# Patient Record
Sex: Female | Born: 1940
Health system: Southern US, Community
[De-identification: ages and names within clinical notes are randomized; demographics above are authoritative.]

## PROBLEM LIST (undated history)

## (undated) ENCOUNTER — Emergency Department (HOSPITAL_COMMUNITY): Admission: EM | Payer: Self-pay | Source: Home / Self Care

## (undated) DIAGNOSIS — I739 Peripheral vascular disease, unspecified: Secondary | ICD-10-CM

## (undated) DIAGNOSIS — I4819 Other persistent atrial fibrillation: Secondary | ICD-10-CM

## (undated) DIAGNOSIS — I340 Nonrheumatic mitral (valve) insufficiency: Secondary | ICD-10-CM

## (undated) DIAGNOSIS — E079 Disorder of thyroid, unspecified: Secondary | ICD-10-CM

## (undated) DIAGNOSIS — E785 Hyperlipidemia, unspecified: Secondary | ICD-10-CM

## (undated) DIAGNOSIS — I10 Essential (primary) hypertension: Secondary | ICD-10-CM

## (undated) DIAGNOSIS — R011 Cardiac murmur, unspecified: Secondary | ICD-10-CM

## (undated) DIAGNOSIS — E119 Type 2 diabetes mellitus without complications: Secondary | ICD-10-CM

## (undated) DIAGNOSIS — E039 Hypothyroidism, unspecified: Secondary | ICD-10-CM

## (undated) DIAGNOSIS — G8929 Other chronic pain: Secondary | ICD-10-CM

## (undated) DIAGNOSIS — E669 Obesity, unspecified: Secondary | ICD-10-CM

## (undated) DIAGNOSIS — I5032 Chronic diastolic (congestive) heart failure: Secondary | ICD-10-CM

## (undated) DIAGNOSIS — K219 Gastro-esophageal reflux disease without esophagitis: Secondary | ICD-10-CM

## (undated) DIAGNOSIS — M1990 Unspecified osteoarthritis, unspecified site: Secondary | ICD-10-CM

## (undated) DIAGNOSIS — G4733 Obstructive sleep apnea (adult) (pediatric): Secondary | ICD-10-CM

## (undated) DIAGNOSIS — Q211 Atrial septal defect, unspecified: Secondary | ICD-10-CM

## (undated) DIAGNOSIS — R079 Chest pain, unspecified: Secondary | ICD-10-CM

## (undated) DIAGNOSIS — I499 Cardiac arrhythmia, unspecified: Secondary | ICD-10-CM

## (undated) DIAGNOSIS — K279 Peptic ulcer, site unspecified, unspecified as acute or chronic, without hemorrhage or perforation: Secondary | ICD-10-CM

## (undated) DIAGNOSIS — I272 Pulmonary hypertension, unspecified: Secondary | ICD-10-CM

## (undated) DIAGNOSIS — I071 Rheumatic tricuspid insufficiency: Secondary | ICD-10-CM

## (undated) HISTORY — DX: Hyperlipidemia, unspecified: E78.5

## (undated) HISTORY — DX: Peripheral vascular disease, unspecified: I73.9

## (undated) HISTORY — DX: Essential (primary) hypertension: I10

## (undated) HISTORY — DX: Hypothyroidism, unspecified: E03.9

## (undated) HISTORY — PX: TUBAL LIGATION: SHX77

## (undated) HISTORY — DX: Unspecified osteoarthritis, unspecified site: M19.90

## (undated) HISTORY — DX: Other persistent atrial fibrillation: I48.19

## (undated) HISTORY — DX: Rheumatic tricuspid insufficiency: I07.1

## (undated) HISTORY — DX: Type 2 diabetes mellitus without complications: E11.9

## (undated) HISTORY — DX: Other chronic pain: G89.29

## (undated) HISTORY — DX: Obstructive sleep apnea (adult) (pediatric): G47.33

## (undated) HISTORY — DX: Obesity, unspecified: E66.9

## (undated) HISTORY — DX: Chest pain, unspecified: R07.9

## (undated) HISTORY — DX: Nonrheumatic mitral (valve) insufficiency: I34.0

## (undated) HISTORY — PX: TOTAL KNEE ARTHROPLASTY: SHX125

## (undated) HISTORY — DX: Cardiac murmur, unspecified: R01.1

## (undated) HISTORY — DX: Chronic diastolic (congestive) heart failure: I50.32

## (undated) HISTORY — DX: Pulmonary hypertension, unspecified: I27.20

## (undated) HISTORY — DX: Atrial septal defect, unspecified: Q21.10

---
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
## 2004-09-03 ENCOUNTER — Ambulatory Visit (HOSPITAL_COMMUNITY): Admission: RE | Admit: 2004-09-03 | Discharge: 2004-09-03 | Payer: Self-pay | Admitting: Internal Medicine

## 2006-03-02 ENCOUNTER — Ambulatory Visit (HOSPITAL_COMMUNITY): Admission: RE | Admit: 2006-03-02 | Discharge: 2006-03-02 | Payer: Self-pay | Admitting: *Deleted

## 2007-06-17 ENCOUNTER — Ambulatory Visit (HOSPITAL_COMMUNITY): Admission: RE | Admit: 2007-06-17 | Discharge: 2007-06-17 | Payer: Self-pay | Admitting: Family Medicine

## 2008-01-10 ENCOUNTER — Ambulatory Visit (HOSPITAL_COMMUNITY): Admission: RE | Admit: 2008-01-10 | Discharge: 2008-01-10 | Payer: Self-pay | Admitting: Internal Medicine

## 2011-10-17 DIAGNOSIS — E119 Type 2 diabetes mellitus without complications: Secondary | ICD-10-CM | POA: Diagnosis not present

## 2011-10-17 DIAGNOSIS — M159 Polyosteoarthritis, unspecified: Secondary | ICD-10-CM | POA: Diagnosis not present

## 2011-10-17 DIAGNOSIS — E669 Obesity, unspecified: Secondary | ICD-10-CM | POA: Diagnosis not present

## 2012-01-23 DIAGNOSIS — M159 Polyosteoarthritis, unspecified: Secondary | ICD-10-CM | POA: Diagnosis not present

## 2012-01-23 DIAGNOSIS — E119 Type 2 diabetes mellitus without complications: Secondary | ICD-10-CM | POA: Diagnosis not present

## 2012-01-23 DIAGNOSIS — Z6841 Body Mass Index (BMI) 40.0 and over, adult: Secondary | ICD-10-CM | POA: Diagnosis not present

## 2012-01-23 DIAGNOSIS — G8929 Other chronic pain: Secondary | ICD-10-CM | POA: Diagnosis not present

## 2012-01-27 DIAGNOSIS — H409 Unspecified glaucoma: Secondary | ICD-10-CM | POA: Diagnosis not present

## 2012-01-27 DIAGNOSIS — H4011X0 Primary open-angle glaucoma, stage unspecified: Secondary | ICD-10-CM | POA: Diagnosis not present

## 2012-02-18 DIAGNOSIS — G4730 Sleep apnea, unspecified: Secondary | ICD-10-CM | POA: Diagnosis not present

## 2012-02-23 DIAGNOSIS — M1710 Unilateral primary osteoarthritis, unspecified knee: Secondary | ICD-10-CM | POA: Diagnosis not present

## 2012-03-23 DIAGNOSIS — H2510 Age-related nuclear cataract, unspecified eye: Secondary | ICD-10-CM | POA: Diagnosis not present

## 2012-03-23 DIAGNOSIS — H4011X0 Primary open-angle glaucoma, stage unspecified: Secondary | ICD-10-CM | POA: Diagnosis not present

## 2012-03-23 DIAGNOSIS — E119 Type 2 diabetes mellitus without complications: Secondary | ICD-10-CM | POA: Diagnosis not present

## 2012-03-23 DIAGNOSIS — H409 Unspecified glaucoma: Secondary | ICD-10-CM | POA: Diagnosis not present

## 2012-04-22 DIAGNOSIS — I10 Essential (primary) hypertension: Secondary | ICD-10-CM | POA: Diagnosis not present

## 2012-04-22 DIAGNOSIS — G4733 Obstructive sleep apnea (adult) (pediatric): Secondary | ICD-10-CM | POA: Diagnosis not present

## 2012-04-22 DIAGNOSIS — E782 Mixed hyperlipidemia: Secondary | ICD-10-CM | POA: Diagnosis not present

## 2012-04-28 DIAGNOSIS — E119 Type 2 diabetes mellitus without complications: Secondary | ICD-10-CM | POA: Diagnosis not present

## 2012-05-11 DIAGNOSIS — H10509 Unspecified blepharoconjunctivitis, unspecified eye: Secondary | ICD-10-CM | POA: Diagnosis not present

## 2012-05-19 DIAGNOSIS — F411 Generalized anxiety disorder: Secondary | ICD-10-CM | POA: Diagnosis not present

## 2012-05-19 DIAGNOSIS — Z6841 Body Mass Index (BMI) 40.0 and over, adult: Secondary | ICD-10-CM | POA: Diagnosis not present

## 2012-05-19 DIAGNOSIS — M159 Polyosteoarthritis, unspecified: Secondary | ICD-10-CM | POA: Diagnosis not present

## 2012-05-19 DIAGNOSIS — G8929 Other chronic pain: Secondary | ICD-10-CM | POA: Diagnosis not present

## 2012-07-28 DIAGNOSIS — E785 Hyperlipidemia, unspecified: Secondary | ICD-10-CM | POA: Diagnosis not present

## 2012-07-28 DIAGNOSIS — E119 Type 2 diabetes mellitus without complications: Secondary | ICD-10-CM | POA: Diagnosis not present

## 2012-07-28 DIAGNOSIS — G8929 Other chronic pain: Secondary | ICD-10-CM | POA: Diagnosis not present

## 2012-07-28 DIAGNOSIS — M159 Polyosteoarthritis, unspecified: Secondary | ICD-10-CM | POA: Diagnosis not present

## 2012-07-28 DIAGNOSIS — Z6841 Body Mass Index (BMI) 40.0 and over, adult: Secondary | ICD-10-CM | POA: Diagnosis not present

## 2012-08-25 DIAGNOSIS — G4733 Obstructive sleep apnea (adult) (pediatric): Secondary | ICD-10-CM | POA: Diagnosis not present

## 2012-08-25 DIAGNOSIS — R269 Unspecified abnormalities of gait and mobility: Secondary | ICD-10-CM | POA: Diagnosis not present

## 2012-08-25 DIAGNOSIS — M159 Polyosteoarthritis, unspecified: Secondary | ICD-10-CM | POA: Diagnosis not present

## 2012-09-10 ENCOUNTER — Other Ambulatory Visit: Payer: Self-pay

## 2012-09-10 DIAGNOSIS — G4700 Insomnia, unspecified: Secondary | ICD-10-CM

## 2012-09-12 ENCOUNTER — Ambulatory Visit: Payer: Medicare Other | Attending: Neurology | Admitting: Sleep Medicine

## 2012-09-12 DIAGNOSIS — G4700 Insomnia, unspecified: Secondary | ICD-10-CM

## 2012-09-12 DIAGNOSIS — G4730 Sleep apnea, unspecified: Secondary | ICD-10-CM | POA: Diagnosis not present

## 2012-09-12 DIAGNOSIS — G4733 Obstructive sleep apnea (adult) (pediatric): Secondary | ICD-10-CM | POA: Diagnosis not present

## 2012-09-12 DIAGNOSIS — Z6841 Body Mass Index (BMI) 40.0 and over, adult: Secondary | ICD-10-CM | POA: Insufficient documentation

## 2012-09-16 NOTE — Procedures (Signed)
[facility] Ajax Schroll A. Gerilyn Pilgrim, MD     [email or web address]        NAME:  Christina Frey, Christina Frey               ACCOUNT NO.:  [id number]  MEDICAL RECORD NO.:  [id number]          PATIENT TYPE:  OUT  LOCATION:  SLEEP LAB                     FACILITY:  [facility]  PHYSICIAN:  Roen Macgowan A. Gerilyn Pilgrim, M.D. DATE OF BIRTH:  [birth year]  DATE OF STUDY:  09/12/2012                           NOCTURNAL POLYSOMNOGRAM  REFERRING PHYSICIAN:  Arzu Mcgaughey A. Gerilyn Pilgrim, M.D.  INDICATION:  This is a 72 year old lady who presents with snoring and fatigue.  The study is being done to evaluate for obstructive sleep apnea syndrome.   MEDICATIONS:  Levothyroxine, potassium, gabapentin, diazepam, enalapril, hydrocodone, Nexium, Vicodin, and Zetia.  EPWORTH SLEEPINESS SCALE:  6.  BMI:  43.  ARCHITECTURAL SUMMARY:  The total recording time is 415 minutes.  Sleep efficiency is 60%, sleep latency 12 minutes.  REM latency 360 minutes. Stage N1 -- 13%, N2 -- 74%, N3 -- 6%, and REM sleep 7%.  RESPIRATORY SUMMARY:  Baseline oxygen saturation is 93, lowest saturation 77 during REM sleep.  Diagnostic AHI is 6.5 and RDI 7.2.  LIMB MOVEMENT SUMMARY:  PLM index is 0.  ELECTROCARDIOGRAM SUMMARY:  Average heart rate is 62 with no significant dysrhythmias observed.  IMPRESSION: 1. Mild obstructive sleep apnea syndrome, not requiring positive     pressure treatment. 2. Abnormal architecture with reduced sleep efficiency, increased     stage II sleep, and reduced stage N3 sleep.    Railee Bonillas A. Gerilyn Pilgrim, M.D.    KAD/MEDQ  D:  09/16/2012 08:42:52  T:  09/16/2012 09:11:10  Job:  [id number]

## 2012-09-29 DIAGNOSIS — G478 Other sleep disorders: Secondary | ICD-10-CM | POA: Diagnosis not present

## 2012-11-18 DIAGNOSIS — Z6841 Body Mass Index (BMI) 40.0 and over, adult: Secondary | ICD-10-CM | POA: Diagnosis not present

## 2012-11-18 DIAGNOSIS — F411 Generalized anxiety disorder: Secondary | ICD-10-CM | POA: Diagnosis not present

## 2012-11-18 DIAGNOSIS — G8929 Other chronic pain: Secondary | ICD-10-CM | POA: Diagnosis not present

## 2012-11-29 DIAGNOSIS — M1710 Unilateral primary osteoarthritis, unspecified knee: Secondary | ICD-10-CM | POA: Diagnosis not present

## 2012-11-30 DIAGNOSIS — E119 Type 2 diabetes mellitus without complications: Secondary | ICD-10-CM | POA: Diagnosis not present

## 2012-11-30 DIAGNOSIS — F411 Generalized anxiety disorder: Secondary | ICD-10-CM | POA: Diagnosis not present

## 2012-11-30 DIAGNOSIS — G8929 Other chronic pain: Secondary | ICD-10-CM | POA: Diagnosis not present

## 2012-11-30 DIAGNOSIS — Z6841 Body Mass Index (BMI) 40.0 and over, adult: Secondary | ICD-10-CM | POA: Diagnosis not present

## 2013-01-17 DIAGNOSIS — Z6841 Body Mass Index (BMI) 40.0 and over, adult: Secondary | ICD-10-CM | POA: Diagnosis not present

## 2013-01-17 DIAGNOSIS — J309 Allergic rhinitis, unspecified: Secondary | ICD-10-CM | POA: Diagnosis not present

## 2013-01-17 DIAGNOSIS — L259 Unspecified contact dermatitis, unspecified cause: Secondary | ICD-10-CM | POA: Diagnosis not present

## 2013-01-20 ENCOUNTER — Other Ambulatory Visit: Payer: Self-pay | Admitting: *Deleted

## 2013-01-20 MED ORDER — NEBIVOLOL HCL 5 MG PO TABS: 5.0000 mg | ORAL_TABLET | Freq: Every day | ORAL | Status: DC

## 2013-02-02 DIAGNOSIS — G478 Other sleep disorders: Secondary | ICD-10-CM | POA: Diagnosis not present

## 2013-02-02 DIAGNOSIS — G479 Sleep disorder, unspecified: Secondary | ICD-10-CM | POA: Diagnosis not present

## 2013-04-05 DIAGNOSIS — H409 Unspecified glaucoma: Secondary | ICD-10-CM | POA: Diagnosis not present

## 2013-04-05 DIAGNOSIS — Z961 Presence of intraocular lens: Secondary | ICD-10-CM | POA: Diagnosis not present

## 2013-04-05 DIAGNOSIS — H2510 Age-related nuclear cataract, unspecified eye: Secondary | ICD-10-CM | POA: Diagnosis not present

## 2013-04-05 DIAGNOSIS — H4011X0 Primary open-angle glaucoma, stage unspecified: Secondary | ICD-10-CM | POA: Diagnosis not present

## 2013-04-05 DIAGNOSIS — E119 Type 2 diabetes mellitus without complications: Secondary | ICD-10-CM | POA: Diagnosis not present

## 2013-04-14 DIAGNOSIS — E119 Type 2 diabetes mellitus without complications: Secondary | ICD-10-CM | POA: Diagnosis not present

## 2013-04-14 DIAGNOSIS — E785 Hyperlipidemia, unspecified: Secondary | ICD-10-CM | POA: Diagnosis not present

## 2013-04-14 DIAGNOSIS — M159 Polyosteoarthritis, unspecified: Secondary | ICD-10-CM | POA: Diagnosis not present

## 2013-04-14 DIAGNOSIS — Z23 Encounter for immunization: Secondary | ICD-10-CM | POA: Diagnosis not present

## 2013-04-14 DIAGNOSIS — I10 Essential (primary) hypertension: Secondary | ICD-10-CM | POA: Diagnosis not present

## 2013-04-14 DIAGNOSIS — Z6841 Body Mass Index (BMI) 40.0 and over, adult: Secondary | ICD-10-CM | POA: Diagnosis not present

## 2013-04-14 DIAGNOSIS — G8929 Other chronic pain: Secondary | ICD-10-CM | POA: Diagnosis not present

## 2013-04-22 DIAGNOSIS — M1710 Unilateral primary osteoarthritis, unspecified knee: Secondary | ICD-10-CM | POA: Diagnosis not present

## 2013-04-22 DIAGNOSIS — Z96659 Presence of unspecified artificial knee joint: Secondary | ICD-10-CM | POA: Diagnosis not present

## 2013-04-29 ENCOUNTER — Encounter: Payer: Self-pay | Admitting: Cardiovascular Disease

## 2013-05-02 ENCOUNTER — Ambulatory Visit: Payer: Medicare Other | Admitting: Cardiovascular Disease

## 2013-05-02 ENCOUNTER — Encounter: Payer: Self-pay | Admitting: Cardiovascular Disease

## 2013-05-02 ENCOUNTER — Ambulatory Visit (INDEPENDENT_AMBULATORY_CARE_PROVIDER_SITE_OTHER): Payer: Medicare Other | Admitting: Cardiovascular Disease

## 2013-05-02 VITALS — BP 138/96 | HR 62 | Ht 60.0 in | Wt 229.0 lb

## 2013-05-02 DIAGNOSIS — E785 Hyperlipidemia, unspecified: Secondary | ICD-10-CM

## 2013-05-02 DIAGNOSIS — Z79899 Other long term (current) drug therapy: Secondary | ICD-10-CM

## 2013-05-02 DIAGNOSIS — G4733 Obstructive sleep apnea (adult) (pediatric): Secondary | ICD-10-CM

## 2013-05-02 DIAGNOSIS — I10 Essential (primary) hypertension: Secondary | ICD-10-CM | POA: Diagnosis not present

## 2013-05-02 NOTE — Assessment & Plan Note (Signed)
Not on statin therapy. We'll we will recheck a lipid and liver profile

## 2013-05-02 NOTE — Patient Instructions (Signed)
  We will see you back in follow up in 1 year with Dr Berry  Dr Berry has ordered blood work to be done fasting.    

## 2013-05-02 NOTE — Assessment & Plan Note (Signed)
Slightly elevated today but for the most part well controlled on current medications

## 2013-05-02 NOTE — Progress Notes (Signed)
05/02/2013 Christina Frey Christina Frey   1941-06-17  [id number]  Primary Physician Cassell Smiles., MD Primary Cardiologist: Runell Gess MD [name]   HPI:  The patient is a 72 year old moderately overweight, widowed Caucasian female, mother of 2, grandmother to many grandchildren who I last saw a year ago. Her major complaints are of arthritic joint pain as well as being under a lot of pressure at home because of family health issues. Her past medical history is remarkable for treated hypertension, hyperlipidemia, and obstructive sleep apnea, unfortunately unable to wear CPAP. She denies chest pain or shortness of breath. Echo performed in 2008 was essentially normal with moderately sclerotic but not stenotic aortic valve and a Myoview stress test in 2007 was low risk as well. Her last lipid profile a year ago was acceptable for primary prevention.  Since I saw her a year ago she denies chest pain or shortness of breath. Her major complaints are arthritic.     Current Outpatient Prescriptions  Medication Sig Dispense Refill  . calcium carbonate (OS-CAL) 600 MG TABS Take 600 mg by mouth daily.      . cholecalciferol (VITAMIN D) 1000 UNITS tablet Take 1,000 Units by mouth daily.      . Cyanocobalamin (B-12) 2500 MCG TABS Take 1 tablet by mouth daily.      . diazepam (VALIUM) 10 MG tablet Take 10 mg by mouth as needed for anxiety.      . enalapril (VASOTEC) 2.5 MG tablet Take 2.5 mg by mouth daily.      Marland Kitchen esomeprazole (NEXIUM) 40 MG capsule Take 40 mg by mouth daily before breakfast.      . gabapentin (NEURONTIN) 100 MG capsule Take 100 mg by mouth at bedtime.      . hydrochlorothiazide (HYDRODIURIL) 25 MG tablet Take 25 mg by mouth daily.      Marland Kitchen HYDROcodone-acetaminophen (NORCO) 10-325 MG per tablet 1 tablet every 4 (four) hours as needed.       Marland Kitchen levothyroxine (SYNTHROID, LEVOTHROID) 100 MCG tablet Take 100 mcg by mouth daily before breakfast.      . nebivolol (BYSTOLIC) 5 MG  tablet Take 1 tablet (5 mg total) by mouth daily.  30 tablet  4  . Omega-3 Fatty Acids (FISH OIL) 1200 MG CAPS Take 1 capsule by mouth daily.      . potassium chloride SA (K-DUR,KLOR-CON) 20 MEQ tablet Take 20 mEq by mouth 2 (two) times daily.      . vitamin C (ASCORBIC ACID) 500 MG tablet Take 500 mg by mouth daily.       No current facility-administered medications for this visit.    No Known Allergies  History   Social History  . Marital Status: Widowed    Spouse Name: N/A    Number of Children: N/A  . Years of Education: N/A   Occupational History  . Not on file.   Social History Main Topics  . Smoking status: Never Smoker   . Smokeless tobacco: Not on file  . Alcohol Use: No  . Drug Use: Not on file  . Sexual Activity: Not on file   Other Topics Concern  . Not on file   Social History Narrative  . No narrative on file     Review of Systems: General: negative for chills, fever, night sweats or weight changes.  Cardiovascular: negative for chest pain, dyspnea on exertion, edema, orthopnea, palpitations, paroxysmal nocturnal dyspnea or shortness of breath Dermatological: negative for rash Respiratory: negative for  cough or wheezing Urologic: negative for hematuria Abdominal: negative for nausea, vomiting, diarrhea, bright red blood per rectum, melena, or hematemesis Neurologic: negative for visual changes, syncope, or dizziness All other systems reviewed and are otherwise negative except as noted above.    Blood pressure 138/96, pulse 62, height 5' (1.524 m), weight 229 lb (103.874 kg).  General appearance: alert and no distress Neck: no adenopathy, no carotid bruit, no JVD, supple, symmetrical, trachea midline and thyroid not enlarged, symmetric, no tenderness/mass/nodules Lungs: clear to auscultation bilaterally Heart: regular rate and rhythm, S1, S2 normal, no murmur, click, rub or gallop Extremities: extremities normal, atraumatic, no cyanosis or edema  EKG  normal sinus rhythm at 62 without ST or T wave changes  ASSESSMENT AND PLAN:   Essential hypertension Slightly elevated today but for the most part well controlled on current medications  Hyperlipidemia Not on statin therapy. We'll we will recheck a lipid and liver profile      Runell Gess MD [facility], [facility] 05/02/2013 11:43 AM

## 2013-05-03 DIAGNOSIS — M1710 Unilateral primary osteoarthritis, unspecified knee: Secondary | ICD-10-CM | POA: Diagnosis not present

## 2013-05-13 DIAGNOSIS — M1710 Unilateral primary osteoarthritis, unspecified knee: Secondary | ICD-10-CM | POA: Diagnosis not present

## 2013-06-17 ENCOUNTER — Other Ambulatory Visit: Payer: Self-pay | Admitting: *Deleted

## 2013-06-17 MED ORDER — NEBIVOLOL HCL 5 MG PO TABS: 5.0000 mg | ORAL_TABLET | Freq: Every day | ORAL | Status: DC

## 2013-08-08 DIAGNOSIS — E785 Hyperlipidemia, unspecified: Secondary | ICD-10-CM | POA: Diagnosis not present

## 2013-08-08 DIAGNOSIS — N182 Chronic kidney disease, stage 2 (mild): Secondary | ICD-10-CM | POA: Diagnosis not present

## 2013-08-08 DIAGNOSIS — Z6841 Body Mass Index (BMI) 40.0 and over, adult: Secondary | ICD-10-CM | POA: Diagnosis not present

## 2013-08-08 DIAGNOSIS — E1129 Type 2 diabetes mellitus with other diabetic kidney complication: Secondary | ICD-10-CM | POA: Diagnosis not present

## 2013-08-08 DIAGNOSIS — I10 Essential (primary) hypertension: Secondary | ICD-10-CM | POA: Diagnosis not present

## 2013-08-09 DIAGNOSIS — E1129 Type 2 diabetes mellitus with other diabetic kidney complication: Secondary | ICD-10-CM | POA: Diagnosis not present

## 2013-08-09 DIAGNOSIS — N182 Chronic kidney disease, stage 2 (mild): Secondary | ICD-10-CM | POA: Diagnosis not present

## 2013-08-09 DIAGNOSIS — N390 Urinary tract infection, site not specified: Secondary | ICD-10-CM | POA: Diagnosis not present

## 2013-08-09 DIAGNOSIS — I10 Essential (primary) hypertension: Secondary | ICD-10-CM | POA: Diagnosis not present

## 2013-08-09 DIAGNOSIS — E669 Obesity, unspecified: Secondary | ICD-10-CM | POA: Diagnosis not present

## 2013-09-06 ENCOUNTER — Other Ambulatory Visit: Payer: Self-pay | Admitting: Cardiovascular Disease

## 2013-09-06 NOTE — Telephone Encounter (Signed)
Rx was sent to pharmacy electronically. 

## 2013-09-19 DIAGNOSIS — Z96659 Presence of unspecified artificial knee joint: Secondary | ICD-10-CM | POA: Diagnosis not present

## 2013-09-19 DIAGNOSIS — IMO0002 Reserved for concepts with insufficient information to code with codable children: Secondary | ICD-10-CM | POA: Diagnosis not present

## 2013-09-19 DIAGNOSIS — M1710 Unilateral primary osteoarthritis, unspecified knee: Secondary | ICD-10-CM | POA: Diagnosis not present

## 2013-12-09 DIAGNOSIS — J329 Chronic sinusitis, unspecified: Secondary | ICD-10-CM | POA: Diagnosis not present

## 2013-12-09 DIAGNOSIS — G8929 Other chronic pain: Secondary | ICD-10-CM | POA: Diagnosis not present

## 2013-12-09 DIAGNOSIS — E119 Type 2 diabetes mellitus without complications: Secondary | ICD-10-CM | POA: Diagnosis not present

## 2013-12-09 DIAGNOSIS — Z6841 Body Mass Index (BMI) 40.0 and over, adult: Secondary | ICD-10-CM | POA: Diagnosis not present

## 2013-12-09 DIAGNOSIS — M1990 Unspecified osteoarthritis, unspecified site: Secondary | ICD-10-CM | POA: Diagnosis not present

## 2013-12-19 DIAGNOSIS — IMO0002 Reserved for concepts with insufficient information to code with codable children: Secondary | ICD-10-CM | POA: Diagnosis not present

## 2013-12-19 DIAGNOSIS — M1710 Unilateral primary osteoarthritis, unspecified knee: Secondary | ICD-10-CM | POA: Diagnosis not present

## 2014-01-09 DIAGNOSIS — IMO0002 Reserved for concepts with insufficient information to code with codable children: Secondary | ICD-10-CM | POA: Diagnosis not present

## 2014-01-09 DIAGNOSIS — M1710 Unilateral primary osteoarthritis, unspecified knee: Secondary | ICD-10-CM | POA: Diagnosis not present

## 2014-01-16 DIAGNOSIS — IMO0002 Reserved for concepts with insufficient information to code with codable children: Secondary | ICD-10-CM | POA: Diagnosis not present

## 2014-01-16 DIAGNOSIS — M1710 Unilateral primary osteoarthritis, unspecified knee: Secondary | ICD-10-CM | POA: Diagnosis not present

## 2014-01-23 DIAGNOSIS — IMO0002 Reserved for concepts with insufficient information to code with codable children: Secondary | ICD-10-CM | POA: Diagnosis not present

## 2014-01-23 DIAGNOSIS — M1710 Unilateral primary osteoarthritis, unspecified knee: Secondary | ICD-10-CM | POA: Diagnosis not present

## 2014-03-03 DIAGNOSIS — G8929 Other chronic pain: Secondary | ICD-10-CM | POA: Diagnosis not present

## 2014-03-03 DIAGNOSIS — E119 Type 2 diabetes mellitus without complications: Secondary | ICD-10-CM | POA: Diagnosis not present

## 2014-03-03 DIAGNOSIS — M549 Dorsalgia, unspecified: Secondary | ICD-10-CM | POA: Diagnosis not present

## 2014-03-03 DIAGNOSIS — Z6841 Body Mass Index (BMI) 40.0 and over, adult: Secondary | ICD-10-CM | POA: Diagnosis not present

## 2014-03-03 DIAGNOSIS — E785 Hyperlipidemia, unspecified: Secondary | ICD-10-CM | POA: Diagnosis not present

## 2014-03-09 DIAGNOSIS — E785 Hyperlipidemia, unspecified: Secondary | ICD-10-CM | POA: Diagnosis not present

## 2014-03-09 DIAGNOSIS — E119 Type 2 diabetes mellitus without complications: Secondary | ICD-10-CM | POA: Diagnosis not present

## 2014-03-09 DIAGNOSIS — I10 Essential (primary) hypertension: Secondary | ICD-10-CM | POA: Diagnosis not present

## 2014-04-15 ENCOUNTER — Emergency Department (HOSPITAL_COMMUNITY)
Admission: EM | Admit: 2014-04-15 | Discharge: 2014-04-16 | Disposition: A | Discharge status: Home / Self Care | Payer: Medicare Other | Attending: Emergency Medicine | Admitting: Emergency Medicine

## 2014-04-15 ENCOUNTER — Encounter (HOSPITAL_COMMUNITY): Payer: Self-pay | Admitting: Emergency Medicine

## 2014-04-15 DIAGNOSIS — Z9981 Dependence on supplemental oxygen: Secondary | ICD-10-CM | POA: Diagnosis not present

## 2014-04-15 DIAGNOSIS — R011 Cardiac murmur, unspecified: Secondary | ICD-10-CM | POA: Insufficient documentation

## 2014-04-15 DIAGNOSIS — G4733 Obstructive sleep apnea (adult) (pediatric): Secondary | ICD-10-CM | POA: Diagnosis not present

## 2014-04-15 DIAGNOSIS — S4980XA Other specified injuries of shoulder and upper arm, unspecified arm, initial encounter: Secondary | ICD-10-CM | POA: Insufficient documentation

## 2014-04-15 DIAGNOSIS — Y92009 Unspecified place in unspecified non-institutional (private) residence as the place of occurrence of the external cause: Secondary | ICD-10-CM | POA: Insufficient documentation

## 2014-04-15 DIAGNOSIS — I10 Essential (primary) hypertension: Secondary | ICD-10-CM | POA: Diagnosis not present

## 2014-04-15 DIAGNOSIS — Y9389 Activity, other specified: Secondary | ICD-10-CM | POA: Insufficient documentation

## 2014-04-15 DIAGNOSIS — G8929 Other chronic pain: Secondary | ICD-10-CM

## 2014-04-15 DIAGNOSIS — S99919A Unspecified injury of unspecified ankle, initial encounter: Secondary | ICD-10-CM

## 2014-04-15 DIAGNOSIS — M549 Dorsalgia, unspecified: Secondary | ICD-10-CM | POA: Diagnosis not present

## 2014-04-15 DIAGNOSIS — S8990XA Unspecified injury of unspecified lower leg, initial encounter: Secondary | ICD-10-CM | POA: Diagnosis not present

## 2014-04-15 DIAGNOSIS — W1809XA Striking against other object with subsequent fall, initial encounter: Secondary | ICD-10-CM | POA: Insufficient documentation

## 2014-04-15 DIAGNOSIS — E860 Dehydration: Secondary | ICD-10-CM | POA: Insufficient documentation

## 2014-04-15 DIAGNOSIS — Z79899 Other long term (current) drug therapy: Secondary | ICD-10-CM | POA: Diagnosis not present

## 2014-04-15 DIAGNOSIS — S40019A Contusion of unspecified shoulder, initial encounter: Secondary | ICD-10-CM | POA: Insufficient documentation

## 2014-04-15 DIAGNOSIS — S46909A Unspecified injury of unspecified muscle, fascia and tendon at shoulder and upper arm level, unspecified arm, initial encounter: Secondary | ICD-10-CM | POA: Diagnosis not present

## 2014-04-15 DIAGNOSIS — M546 Pain in thoracic spine: Secondary | ICD-10-CM

## 2014-04-15 DIAGNOSIS — M25569 Pain in unspecified knee: Secondary | ICD-10-CM | POA: Diagnosis not present

## 2014-04-15 DIAGNOSIS — M25562 Pain in left knee: Secondary | ICD-10-CM

## 2014-04-15 DIAGNOSIS — S298XXA Other specified injuries of thorax, initial encounter: Secondary | ICD-10-CM | POA: Diagnosis not present

## 2014-04-15 DIAGNOSIS — W19XXXA Unspecified fall, initial encounter: Secondary | ICD-10-CM

## 2014-04-15 DIAGNOSIS — S40011A Contusion of right shoulder, initial encounter: Secondary | ICD-10-CM

## 2014-04-15 DIAGNOSIS — S99929A Unspecified injury of unspecified foot, initial encounter: Secondary | ICD-10-CM

## 2014-04-15 DIAGNOSIS — IMO0002 Reserved for concepts with insufficient information to code with codable children: Secondary | ICD-10-CM | POA: Diagnosis not present

## 2014-04-15 DIAGNOSIS — R079 Chest pain, unspecified: Secondary | ICD-10-CM | POA: Diagnosis not present

## 2014-04-15 DIAGNOSIS — IMO0001 Reserved for inherently not codable concepts without codable children: Secondary | ICD-10-CM

## 2014-04-15 DIAGNOSIS — S40021A Contusion of right upper arm, initial encounter: Secondary | ICD-10-CM

## 2014-04-15 NOTE — ED Notes (Signed)
Pt states she fell Friday night while trying to get into the bed, has bruising and pain to left shoulder and arm, also pain all over from the fall.

## 2014-04-16 ENCOUNTER — Emergency Department (HOSPITAL_COMMUNITY): Payer: Medicare Other

## 2014-04-16 DIAGNOSIS — S46909A Unspecified injury of unspecified muscle, fascia and tendon at shoulder and upper arm level, unspecified arm, initial encounter: Secondary | ICD-10-CM | POA: Diagnosis not present

## 2014-04-16 DIAGNOSIS — S4980XA Other specified injuries of shoulder and upper arm, unspecified arm, initial encounter: Secondary | ICD-10-CM | POA: Diagnosis not present

## 2014-04-16 DIAGNOSIS — IMO0002 Reserved for concepts with insufficient information to code with codable children: Secondary | ICD-10-CM | POA: Diagnosis not present

## 2014-04-16 DIAGNOSIS — M549 Dorsalgia, unspecified: Secondary | ICD-10-CM | POA: Diagnosis not present

## 2014-04-16 DIAGNOSIS — R079 Chest pain, unspecified: Secondary | ICD-10-CM | POA: Diagnosis not present

## 2014-04-16 DIAGNOSIS — S298XXA Other specified injuries of thorax, initial encounter: Secondary | ICD-10-CM | POA: Diagnosis not present

## 2014-04-16 LAB — CBC WITH DIFFERENTIAL/PLATELET
Basophils Absolute: 0 10*3/uL (ref 0.0–0.1)
Basophils Relative: 0 % (ref 0–1)
EOS ABS: 0 10*3/uL (ref 0.0–0.7)
Eosinophils Relative: 0 % (ref 0–5)
HCT: 37.1 % (ref 36.0–46.0)
HEMOGLOBIN: 12.3 g/dL (ref 12.0–15.0)
LYMPHS ABS: 1.5 10*3/uL (ref 0.7–4.0)
LYMPHS PCT: 13 % (ref 12–46)
MCH: 29.9 pg (ref 26.0–34.0)
MCHC: 33.2 g/dL (ref 30.0–36.0)
MCV: 90.3 fL (ref 78.0–100.0)
MONOS PCT: 5 % (ref 3–12)
Monocytes Absolute: 0.6 10*3/uL (ref 0.1–1.0)
NEUTROS PCT: 82 % — AB (ref 43–77)
Neutro Abs: 9.4 10*3/uL — ABNORMAL HIGH (ref 1.7–7.7)
Platelets: 220 10*3/uL (ref 150–400)
RBC: 4.11 MIL/uL (ref 3.87–5.11)
RDW: 13 % (ref 11.5–15.5)
WBC: 11.5 10*3/uL — AB (ref 4.0–10.5)

## 2014-04-16 LAB — URINALYSIS, ROUTINE W REFLEX MICROSCOPIC
Bilirubin Urine: NEGATIVE
GLUCOSE, UA: NEGATIVE mg/dL
Ketones, ur: 40 mg/dL — AB
Nitrite: NEGATIVE
PH: 6 (ref 5.0–8.0)
PROTEIN: NEGATIVE mg/dL
SPECIFIC GRAVITY, URINE: 1.02 (ref 1.005–1.030)
Urobilinogen, UA: 0.2 mg/dL (ref 0.0–1.0)

## 2014-04-16 LAB — COMPREHENSIVE METABOLIC PANEL
ALBUMIN: 3.6 g/dL (ref 3.5–5.2)
ALT: 13 U/L (ref 0–35)
AST: 35 U/L (ref 0–37)
Alkaline Phosphatase: 92 U/L (ref 39–117)
Anion gap: 16 — ABNORMAL HIGH (ref 5–15)
BUN: 11 mg/dL (ref 6–23)
CALCIUM: 9.6 mg/dL (ref 8.4–10.5)
CHLORIDE: 100 meq/L (ref 96–112)
CO2: 22 meq/L (ref 19–32)
Creatinine, Ser: 0.7 mg/dL (ref 0.50–1.10)
GFR calc Af Amer: >90 mL/min (ref >90)
GFR calc non Af Amer: 83 mL/min — ABNORMAL LOW (ref >90)
Glucose, Bld: 138 mg/dL — ABNORMAL HIGH (ref 70–99)
POTASSIUM: 3.6 meq/L — AB (ref 3.7–5.3)
SODIUM: 138 meq/L (ref 137–147)
TOTAL PROTEIN: 7.2 g/dL (ref 6.0–8.3)
Total Bilirubin: 1 mg/dL (ref 0.3–1.2)

## 2014-04-16 LAB — URINE MICROSCOPIC-ADD ON

## 2014-04-16 LAB — CK: Total CK: 761 U/L — ABNORMAL HIGH (ref 7–177)

## 2014-04-16 MED ORDER — SODIUM CHLORIDE 0.9 % IV SOLN
1000.0000 mL | INTRAVENOUS | Status: DC
  Administered 2014-04-16: 1000 mL via INTRAVENOUS

## 2014-04-16 MED ORDER — FENTANYL CITRATE 0.05 MG/ML IJ SOLN
25.0000 ug | Freq: Once | INTRAMUSCULAR | Status: AC
  Administered 2014-04-16: 25 ug via INTRAVENOUS
  Filled 2014-04-16: qty 2

## 2014-04-16 MED ORDER — SODIUM CHLORIDE 0.9 % IV SOLN
1000.0000 mL | Freq: Once | INTRAVENOUS | Status: AC
  Administered 2014-04-15: 1000 mL via INTRAVENOUS

## 2014-04-16 NOTE — ED Provider Notes (Signed)
CSN: [id number]     Arrival date & time 04/15/14  2336 History   First MD Initiated Contact with Patient 04/16/14 0006   This chart was scribed for Ward Givens, MD by Gwenevere Abbot, ED scribe. This patient was seen in room [facility] and the patient's care was started at 12:26 AM.    Chief Complaint  Patient presents with  . Fall   The history is provided by the patient and a relative. No language interpreter was used.   HPI Comments:  Christina Frey is a 73 y.o. female who presents to the Emergency Department complaining of a fall. Pt states that she was getting into bed and she fell  of the bed between the bed and the dresser about 3:30 this morning. Pt states that she laid in the floor all day because she could not get up and she was unable to reach a phone. Her son came to bring her dinner about 8:30 PM and found her on the floor. She had tried to scoot around and get a follow and help her self up and she was unable to. Pt denies that she falls often, but that she takes a lot of medication that makes her dizzy. Pt denies LOC. Pt states that in trying to get up she hit her head on the left side on the door way. Pt states that she is experiencing pain in the left shoulder. Pt states that she has had a knee replacement in her right knee, and the other one is in need of replacement. She gets the injections in it. Patient states she is right-handed.  PCP Dr Sherwood Gambler Pt sees cardiologist Dr. Jeri Cos.  Orthopedist Dr Chaney Malling  Past Medical History  Diagnosis Date  . Claudication     LOWER ARTERIAL DUPLEX, 05/27/2006 - normal  . Chest pain, unspecified     NUCLEAR STRESS TEST, 04/23/2006 - low risk scan, normal  . Murmur     2D ECHO, 08/20/2007 - EF "normal" (not noted), mild-moderate mitral annular calcification, moderate thickening of the mitral valve, aortic valve moderately sclerotic  . Hypertension   . Hyperlipidemia   . OSA (obstructive sleep apnea)     Unable to wear CPAP    History reviewed. No pertinent past surgical history. Family History  Problem Relation Age of Onset  . ALS Brother   . Heart disease Brother   . Hypertension Brother   . Arthritis Brother   . Diabetes Sister    History  Substance Use Topics  . Smoking status: Never Smoker   . Smokeless tobacco: Not on file  . Alcohol Use: No   Lives alone, but son lives next door  OB History   Grav Para Term Preterm Abortions TAB SAB Ect Mult Living                 Review of Systems  Constitutional: Negative for fever and chills.  Musculoskeletal: Positive for arthralgias and myalgias.      Allergies  Review of patient's allergies indicates no known allergies.  Home Medications   Prior to Admission medications   Medication Sig Start Date End Date Taking? Authorizing Provider  calcium carbonate (OS-CAL) 600 MG TABS Take 600 mg by mouth daily.    Historical Provider, MD  cholecalciferol (VITAMIN D) 1000 UNITS tablet Take 1,000 Units by mouth daily.    Historical Provider, MD  Cyanocobalamin (B-12) 2500 MCG TABS Take 1 tablet by mouth daily.    Historical Provider, MD  diazepam (VALIUM) 10 MG tablet Take 10 mg by mouth as needed for anxiety.    Historical Provider, MD  enalapril (VASOTEC) 2.5 MG tablet Take 2.5 mg by mouth daily.    Historical Provider, MD  esomeprazole (NEXIUM) 40 MG capsule Take 40 mg by mouth daily before breakfast.    Historical Provider, MD  gabapentin (NEURONTIN) 100 MG capsule Take 100 mg by mouth at bedtime.    Historical Provider, MD  hydrochlorothiazide (HYDRODIURIL) 25 MG tablet Take 25 mg by mouth daily.    Historical Provider, MD  HYDROcodone-acetaminophen (NORCO) 10-325 MG per tablet 1 tablet every 4 (four) hours as needed.  04/17/13   Historical Provider, MD  levothyroxine (SYNTHROID, LEVOTHROID) 100 MCG tablet Take 100 mcg by mouth daily before breakfast.    Historical Provider, MD  nebivolol (BYSTOLIC) 5 MG tablet Take 1 tablet (5 mg total) by mouth  daily. 06/17/13   Runell Gess, MD  Omega-3 Fatty Acids (FISH OIL) 1200 MG CAPS Take 1 capsule by mouth daily.    Historical Provider, MD  potassium chloride SA (K-DUR,KLOR-CON) 20 MEQ tablet Take 1 tablet (20 mEq total) by mouth 2 (two) times daily. 09/06/13   Runell Gess, MD  vitamin C (ASCORBIC ACID) 500 MG tablet Take 500 mg by mouth daily.    Historical Provider, MD   BP 134/65  Pulse 72  Temp(Src) 98.1 F (36.7 C) (Oral)  Resp 18  Ht 5\' 3"  (1.6 m)  Wt 220 lb (99.791 kg)  BMI 38.98 kg/m2  SpO2 97%  Vital signs normal   Physical Exam  Nursing note and vitals reviewed. Constitutional: She is oriented to person, place, and time. She appears well-developed and well-nourished.  Non-toxic appearance. She does not appear ill. No distress.  HENT:  Head: Normocephalic and atraumatic.  Right Ear: External ear normal.  Left Ear: External ear normal.  Nose: Nose normal. No mucosal edema or rhinorrhea.  Mouth/Throat: Oropharynx is clear and moist and mucous membranes are normal. No dental abscesses or uvula swelling.  Eyes: Conjunctivae and EOM are normal. Pupils are equal, round, and reactive to light.  Neck: Normal range of motion and full passive range of motion without pain. Neck supple.  Cardiovascular: Normal rate and regular rhythm.  Exam reveals no gallop and no friction rub.   Murmur heard. Pulmonary/Chest: Effort normal and breath sounds normal. No respiratory distress. She has no wheezes. She has no rhonchi. She has no rales. She exhibits no tenderness and no crepitus.  Abdominal: Soft. Normal appearance and bowel sounds are normal. She exhibits no distension. There is no tenderness. There is no rebound and no guarding.  Musculoskeletal: Normal range of motion. She exhibits tenderness. She exhibits no edema.       Arms:      Legs: Tenderness on abduction of left shoulder. No pain in ROM of left wrist or elbow. Pt has tenderness on thoracic spine without localization.  Good ROM withno effusion in the left elbow. Diffuse enlargement of the left knee with loss of range of motion, but she states is her baseline.   Neurological: She is alert and oriented to person, place, and time. She has normal strength. No cranial nerve deficit.  Skin: Skin is warm, dry and intact. No rash noted. No erythema. No pallor.  Bruising of the posterior left upper arm and left posterior chest laterally.  Pt has a 4 cm bruise on the medial aspect of her right knee.  Psychiatric: She has a normal  mood and affect. Her speech is normal and behavior is normal. Her mood appears not anxious.    ED Course  Procedures   Medications  0.9 %  sodium chloride infusion (0 mLs Intravenous Stopped 04/16/14 0328)    Followed by  0.9 %  sodium chloride infusion (0 mLs Intravenous Stopped 04/16/14 0400)  fentaNYL (SUBLIMAZE) injection 25 mcg (25 mcg Intravenous Given 04/16/14 0050)    DIAGNOSTIC STUDIES: Oxygen Saturation is 97% on RA, adequate by my interpretation.  COORDINATION OF CARE: 12:40 AM-Discussed treatment plan with pt at bedside and pt agreed to plan.  Patient received 2 liters of IV fluid and she had good urinary output. She did not have rhabdomyolysis from lying on the floor all day. Patient and her son were given the results of her x-rays. She states she sees Dr. Chaney MallingMortenson for her knees. She can followup with him for her shoulder also. Patient wants to go home. She is going to stay with her son tonight. He states they are making a bedroom for her in his house so she will not be alone.  Results for orders placed during the hospital encounter of 04/15/14  CK      Result Value Ref Range   Total CK 761 (*) 7 - 177 U/L  CBC WITH DIFFERENTIAL      Result Value Ref Range   WBC 11.5 (*) 4.0 - 10.5 K/uL   RBC 4.11  3.87 - 5.11 MIL/uL   Hemoglobin 12.3  12.0 - 15.0 g/dL   HCT 16.137.1  09.636.0 - 04.546.0 %   MCV 90.3  78.0 - 100.0 fL   MCH 29.9  26.0 - 34.0 pg   MCHC 33.2  30.0 - 36.0 g/dL   RDW  40.913.0  81.111.5 - 91.415.5 %   Platelets 220  150 - 400 K/uL   Neutrophils Relative % 82 (*) 43 - 77 %   Neutro Abs 9.4 (*) 1.7 - 7.7 K/uL   Lymphocytes Relative 13  12 - 46 %   Lymphs Abs 1.5  0.7 - 4.0 K/uL   Monocytes Relative 5  3 - 12 %   Monocytes Absolute 0.6  0.1 - 1.0 K/uL   Eosinophils Relative 0  0 - 5 %   Eosinophils Absolute 0.0  0.0 - 0.7 K/uL   Basophils Relative 0  0 - 1 %   Basophils Absolute 0.0  0.0 - 0.1 K/uL  COMPREHENSIVE METABOLIC PANEL      Result Value Ref Range   Sodium 138  137 - 147 mEq/L   Potassium 3.6 (*) 3.7 - 5.3 mEq/L   Chloride 100  96 - 112 mEq/L   CO2 22  19 - 32 mEq/L   Glucose, Bld 138 (*) 70 - 99 mg/dL   BUN 11  6 - 23 mg/dL   Creatinine, Ser 7.820.70  0.50 - 1.10 mg/dL   Calcium 9.6  8.4 - 95.610.5 mg/dL   Total Protein 7.2  6.0 - 8.3 g/dL   Albumin 3.6  3.5 - 5.2 g/dL   AST 35  0 - 37 U/L   ALT 13  0 - 35 U/L   Alkaline Phosphatase 92  39 - 117 U/L   Total Bilirubin 1.0  0.3 - 1.2 mg/dL   GFR calc non Af Amer 83 (*) >90 mL/min   GFR calc Af Amer >90  >90 mL/min   Anion gap 16 (*) 5 - 15  URINALYSIS, ROUTINE W REFLEX MICROSCOPIC  Result Value Ref Range   Color, Urine YELLOW  YELLOW   APPearance CLEAR  CLEAR   Specific Gravity, Urine 1.020  1.005 - 1.030   pH 6.0  5.0 - 8.0   Glucose, UA NEGATIVE  NEGATIVE mg/dL   Hgb urine dipstick SMALL (*) NEGATIVE   Bilirubin Urine NEGATIVE  NEGATIVE   Ketones, ur 40 (*) NEGATIVE mg/dL   Protein, ur NEGATIVE  NEGATIVE mg/dL   Urobilinogen, UA 0.2  0.0 - 1.0 mg/dL   Nitrite NEGATIVE  NEGATIVE   Leukocytes, UA TRACE (*) NEGATIVE  URINE MICROSCOPIC-ADD ON      Result Value Ref Range   Squamous Epithelial / LPF FEW (*) RARE   WBC, UA 3-6  <3 WBC/hpf   RBC / HPF 0-2  <3 RBC/hpf   Bacteria, UA FEW (*) RARE   Laboratory interpretation all normal except leukocytosis   Dg Ribs Unilateral W/chest Left  04/16/2014   CLINICAL DATA:  Status post fall; on floor for 15 hours. Left shoulder pain and left posterior  upper rib pain. Mid upper back pain.  EXAM: LEFT RIBS AND CHEST - 3+ VIEW  COMPARISON:  Left shoulder radiographs performed 08/31/2008  FINDINGS: No displaced rib fractures are seen.  The lungs are well-aerated. Mild left-sided atelectasis is seen. There is no evidence of pleural effusion or pneumothorax.  The cardiomediastinal silhouette is significantly enlarged. Mild vascular congestion is noted. No acute osseous abnormalities are seen.  IMPRESSION: 1. Mild left-sided atelectasis noted. Lungs otherwise grossly clear. Mild vascular congestion and significant cardiomegaly seen. 2. No displaced rib fractures identified.   Electronically Signed   By: Roanna Raider M.D.   On: 04/16/2014 02:08   Dg Thoracic Spine W/swimmers  04/16/2014   CLINICAL DATA:  Status post fall; mid upper back pain.  EXAM: THORACIC SPINE - 2 VIEW + SWIMMERS  COMPARISON:  None.  FINDINGS: There is no evidence of fracture or subluxation. Vertebral bodies demonstrate normal height and alignment. Left convex thoracolumbar scoliosis is noted, with mild associated degenerative change. Multilevel disc space narrowing is noted along the upper lumbar spine.  The visualized portions of both lungs are clear. The mediastinum is unremarkable in appearance. Mild vascular congestion is again noted.  IMPRESSION: 1. No evidence of fracture or subluxation along the thoracic spine. 2. Left convex thoracolumbar scoliosis, with mild associated degenerative change.   Electronically Signed   By: Roanna Raider M.D.   On: 04/16/2014 02:13   Dg Shoulder Left  04/16/2014   CLINICAL DATA:  FALL  EXAM: LEFT SHOULDER - 2+ VIEW  COMPARISON:  Prior radiograph from 08/31/2008.  FINDINGS: Osseous fragment adjacent to the left humeral head demonstrates well corticated margins, and is favored to be chronic in nature. No other acute fracture. The humeral head is in normal alignment with the glenoid. AC joint is approximated. A prominent degenerative osteoarthrosis present  about the shoulder and left AC joint.  No soft tissue abnormality.  IMPRESSION: 1. No definite acute fracture or dislocation. 2. Osseous density at the lateral margin of the left humeral head. This finding demonstrates well corticated margins, and is favored to be chronic in nature.   Electronically Signed   By: Rise Mu M.D.   On: 04/16/2014 02:09       EKG Interpretation None      MDM   Final diagnoses:  None    I personally performed the services described in this documentation, which was scribed in my presence. The recorded information has  been reviewed and considered.  Devoria Albe, MD, FACEP      Ward Givens, MD 04/16/14 [phone]

## 2014-04-16 NOTE — Discharge Instructions (Signed)
Ice packs to the bruised areas. Take your pain medications as needed. Follow up with Dr Chaney Malling, your orthopedist this week.

## 2014-05-05 DIAGNOSIS — M1710 Unilateral primary osteoarthritis, unspecified knee: Secondary | ICD-10-CM | POA: Diagnosis not present

## 2014-05-05 DIAGNOSIS — IMO0002 Reserved for concepts with insufficient information to code with codable children: Secondary | ICD-10-CM | POA: Diagnosis not present

## 2014-05-05 DIAGNOSIS — Z96659 Presence of unspecified artificial knee joint: Secondary | ICD-10-CM | POA: Diagnosis not present

## 2014-05-16 DIAGNOSIS — H2510 Age-related nuclear cataract, unspecified eye: Secondary | ICD-10-CM | POA: Diagnosis not present

## 2014-05-16 DIAGNOSIS — Z961 Presence of intraocular lens: Secondary | ICD-10-CM | POA: Diagnosis not present

## 2014-05-16 DIAGNOSIS — H25019 Cortical age-related cataract, unspecified eye: Secondary | ICD-10-CM | POA: Diagnosis not present

## 2014-05-16 DIAGNOSIS — E119 Type 2 diabetes mellitus without complications: Secondary | ICD-10-CM | POA: Diagnosis not present

## 2014-05-17 ENCOUNTER — Other Ambulatory Visit: Payer: Self-pay | Admitting: Cardiovascular Disease

## 2014-05-17 NOTE — Telephone Encounter (Signed)
Rx was sent to pharmacy electronically. 

## 2014-06-26 ENCOUNTER — Other Ambulatory Visit: Payer: Self-pay | Admitting: Cardiovascular Disease

## 2014-06-26 NOTE — Telephone Encounter (Signed)
Rx was sent to pharmacy electronically. 

## 2014-07-08 ENCOUNTER — Observation Stay (HOSPITAL_COMMUNITY)
Admission: EM | Admit: 2014-07-08 | Discharge: 2014-07-08 | Disposition: A | Discharge status: Home / Self Care | Payer: Medicare Other | Attending: Emergency Medicine | Admitting: Emergency Medicine

## 2014-07-08 ENCOUNTER — Encounter (HOSPITAL_COMMUNITY): Payer: Self-pay | Admitting: Emergency Medicine

## 2014-07-08 ENCOUNTER — Emergency Department (HOSPITAL_COMMUNITY): Payer: Medicare Other

## 2014-07-08 DIAGNOSIS — K219 Gastro-esophageal reflux disease without esophagitis: Secondary | ICD-10-CM | POA: Diagnosis not present

## 2014-07-08 DIAGNOSIS — E785 Hyperlipidemia, unspecified: Secondary | ICD-10-CM | POA: Diagnosis not present

## 2014-07-08 DIAGNOSIS — Z8711 Personal history of peptic ulcer disease: Secondary | ICD-10-CM | POA: Diagnosis not present

## 2014-07-08 DIAGNOSIS — R079 Chest pain, unspecified: Secondary | ICD-10-CM | POA: Diagnosis not present

## 2014-07-08 DIAGNOSIS — Z23 Encounter for immunization: Secondary | ICD-10-CM | POA: Insufficient documentation

## 2014-07-08 DIAGNOSIS — K279 Peptic ulcer, site unspecified, unspecified as acute or chronic, without hemorrhage or perforation: Secondary | ICD-10-CM | POA: Diagnosis not present

## 2014-07-08 DIAGNOSIS — I739 Peripheral vascular disease, unspecified: Secondary | ICD-10-CM | POA: Insufficient documentation

## 2014-07-08 DIAGNOSIS — I10 Essential (primary) hypertension: Secondary | ICD-10-CM | POA: Diagnosis present

## 2014-07-08 DIAGNOSIS — I517 Cardiomegaly: Secondary | ICD-10-CM | POA: Diagnosis not present

## 2014-07-08 DIAGNOSIS — Z79899 Other long term (current) drug therapy: Secondary | ICD-10-CM | POA: Diagnosis not present

## 2014-07-08 DIAGNOSIS — R011 Cardiac murmur, unspecified: Secondary | ICD-10-CM | POA: Insufficient documentation

## 2014-07-08 DIAGNOSIS — R072 Precordial pain: Secondary | ICD-10-CM | POA: Diagnosis not present

## 2014-07-08 DIAGNOSIS — G4733 Obstructive sleep apnea (adult) (pediatric): Secondary | ICD-10-CM | POA: Diagnosis not present

## 2014-07-08 HISTORY — DX: Gastro-esophageal reflux disease without esophagitis: K21.9

## 2014-07-08 HISTORY — DX: Peptic ulcer, site unspecified, unspecified as acute or chronic, without hemorrhage or perforation: K27.9

## 2014-07-08 LAB — BASIC METABOLIC PANEL
ANION GAP: 12 (ref 5–15)
BUN: 13 mg/dL (ref 6–23)
CALCIUM: 9.9 mg/dL (ref 8.4–10.5)
CHLORIDE: 98 meq/L (ref 96–112)
CO2: 28 mEq/L (ref 19–32)
Creatinine, Ser: 0.73 mg/dL (ref 0.50–1.10)
GFR calc non Af Amer: 82 mL/min — ABNORMAL LOW (ref >90)
Glucose, Bld: 163 mg/dL — ABNORMAL HIGH (ref 70–99)
Potassium: 3.7 mEq/L (ref 3.7–5.3)
Sodium: 138 mEq/L (ref 137–147)

## 2014-07-08 LAB — HEPATIC FUNCTION PANEL
ALBUMIN: 3.6 g/dL (ref 3.5–5.2)
ALT: 24 U/L (ref 0–35)
AST: 49 U/L — ABNORMAL HIGH (ref 0–37)
Alkaline Phosphatase: 127 U/L — ABNORMAL HIGH (ref 39–117)
BILIRUBIN DIRECT: 0.4 mg/dL — AB (ref 0.0–0.3)
BILIRUBIN INDIRECT: 0.5 mg/dL (ref 0.3–0.9)
BILIRUBIN TOTAL: 0.9 mg/dL (ref 0.3–1.2)
Total Protein: 7.8 g/dL (ref 6.0–8.3)

## 2014-07-08 LAB — CBC WITH DIFFERENTIAL/PLATELET
Basophils Absolute: 0 10*3/uL (ref 0.0–0.1)
Basophils Relative: 0 % (ref 0–1)
Eosinophils Absolute: 0.1 10*3/uL (ref 0.0–0.7)
Eosinophils Relative: 1 % (ref 0–5)
HEMATOCRIT: 39 % (ref 36.0–46.0)
Hemoglobin: 12.7 g/dL (ref 12.0–15.0)
Lymphocytes Relative: 13 % (ref 12–46)
Lymphs Abs: 1.3 10*3/uL (ref 0.7–4.0)
MCH: 29.7 pg (ref 26.0–34.0)
MCHC: 32.6 g/dL (ref 30.0–36.0)
MCV: 91.1 fL (ref 78.0–100.0)
MONO ABS: 0.5 10*3/uL (ref 0.1–1.0)
Monocytes Relative: 5 % (ref 3–12)
NEUTROS ABS: 7.9 10*3/uL — AB (ref 1.7–7.7)
Neutrophils Relative %: 81 % — ABNORMAL HIGH (ref 43–77)
Platelets: 196 10*3/uL (ref 150–400)
RBC: 4.28 MIL/uL (ref 3.87–5.11)
RDW: 13.2 % (ref 11.5–15.5)
WBC: 9.6 10*3/uL (ref 4.0–10.5)

## 2014-07-08 LAB — TROPONIN I
Troponin I: <0.3 ng/mL (ref <0.30)
Troponin I: <0.3 ng/mL (ref <0.30)
Troponin I: <0.3 ng/mL (ref <0.30)

## 2014-07-08 LAB — LIPASE, BLOOD: LIPASE: 28 U/L (ref 11–59)

## 2014-07-08 MED ORDER — PANTOPRAZOLE SODIUM 40 MG PO TBEC
40.0000 mg | DELAYED_RELEASE_TABLET | Freq: Every day | ORAL | Status: DC
Start: 2014-07-08 — End: 2014-07-08
  Administered 2014-07-08: 40 mg via ORAL
  Filled 2014-07-08: qty 1

## 2014-07-08 MED ORDER — ISOSORBIDE MONONITRATE ER 30 MG PO TB24: 15.0000 mg | ORAL_TABLET | Freq: Every day | ORAL | Status: DC

## 2014-07-08 MED ORDER — POTASSIUM CHLORIDE CRYS ER 20 MEQ PO TBCR
20.0000 meq | EXTENDED_RELEASE_TABLET | Freq: Two times a day (BID) | ORAL | Status: DC
  Administered 2014-07-08: 20 meq via ORAL
  Filled 2014-07-08: qty 1

## 2014-07-08 MED ORDER — ENALAPRIL MALEATE 5 MG PO TABS
2.5000 mg | ORAL_TABLET | Freq: Every day | ORAL | Status: DC
  Administered 2014-07-08: 2.5 mg via ORAL
  Filled 2014-07-08: qty 1

## 2014-07-08 MED ORDER — HYDROCHLOROTHIAZIDE 25 MG PO TABS
25.0000 mg | ORAL_TABLET | Freq: Every day | ORAL | Status: DC
  Administered 2014-07-08: 25 mg via ORAL
  Filled 2014-07-08: qty 1

## 2014-07-08 MED ORDER — LEVOTHYROXINE SODIUM 100 MCG PO TABS
100.0000 ug | ORAL_TABLET | Freq: Every day | ORAL | Status: DC
  Administered 2014-07-08: 100 ug via ORAL
  Filled 2014-07-08: qty 1

## 2014-07-08 MED ORDER — GI COCKTAIL ~~LOC~~: 30.0000 mL | Freq: Four times a day (QID) | ORAL | Status: DC | PRN

## 2014-07-08 MED ORDER — INFLUENZA VAC SPLIT QUAD 0.5 ML IM SUSY
0.5000 mL | PREFILLED_SYRINGE | INTRAMUSCULAR | Status: AC
  Administered 2014-07-08: 0.5 mL via INTRAMUSCULAR
  Filled 2014-07-08: qty 0.5

## 2014-07-08 MED ORDER — ASPIRIN 81 MG PO CHEW
162.0000 mg | CHEWABLE_TABLET | Freq: Once | ORAL | Status: AC
  Administered 2014-07-08: 162 mg via ORAL
  Filled 2014-07-08: qty 2

## 2014-07-08 MED ORDER — NEBIVOLOL HCL 10 MG PO TABS
5.0000 mg | ORAL_TABLET | Freq: Every day | ORAL | Status: DC
Start: 2014-07-08 — End: 2014-07-08
  Administered 2014-07-08: 5 mg via ORAL
  Filled 2014-07-08: qty 1

## 2014-07-08 MED ORDER — ACETAMINOPHEN 325 MG PO TABS: 650.0000 mg | ORAL_TABLET | ORAL | Status: DC | PRN

## 2014-07-08 MED ORDER — ASPIRIN EC 325 MG PO TBEC
325.0000 mg | DELAYED_RELEASE_TABLET | Freq: Every day | ORAL | Status: DC
  Filled 2014-07-08: qty 1

## 2014-07-08 MED ORDER — GABAPENTIN 100 MG PO CAPS: 100.0000 mg | ORAL_CAPSULE | Freq: Every day | ORAL | Status: DC

## 2014-07-08 MED ORDER — HYDROCHLOROTHIAZIDE 25 MG PO TABS: 12.5000 mg | ORAL_TABLET | Freq: Every day | ORAL | Status: DC

## 2014-07-08 MED ORDER — VITAMIN C 500 MG PO TABS
500.0000 mg | ORAL_TABLET | Freq: Every day | ORAL | Status: DC
  Administered 2014-07-08: 500 mg via ORAL
  Filled 2014-07-08: qty 1

## 2014-07-08 MED ORDER — HYDROCODONE-ACETAMINOPHEN 10-325 MG PO TABS
1.0000 | ORAL_TABLET | ORAL | Status: DC | PRN
  Administered 2014-07-08: 1 via ORAL
  Filled 2014-07-08: qty 1

## 2014-07-08 MED ORDER — ISOSORBIDE MONONITRATE 15 MG HALF TABLET: 15.0000 mg | ORAL_TABLET | Freq: Every day | ORAL | Status: DC

## 2014-07-08 MED ORDER — ONDANSETRON HCL 4 MG/2ML IJ SOLN
4.0000 mg | Freq: Four times a day (QID) | INTRAMUSCULAR | Status: DC | PRN
Start: 2014-07-08 — End: 2014-07-08

## 2014-07-08 MED ORDER — VITAMIN D 1000 UNITS PO TABS
1000.0000 [IU] | ORAL_TABLET | Freq: Every day | ORAL | Status: DC
  Administered 2014-07-08: 1000 [IU] via ORAL
  Filled 2014-07-08: qty 1

## 2014-07-08 NOTE — Progress Notes (Signed)
Echocardiogram 2D Echocardiogram has been performed.  Christina Frey 07/08/2014, 1:08 PM

## 2014-07-08 NOTE — ED Notes (Signed)
Pt reports pain in middle of chest earlier tonight.  Reports that she went to sleep and woke up about 1am and was still experiencing pain.  Denies any pain at present time. Denies any nausea or vomiting.

## 2014-07-08 NOTE — Progress Notes (Signed)
Pt. Complaining of 10/10 chronic left knee pain.  Pt. States that Tylenol does not work for her.  Notified MD.  Will continue to monitor patient.

## 2014-07-08 NOTE — ED Notes (Signed)
Pt reports pain through the epigastric area starting last night before she went to bed. Pt reports taking Nexium and 2 GasX without relief after waking up in the night. Pt woke up with pain at roughly 2 this am. Son gave pt 2 baby ASA. Once pt arrived at ED, pt reports her pain went away. Denies any discomfort, N/V, SOB

## 2014-07-08 NOTE — ED Provider Notes (Signed)
CSN: [id number]     Arrival date & time 07/08/14  0246 History   First MD Initiated Contact with Patient 07/08/14 [phone]     Chief Complaint  Patient presents with  . Chest Pain     (Consider location/radiation/quality/duration/timing/severity/associated sxs/prior Treatment) HPI This is a 73 year old female with chest pain that began yesterday evening about 9 PM. She describes the chest pain is sharp, severe, located substernally. There was no radiation to the left with the right. She has never had a similar pain in the past. She did not tell anyone about the pain, but took a Nexium and went to bed. She awoke about 1 AM with persistent pain. There were no specific mitigating or exacerbating factors. She became concerned and had her son drive her over here. Her pain resolved on arrival and she is pain-free now. She denies shortness of breath, diaphoresis or vomiting. She did have some mild nausea. Her son gave her 162 mg of aspirin prior to arrival. She does not normally take aspirin due to a history of peptic ulcer disease.  Past Medical History  Diagnosis Date  . Claudication     LOWER ARTERIAL DUPLEX, 05/27/2006 - normal  . Chest pain, unspecified     NUCLEAR STRESS TEST, 04/23/2006 - low risk scan, normal  . Murmur     2D ECHO, 08/20/2007 - EF "normal" (not noted), mild-moderate mitral annular calcification, moderate thickening of the mitral valve, aortic valve moderately sclerotic  . Hypertension   . Hyperlipidemia   . OSA (obstructive sleep apnea)     Unable to wear CPAP  . GERD (gastroesophageal reflux disease)   . PUD (peptic ulcer disease)    Past Surgical History  Procedure Laterality Date  . Total knee arthroplasty     Family History  Problem Relation Age of Onset  . ALS Brother   . Heart disease Brother   . Hypertension Brother   . Arthritis Brother   . Diabetes Sister    History  Substance Use Topics  . Smoking status: Never Smoker   . Smokeless tobacco: Not on  file  . Alcohol Use: No   OB History   Grav Para Term Preterm Abortions TAB SAB Ect Mult Living                 Review of Systems  All other systems reviewed and are negative.   Allergies  Review of patient's allergies indicates no known allergies.  Home Medications   Prior to Admission medications   Medication Sig Start Date End Date Taking? Authorizing Provider  calcium carbonate (OS-CAL) 600 MG TABS Take 600 mg by mouth daily.   Yes Historical Provider, MD  cholecalciferol (VITAMIN D) 1000 UNITS tablet Take 1,000 Units by mouth daily.   Yes Historical Provider, MD  Cyanocobalamin (B-12) 2500 MCG TABS Take 1 tablet by mouth daily.   Yes Historical Provider, MD  diazepam (VALIUM) 10 MG tablet Take 10 mg by mouth as needed for anxiety.   Yes Historical Provider, MD  enalapril (VASOTEC) 2.5 MG tablet Take 2.5 mg by mouth daily.   Yes Historical Provider, MD  esomeprazole (NEXIUM) 40 MG capsule Take 40 mg by mouth daily before breakfast.   Yes Historical Provider, MD  gabapentin (NEURONTIN) 100 MG capsule Take 100 mg by mouth at bedtime.   Yes Historical Provider, MD  hydrochlorothiazide (HYDRODIURIL) 25 MG tablet Take 25 mg by mouth daily.   Yes Historical Provider, MD  HYDROcodone-acetaminophen (NORCO) 10-325 MG per tablet  1 tablet every 4 (four) hours as needed.  04/17/13  Yes Historical Provider, MD  levothyroxine (SYNTHROID, LEVOTHROID) 100 MCG tablet Take 100 mcg by mouth daily before breakfast.   Yes Historical Provider, MD  nebivolol (BYSTOLIC) 5 MG tablet Take 1 tablet (5 mg total) by mouth daily. <PLEASE MAKE APPOINTMENT FOR REFILLS> 06/26/14  Yes Runell Gess, MD  Omega-3 Fatty Acids (FISH OIL) 1200 MG CAPS Take 1 capsule by mouth daily.   Yes Historical Provider, MD  potassium chloride SA (K-DUR,KLOR-CON) 20 MEQ tablet Take 1 tablet (20 mEq total) by mouth 2 (two) times daily. 09/06/13  Yes Runell Gess, MD  vitamin C (ASCORBIC ACID) 500 MG tablet Take 500 mg by mouth  daily.   Yes Historical Provider, MD   BP 137/66  Pulse 78  Temp(Src) 98.7 F (37.1 C) (Oral)  Resp 17  Ht 5' (1.524 m)  Wt 220 lb (99.791 kg)  BMI 42.97 kg/m2  SpO2 94%  Physical Exam General: Well-developed, well-nourished female in no acute distress; appearance consistent with age of record HENT: normocephalic; atraumatic Eyes: pupils equal, round and reactive to light; extraocular muscles intact Neck: supple Heart: regular rate and rhythm; faint systolic murmur at left upper sternal border; occasional ectopy Lungs: clear to auscultation bilaterally Abdomen: soft; nondistended; nontender; no masses or hepatosplenomegaly; bowel sounds present Extremities: No deformity; full range of motion; pulses normal Neurologic: Awake, alert and oriented; motor function intact in all extremities and symmetric; no facial droop Skin: Warm and dry Psychiatric: Normal mood and affect    ED Course  Procedures (including critical care time)   EKG Interpretation   Date/Time:  Saturday July 08 2014 03:03:48 EDT Ventricular Rate:  95 PR Interval:  56 QRS Duration: 90 QT Interval:  375 QTC Calculation: 471 R Axis:   160 Text Interpretation:  Artifact prohibits adequate interpretation No old  tracing to compare Confirmed by Avalynne Diver  MD, Jonny Ruiz ([id number]) on 07/08/2014  3:09:37 AM      MDM  Nursing notes and vitals signs, including pulse oximetry, reviewed.  Summary of this visit's results, reviewed by myself:  Labs:  Results for orders placed during the hospital encounter of 07/08/14 (from the past 24 hour(s))  CBC WITH DIFFERENTIAL     Status: Abnormal   Collection Time    07/08/14  3:25 AM      Result Value Ref Range   WBC 9.6  4.0 - 10.5 K/uL   RBC 4.28  3.87 - 5.11 MIL/uL   Hemoglobin 12.7  12.0 - 15.0 g/dL   HCT 63.9  43.2 - 00.3 %   MCV 91.1  78.0 - 100.0 fL   MCH 29.7  26.0 - 34.0 pg   MCHC 32.6  30.0 - 36.0 g/dL   RDW 79.4  44.6 - 19.0 %   Platelets 196  150 - 400 K/uL    Neutrophils Relative % 81 (*) 43 - 77 %   Neutro Abs 7.9 (*) 1.7 - 7.7 K/uL   Lymphocytes Relative 13  12 - 46 %   Lymphs Abs 1.3  0.7 - 4.0 K/uL   Monocytes Relative 5  3 - 12 %   Monocytes Absolute 0.5  0.1 - 1.0 K/uL   Eosinophils Relative 1  0 - 5 %   Eosinophils Absolute 0.1  0.0 - 0.7 K/uL   Basophils Relative 0  0 - 1 %   Basophils Absolute 0.0  0.0 - 0.1 K/uL  BASIC METABOLIC PANEL  Status: Abnormal   Collection Time    07/08/14  3:25 AM      Result Value Ref Range   Sodium 138  137 - 147 mEq/L   Potassium 3.7  3.7 - 5.3 mEq/L   Chloride 98  96 - 112 mEq/L   CO2 28  19 - 32 mEq/L   Glucose, Bld 163 (*) 70 - 99 mg/dL   BUN 13  6 - 23 mg/dL   Creatinine, Ser 1.610.73  0.50 - 1.10 mg/dL   Calcium 9.9  8.4 - 09.610.5 mg/dL   GFR calc non Af Amer 82 (*) >90 mL/min   GFR calc Af Amer >90  >90 mL/min   Anion gap 12  5 - 15  TROPONIN I     Status: None   Collection Time    07/08/14  3:25 AM      Result Value Ref Range   Troponin I <0.30  <0.30 ng/mL    Imaging Studies: Dg Chest 2 View  07/08/2014   CLINICAL DATA:  Sharp chest pain.  EXAM: CHEST  2 VIEW  COMPARISON:  04/16/2014 and is 03/02/2006  FINDINGS: Heart size and pulmonary vascularity are normal. Prominent soft tissue at the right lung base medially most likely represents a prominent pericardial fat pad or pericardial cyst and appears unchanged since 2007.  Lungs are clear.  No effusions.  There is a chronic moderate thoracolumbar scoliosis, unchanged.  IMPRESSION: No acute abnormality.   Electronically Signed   By: Geanie CooleyJim  Maxwell M.D.   On: 07/08/2014 03:57    3:10 AM EKG contains too much artifact to properly interpret but there is no evidence of acute ST elevation myocardial infarction nor lethal arrhythmia.  4:39 AM Patient continues to be asymptomatic. We'll have her admitted for chest pain. The hospitalist has requested lipase and hepatic function panel added to her labs.    Hanley SeamenJohn L Bennye Nix, MD 07/08/14 [phone]

## 2014-07-08 NOTE — Progress Notes (Signed)
UR completed 

## 2014-07-08 NOTE — H&P (Signed)
PCP:   Cassell SmilesFUSCO,LAWRENCE J., MD   Chief Complaint:  cp  HPI: 73 yo female h/o PUD, murmur, htn, hld, gerd comes in with chest pain from her throat down to her lower sternum that started about 9pm tonight.  She has never experienced this pain before.  She went to lie down and fell asleep and then woke up about 1am with the pain still there.  Her son gave her 2 baby aspirin and she came to the ED, by the time she got here the pain was gone.  No n/v.  No fevers.  No coughing.  No recent illnesses.  No le edema or swelling.  No sob.  She feels back to normal now.  No h/o CAD.  No associated diaphoresis.  Does not feel like her typical heartburn.  Review of Systems:  Positive and negative as per HPI otherwise all other systems are negative  Past Medical History: Past Medical History  Diagnosis Date  . Claudication     LOWER ARTERIAL DUPLEX, 05/27/2006 - normal  . Chest pain, unspecified     NUCLEAR STRESS TEST, 04/23/2006 - low risk scan, normal  . Murmur     2D ECHO, 08/20/2007 - EF "normal" (not noted), mild-moderate mitral annular calcification, moderate thickening of the mitral valve, aortic valve moderately sclerotic  . Hypertension   . Hyperlipidemia   . OSA (obstructive sleep apnea)     Unable to wear CPAP  . GERD (gastroesophageal reflux disease)   . PUD (peptic ulcer disease)    Past Surgical History  Procedure Laterality Date  . Total knee arthroplasty      Medications: Prior to Admission medications   Medication Sig Start Date End Date Taking? Authorizing Provider  calcium carbonate (OS-CAL) 600 MG TABS Take 600 mg by mouth daily.   Yes Historical Provider, MD  cholecalciferol (VITAMIN D) 1000 UNITS tablet Take 1,000 Units by mouth daily.   Yes Historical Provider, MD  Cyanocobalamin (B-12) 2500 MCG TABS Take 1 tablet by mouth daily.   Yes Historical Provider, MD  diazepam (VALIUM) 10 MG tablet Take 10 mg by mouth as needed for anxiety.   Yes Historical Provider, MD   enalapril (VASOTEC) 2.5 MG tablet Take 2.5 mg by mouth daily.   Yes Historical Provider, MD  esomeprazole (NEXIUM) 40 MG capsule Take 40 mg by mouth daily before breakfast.   Yes Historical Provider, MD  gabapentin (NEURONTIN) 100 MG capsule Take 100 mg by mouth at bedtime.   Yes Historical Provider, MD  hydrochlorothiazide (HYDRODIURIL) 25 MG tablet Take 25 mg by mouth daily.   Yes Historical Provider, MD  HYDROcodone-acetaminophen (NORCO) 10-325 MG per tablet 1 tablet every 4 (four) hours as needed.  04/17/13  Yes Historical Provider, MD  levothyroxine (SYNTHROID, LEVOTHROID) 100 MCG tablet Take 100 mcg by mouth daily before breakfast.   Yes Historical Provider, MD  nebivolol (BYSTOLIC) 5 MG tablet Take 1 tablet (5 mg total) by mouth daily. <PLEASE MAKE APPOINTMENT FOR REFILLS> 06/26/14  Yes Runell GessJonathan J Berry, MD  Omega-3 Fatty Acids (FISH OIL) 1200 MG CAPS Take 1 capsule by mouth daily.   Yes Historical Provider, MD  potassium chloride SA (K-DUR,KLOR-CON) 20 MEQ tablet Take 1 tablet (20 mEq total) by mouth 2 (two) times daily. 09/06/13  Yes Runell GessJonathan J Berry, MD  vitamin C (ASCORBIC ACID) 500 MG tablet Take 500 mg by mouth daily.   Yes Historical Provider, MD    Allergies:  No Known Allergies  Social History:  reports  that she has never smoked. She does not have any smokeless tobacco history on file. She reports that she does not drink alcohol or use illicit drugs.  Family History: Family History  Problem Relation Age of Onset  . ALS Brother   . Heart disease Brother   . Hypertension Brother   . Arthritis Brother   . Diabetes Sister     Physical Exam: Filed Vitals:   07/08/14 0308 07/08/14 0400  BP: 137/66 121/64  Pulse: 78 68  Temp: 98.7 F (37.1 C)   TempSrc: Oral   Resp: 17 20  Height: 5' (1.524 m)   Weight: 99.791 kg (220 lb)   SpO2: 94% 95%   General appearance: alert, cooperative and no distress Head: Normocephalic, without obvious abnormality, atraumatic Eyes:  negative Nose: Nares normal. Septum midline. Mucosa normal. No drainage or sinus tenderness. Neck: no JVD and supple, symmetrical, trachea midline Lungs: clear to auscultation bilaterally Heart: regular rate and rhythm, S1, S2 normal, no murmur, click, rub or gallop Abdomen: soft, non-tender; bowel sounds normal; no masses,  no organomegaly Extremities: extremities normal, atraumatic, no cyanosis or edema Pulses: 2+ and symmetric Skin: Skin color, texture, turgor normal. No rashes or lesions Neurologic: Grossly normal    Labs on Admission:   Recent Labs  07/08/14 0325  NA 138  K 3.7  CL 98  CO2 28  GLUCOSE 163*  BUN 13  CREATININE 0.73  CALCIUM 9.9    Recent Labs  07/08/14 0325  WBC 9.6  NEUTROABS 7.9*  HGB 12.7  HCT 39.0  MCV 91.1  PLT 196    Recent Labs  07/08/14 0325  TROPONINI <0.30   Radiological Exams on Admission: Dg Chest 2 View  07/08/2014   CLINICAL DATA:  Sharp chest pain.  EXAM: CHEST  2 VIEW  COMPARISON:  04/16/2014 and is 03/02/2006  FINDINGS: Heart size and pulmonary vascularity are normal. Prominent soft tissue at the right lung base medially most likely represents a prominent pericardial fat pad or pericardial cyst and appears unchanged since 2007.  Lungs are clear.  No effusions.  There is a chronic moderate thoracolumbar scoliosis, unchanged.  IMPRESSION: No acute abnormality.   Electronically Signed   By: Geanie Cooley M.D.   On: 07/08/2014 03:57    Assessment/Plan  73 yo female with atypical chest pain  Principal Problem:   Chest pain-  obs on tele, romi.  Echo in am.  ekg no acute changes with neg troponin.  Will also add on lfts/lipase.    Active Problems:  Stable unless o/w noted   Essential hypertension   Hyperlipidemia   Obstructive sleep apnea   PUD (peptic ulcer disease)  obs tele.  Full code.  Kateena Degroote A 07/08/2014, 4:38 AM

## 2014-07-08 NOTE — Progress Notes (Signed)
Patient and son received discharge instructions/scripts and had no further questions or concerns.  Patient's IV was removed and was clean, dry, and intact at removal.  Patient received flu vaccination prior to discharge.  Patient was escorted to vehicle via wheelchair by nurse tech.  Patient was in stable condition at discharge.

## 2014-07-08 NOTE — Discharge Summary (Signed)
Physician Discharge Summary  Christina MeekerBetty T Bamber ZOX:[id number] DOB: 08/05/1941 DOA: 07/08/2014  PCP: Cassell SmilesFUSCO,LAWRENCE J., MD  Admit date: 07/08/2014 Discharge date: 07/08/2014  Time spent: 38 minutes  Recommendations for Outpatient Follow-up:  1. Follow with cardiology as OP  2. Labs in 1 week 3. Might need Stress test   Discharge Diagnoses:  Principal Problem:   Chest pain Active Problems:   Essential hypertension   Hyperlipidemia   Obstructive sleep apnea   PUD (peptic ulcer disease)   Discharge Condition: imporved  Diet recommendation: heart healthy  Filed Weights   07/08/14 0308 07/08/14 0500  Weight: 99.791 kg (220 lb) 97.569 kg (215 lb 1.6 oz)    History of present illness:   73 y/o ?, known OSA not req CPAP per OV note1/9/14, OA, Morbid obesity, Body mass index is 42.01 kg/(m^2)., htn, sclerotic AV valve admitted overnight for c/o CP ROMI with troponin's x 3 EKG was poor quality She stated the CP was central chest and sharp, without burning and hasn't been similar to her Genella RifeGerd.  She had no rad of pain or nausea or worsening with activtiy and this was at rest Echo was not suggestive of WM anomaly grossly It was suggested to her a close Cardiology follow up with Dr. Allyson SabalBerry Because some of her symptoms may have been ischemic, I placed her on Imdur 15 and cut down her HCTZ She knows to f/u cardiology within 1 week for stress test consideration    Discharge Exam: Filed Vitals:   07/08/14 1421  BP: 114/56  Pulse: 71  Temp: 98.2 F (36.8 C)  Resp: 16    General: alert better no apparent distress tol po failry Cardiovascular: s1 s2 no m/r/g Respiratory: clear  Discharge Instructions You were cared for by a hospitalist during your hospital stay. If you have any questions about your discharge medications or the care you received while you were in the hospital after you are discharged, you can call the unit and asked to speak with the hospitalist on call if the  hospitalist that took care of you is not available. Once you are discharged, your primary care physician will handle any further medical issues. Please note that NO REFILLS for any discharge medications will be authorized once you are discharged, as it is imperative that you return to your primary care physician (or establish a relationship with a primary care physician if you do not have one) for your aftercare needs so that they can reassess your need for medications and monitor your lab values.  Discharge Instructions   Diet - low sodium heart healthy    Complete by:  As directed      Discharge instructions    Complete by:  As directed   Follow up c cardiology I have changed hctz to a lower and am adding Imdur 15 mg once a day Speak to cardiology about continuing this follow up with cardiology in about 1 week     Increase activity slowly    Complete by:  As directed           Current Discharge Medication List    START taking these medications   Details  isosorbide mononitrate (IMDUR) 15 mg TB24 24 hr tablet Take 0.5 tablets (15 mg total) by mouth daily. Qty: 30 tablet, Refills: 0      CONTINUE these medications which have NOT CHANGED   Details  calcium carbonate (OS-CAL) 600 MG TABS Take 600 mg by mouth daily.    cholecalciferol (VITAMIN  D) 1000 UNITS tablet Take 1,000 Units by mouth daily.    Cyanocobalamin (B-12) 2500 MCG TABS Take 1 tablet by mouth daily.    diazepam (VALIUM) 10 MG tablet Take 10 mg by mouth as needed for anxiety.    enalapril (VASOTEC) 2.5 MG tablet Take 2.5 mg by mouth daily.    esomeprazole (NEXIUM) 40 MG capsule Take 40 mg by mouth daily before breakfast.    gabapentin (NEURONTIN) 100 MG capsule Take 100 mg by mouth at bedtime.    hydrochlorothiazide (HYDRODIURIL) 25 MG tablet Take 25 mg by mouth daily.    HYDROcodone-acetaminophen (NORCO) 10-325 MG per tablet 1 tablet every 4 (four) hours as needed.     levothyroxine (SYNTHROID, LEVOTHROID) 100  MCG tablet Take 100 mcg by mouth daily before breakfast.    nebivolol (BYSTOLIC) 5 MG tablet Take 1 tablet (5 mg total) by mouth daily. <PLEASE MAKE APPOINTMENT FOR REFILLS> Qty: 15 tablet, Refills: 0    Omega-3 Fatty Acids (FISH OIL) 1200 MG CAPS Take 1 capsule by mouth daily.    potassium chloride SA (K-DUR,KLOR-CON) 20 MEQ tablet Take 1 tablet (20 mEq total) by mouth 2 (two) times daily. Qty: 60 tablet, Refills: 8      STOP taking these medications     vitamin C (ASCORBIC ACID) 500 MG tablet        No Known Allergies    The results of significant diagnostics from this hospitalization (including imaging, microbiology, ancillary and laboratory) are listed below for reference.    Significant Diagnostic Studies: Dg Chest 2 View  07/08/2014   CLINICAL DATA:  Sharp chest pain.  EXAM: CHEST  2 VIEW  COMPARISON:  04/16/2014 and is 03/02/2006  FINDINGS: Heart size and pulmonary vascularity are normal. Prominent soft tissue at the right lung base medially most likely represents a prominent pericardial fat pad or pericardial cyst and appears unchanged since 2007.  Lungs are clear.  No effusions.  There is a chronic moderate thoracolumbar scoliosis, unchanged.  IMPRESSION: No acute abnormality.   Electronically Signed   By: Geanie Cooley M.D.   On: 07/08/2014 03:57    Microbiology: No results found for this or any previous visit (from the past 240 hour(s)).   Labs: Basic Metabolic Panel:  Recent Labs Lab 07/08/14 0325  NA 138  K 3.7  CL 98  CO2 28  GLUCOSE 163*  BUN 13  CREATININE 0.73  CALCIUM 9.9   Liver Function Tests:  Recent Labs Lab 07/08/14 0438  AST 49*  ALT 24  ALKPHOS 127*  BILITOT 0.9  PROT 7.8  ALBUMIN 3.6    Recent Labs Lab 07/08/14 0438  LIPASE 28   No results found for this basename: AMMONIA,  in the last 168 hours CBC:  Recent Labs Lab 07/08/14 0325  WBC 9.6  NEUTROABS 7.9*  HGB 12.7  HCT 39.0  MCV 91.1  PLT 196   Cardiac  Enzymes:  Recent Labs Lab 07/08/14 0325 07/08/14 0604 07/08/14 1158  TROPONINI <0.30 <0.30 <0.30   BNP: BNP (last 3 results) No results found for this basename: PROBNP,  in the last 8760 hours CBG: No results found for this basename: GLUCAP,  in the last 168 hours     Signed:  Rhetta Mura  [facility] 07/08/2014, 2:52 PM

## 2014-07-10 ENCOUNTER — Telehealth: Payer: Self-pay | Admitting: Cardiovascular Disease

## 2014-07-10 NOTE — Telephone Encounter (Signed)
Please call,She thought she was having a heart attack over the week-end. She went tp to [facility] this weekend. She wants to know if you received any information from that visit. The doctor wants her to see Dr. Allyson Sabal.

## 2014-07-10 NOTE — Telephone Encounter (Signed)
I spoke with patient and made her an appt to be seen 07/11/14.  Patient voiced understanding and appreciation.

## 2014-07-11 ENCOUNTER — Encounter: Payer: Self-pay | Admitting: Cardiology

## 2014-07-11 ENCOUNTER — Ambulatory Visit (INDEPENDENT_AMBULATORY_CARE_PROVIDER_SITE_OTHER): Payer: Medicare Other | Admitting: Cardiology

## 2014-07-11 VITALS — BP 124/72 | HR 61 | Ht 60.0 in | Wt 212.8 lb

## 2014-07-11 DIAGNOSIS — I10 Essential (primary) hypertension: Secondary | ICD-10-CM

## 2014-07-11 DIAGNOSIS — R079 Chest pain, unspecified: Secondary | ICD-10-CM

## 2014-07-11 DIAGNOSIS — G4733 Obstructive sleep apnea (adult) (pediatric): Secondary | ICD-10-CM

## 2014-07-11 DIAGNOSIS — R9431 Abnormal electrocardiogram [ECG] [EKG]: Secondary | ICD-10-CM

## 2014-07-11 MED ORDER — NITROGLYCERIN 0.4 MG SL SUBL: 0.4000 mg | SUBLINGUAL_TABLET | SUBLINGUAL | Status: DC | PRN

## 2014-07-11 NOTE — Assessment & Plan Note (Addendum)
See at [facility] 07/07/14- kept overnight to r/o MI. Echo was normal

## 2014-07-11 NOTE — Progress Notes (Signed)
07/11/2014 Christina Frey   01/11/1941  [id number]  Primary Physicia Cassell SmilesFUSCO,LAWRENCE J., MD Primary Cardiologist: Dr Allyson Sabalberry  HPI:  73 y/o obese female from [place], initially followed by Dr Domingo SepBradsher, then Dr Allyson SabalBerry. She had a low risk nuclear stress test in 2007. She has HTN, OSA- C-pap intol, and dyslipidemia.             She presented to [facility] ED 07/07/14 with SSCP. She was admitted to r/o MI. Troponin was negative and an echo the next morning was normal. She was referred to for further evaluation. She came to the office with her son. She has not had recurrent chest pain but her symptoms sounded worrisome on 10/30 with SSCP "pressure" which radiated to her arms. She does have some EKG changes compared to prior EKGs. She has new TWI V2-V4.   Current Outpatient Prescriptions  Medication Sig Dispense Refill  . calcium carbonate (OS-CAL) 600 MG TABS Take 600 mg by mouth daily.    . cholecalciferol (VITAMIN D) 1000 UNITS tablet Take 1,000 Units by mouth daily.    . Cyanocobalamin (B-12) 2500 MCG TABS Take 1 tablet by mouth daily.    . diazepam (VALIUM) 10 MG tablet Take 10 mg by mouth as needed for anxiety.    . enalapril (VASOTEC) 2.5 MG tablet Take 2.5 mg by mouth daily.    Marland Kitchen. esomeprazole (NEXIUM) 40 MG capsule Take 40 mg by mouth daily before breakfast.    . gabapentin (NEURONTIN) 100 MG capsule Take 100 mg by mouth at bedtime.    . hydrochlorothiazide (HYDRODIURIL) 25 MG tablet Take 25 mg by mouth daily.    Marland Kitchen. HYDROcodone-acetaminophen (NORCO) 10-325 MG per tablet 1 tablet every 4 (four) hours as needed.     . isosorbide mononitrate (IMDUR) 15 mg TB24 24 hr tablet Take 0.5 tablets (15 mg total) by mouth daily. 30 tablet 0  . levothyroxine (SYNTHROID, LEVOTHROID) 100 MCG tablet Take 100 mcg by mouth daily before breakfast.    . nebivolol (BYSTOLIC) 5 MG tablet Take 1 tablet (5 mg total) by mouth daily. <PLEASE MAKE APPOINTMENT FOR REFILLS> 15 tablet 0  . Omega-3 Fatty Acids (FISH OIL) 1200  MG CAPS Take 1 capsule by mouth daily.    . potassium chloride SA (K-DUR,KLOR-CON) 20 MEQ tablet Take 1 tablet (20 mEq total) by mouth 2 (two) times daily. 60 tablet 8  . pravastatin (PRAVACHOL) 20 MG tablet Take 1 tablet by mouth daily.    . nitroGLYCERIN (NITROSTAT) 0.4 MG SL tablet Place 1 tablet (0.4 mg total) under the tongue every 5 (five) minutes as needed for chest pain. 25 tablet 3   No current facility-administered medications for this visit.    No Known Allergies  History   Social History  . Marital Status: Widowed    Spouse Name: N/A    Number of Children: N/A  . Years of Education: N/A   Occupational History  . Not on file.   Social History Main Topics  . Smoking status: Never Smoker   . Smokeless tobacco: Not on file  . Alcohol Use: No  . Drug Use: No  . Sexual Activity: Not on file   Other Topics Concern  . Not on file   Social History Narrative     Review of Systems: General: negative for chills, fever, night sweats or weight changes.  Cardiovascular: negative for chest pain, dyspnea on exertion, edema, orthopnea, palpitations, paroxysmal nocturnal dyspnea or shortness of breath Dermatological: negative for rash  Respiratory: negative for cough or wheezing Urologic: negative for hematuria Abdominal: negative for nausea, vomiting, diarrhea, bright red blood per rectum, melena, or hematemesis Neurologic: negative for visual changes, syncope, or dizziness All other systems reviewed and are otherwise negative except as noted above.    Blood pressure 124/72, pulse 61, height 5' (1.524 m), weight 212 lb 12.8 oz (96.525 kg).  General appearance: alert, cooperative, no distress, morbidly obese and flat affect Neck: no carotid bruit and no JVD Lungs: clear to auscultation bilaterally Heart: regular rate and rhythm Extremities: No edema Skin: cool, pale, dry Neurologic: Grossly normal  EKG NSR with new TWI V2-V4  ASSESSMENT AND PLAN:   Chest pain at  rest See at [facility] ER 07/07/14- kept overnight to r/o MI. Echo was normal  Essential hypertension Controlled  Obstructive sleep apnea C-pap intolerant  Morbid obesity BMI 42  Hyperlipidemia On low dose statin  Abnormal EKG New TWI   PLAN  I ordered a two day Lexiscan Myoview. She will follow up with Dr Allyson Sabal. She was given an RX for SL NTG.  Shaylene Paganelli KPA-C 07/11/2014 5:11 PM

## 2014-07-11 NOTE — Assessment & Plan Note (Signed)
New TWI

## 2014-07-11 NOTE — Assessment & Plan Note (Signed)
On low dose statin 

## 2014-07-11 NOTE — Assessment & Plan Note (Signed)
C-pap intolerant 

## 2014-07-11 NOTE — Assessment & Plan Note (Signed)
BMI 42 

## 2014-07-11 NOTE — Patient Instructions (Signed)
Your physician recommends that you schedule a follow-up appointment in: Dr Allyson Sabal after stress test  Your physician has requested that you have a lexiscan myoview. For further information please visit [email or web address] Please follow instruction sheet, as given. (2 day protocol)

## 2014-07-11 NOTE — Assessment & Plan Note (Signed)
Controlled.  

## 2014-07-18 ENCOUNTER — Other Ambulatory Visit: Payer: Self-pay | Admitting: Cardiovascular Disease

## 2014-07-18 NOTE — Telephone Encounter (Signed)
Rx was sent to pharmacy electronically. 

## 2014-07-21 NOTE — Addendum Note (Signed)
Addended by: Abram Sander on: 07/21/2014 08:26 AM   Modules accepted: Orders

## 2014-07-27 ENCOUNTER — Telehealth (HOSPITAL_COMMUNITY): Payer: Self-pay

## 2014-07-27 NOTE — Telephone Encounter (Signed)
Encounter complete. 

## 2014-07-31 ENCOUNTER — Telehealth: Payer: Self-pay | Admitting: Cardiovascular Disease

## 2014-07-31 DIAGNOSIS — Z6841 Body Mass Index (BMI) 40.0 and over, adult: Secondary | ICD-10-CM | POA: Diagnosis not present

## 2014-07-31 DIAGNOSIS — G894 Chronic pain syndrome: Secondary | ICD-10-CM | POA: Diagnosis not present

## 2014-07-31 NOTE — Telephone Encounter (Signed)
Spoke with pt, questions regarding medicines and stress testing answered.

## 2014-07-31 NOTE — Telephone Encounter (Signed)
Pt is calling in to see it is all right for her to take a pain pill prior to coming in for stress test tomorrow because she has pain in her knees

## 2014-08-01 ENCOUNTER — Encounter (HOSPITAL_COMMUNITY): Payer: Medicare Other

## 2014-08-01 ENCOUNTER — Telehealth (HOSPITAL_COMMUNITY): Payer: Self-pay

## 2014-08-01 NOTE — Telephone Encounter (Signed)
Encounter complete. 

## 2014-08-02 ENCOUNTER — Encounter (HOSPITAL_COMMUNITY): Payer: Medicare Other

## 2014-09-11 ENCOUNTER — Other Ambulatory Visit: Payer: Self-pay | Admitting: Cardiovascular Disease

## 2014-09-11 ENCOUNTER — Other Ambulatory Visit: Payer: Self-pay | Admitting: *Deleted

## 2014-09-11 DIAGNOSIS — G894 Chronic pain syndrome: Secondary | ICD-10-CM | POA: Diagnosis not present

## 2014-09-11 DIAGNOSIS — N342 Other urethritis: Secondary | ICD-10-CM | POA: Diagnosis not present

## 2014-09-11 DIAGNOSIS — Z6838 Body mass index (BMI) 38.0-38.9, adult: Secondary | ICD-10-CM | POA: Diagnosis not present

## 2014-09-11 MED ORDER — POTASSIUM CHLORIDE CRYS ER 20 MEQ PO TBCR: 20.0000 meq | EXTENDED_RELEASE_TABLET | Freq: Two times a day (BID) | ORAL | Status: DC

## 2014-09-11 NOTE — Telephone Encounter (Signed)
Rx(s) sent to pharmacy electronically.  

## 2014-11-14 DIAGNOSIS — M179 Osteoarthritis of knee, unspecified: Secondary | ICD-10-CM | POA: Diagnosis not present

## 2014-12-06 DIAGNOSIS — F419 Anxiety disorder, unspecified: Secondary | ICD-10-CM | POA: Diagnosis not present

## 2014-12-06 DIAGNOSIS — Z6838 Body mass index (BMI) 38.0-38.9, adult: Secondary | ICD-10-CM | POA: Diagnosis not present

## 2014-12-06 DIAGNOSIS — G894 Chronic pain syndrome: Secondary | ICD-10-CM | POA: Diagnosis not present

## 2014-12-07 DIAGNOSIS — G894 Chronic pain syndrome: Secondary | ICD-10-CM | POA: Diagnosis not present

## 2015-01-26 ENCOUNTER — Other Ambulatory Visit: Payer: Self-pay | Admitting: Cardiovascular Disease

## 2015-02-27 ENCOUNTER — Other Ambulatory Visit (HOSPITAL_COMMUNITY): Payer: Self-pay | Admitting: Internal Medicine

## 2015-02-27 DIAGNOSIS — F419 Anxiety disorder, unspecified: Secondary | ICD-10-CM | POA: Diagnosis not present

## 2015-02-27 DIAGNOSIS — E782 Mixed hyperlipidemia: Secondary | ICD-10-CM | POA: Diagnosis not present

## 2015-02-27 DIAGNOSIS — Z6841 Body Mass Index (BMI) 40.0 and over, adult: Secondary | ICD-10-CM | POA: Diagnosis not present

## 2015-02-27 DIAGNOSIS — I10 Essential (primary) hypertension: Secondary | ICD-10-CM | POA: Diagnosis not present

## 2015-02-27 DIAGNOSIS — J209 Acute bronchitis, unspecified: Secondary | ICD-10-CM | POA: Diagnosis not present

## 2015-02-27 DIAGNOSIS — J0100 Acute maxillary sinusitis, unspecified: Secondary | ICD-10-CM | POA: Diagnosis not present

## 2015-02-27 DIAGNOSIS — G894 Chronic pain syndrome: Secondary | ICD-10-CM | POA: Diagnosis not present

## 2015-02-27 DIAGNOSIS — E119 Type 2 diabetes mellitus without complications: Secondary | ICD-10-CM | POA: Diagnosis not present

## 2015-02-27 DIAGNOSIS — Z1231 Encounter for screening mammogram for malignant neoplasm of breast: Secondary | ICD-10-CM

## 2015-03-08 ENCOUNTER — Ambulatory Visit (HOSPITAL_COMMUNITY): Payer: Medicare Other

## 2015-04-10 ENCOUNTER — Other Ambulatory Visit: Payer: Self-pay | Admitting: Cardiovascular Disease

## 2015-04-10 NOTE — Telephone Encounter (Signed)
REFILL 

## 2015-04-30 DIAGNOSIS — E782 Mixed hyperlipidemia: Secondary | ICD-10-CM | POA: Diagnosis not present

## 2015-04-30 DIAGNOSIS — E1129 Type 2 diabetes mellitus with other diabetic kidney complication: Secondary | ICD-10-CM | POA: Diagnosis not present

## 2015-04-30 DIAGNOSIS — E119 Type 2 diabetes mellitus without complications: Secondary | ICD-10-CM | POA: Diagnosis not present

## 2015-05-25 ENCOUNTER — Other Ambulatory Visit: Payer: Self-pay | Admitting: Cardiovascular Disease

## 2015-05-25 NOTE — Telephone Encounter (Signed)
Rx has been sent to the pharmacy electronically. ° °

## 2015-06-14 DIAGNOSIS — F329 Major depressive disorder, single episode, unspecified: Secondary | ICD-10-CM | POA: Diagnosis not present

## 2015-06-14 DIAGNOSIS — Z1389 Encounter for screening for other disorder: Secondary | ICD-10-CM | POA: Diagnosis not present

## 2015-06-14 DIAGNOSIS — E782 Mixed hyperlipidemia: Secondary | ICD-10-CM | POA: Diagnosis not present

## 2015-06-14 DIAGNOSIS — Z23 Encounter for immunization: Secondary | ICD-10-CM | POA: Diagnosis not present

## 2015-06-14 DIAGNOSIS — Z6841 Body Mass Index (BMI) 40.0 and over, adult: Secondary | ICD-10-CM | POA: Diagnosis not present

## 2015-07-10 ENCOUNTER — Other Ambulatory Visit: Payer: Self-pay | Admitting: Cardiovascular Disease

## 2015-07-11 ENCOUNTER — Other Ambulatory Visit: Payer: Self-pay

## 2015-07-11 MED ORDER — POTASSIUM CHLORIDE ER 10 MEQ PO TBCR
10.0000 meq | EXTENDED_RELEASE_TABLET | Freq: Two times a day (BID) | ORAL | Status: DC
Start: 2015-07-11 — End: 2015-10-03

## 2015-08-14 ENCOUNTER — Other Ambulatory Visit: Payer: Self-pay | Admitting: Cardiovascular Disease

## 2015-08-14 DIAGNOSIS — Z1389 Encounter for screening for other disorder: Secondary | ICD-10-CM | POA: Diagnosis not present

## 2015-08-14 DIAGNOSIS — E782 Mixed hyperlipidemia: Secondary | ICD-10-CM | POA: Diagnosis not present

## 2015-08-14 DIAGNOSIS — I10 Essential (primary) hypertension: Secondary | ICD-10-CM | POA: Diagnosis not present

## 2015-08-14 DIAGNOSIS — E1129 Type 2 diabetes mellitus with other diabetic kidney complication: Secondary | ICD-10-CM | POA: Diagnosis not present

## 2015-08-14 DIAGNOSIS — Z6841 Body Mass Index (BMI) 40.0 and over, adult: Secondary | ICD-10-CM | POA: Diagnosis not present

## 2015-08-14 NOTE — Telephone Encounter (Signed)
Rx(s) sent to pharmacy electronically.  

## 2015-08-14 NOTE — Telephone Encounter (Signed)
REFILL 

## 2015-09-07 DIAGNOSIS — Z23 Encounter for immunization: Secondary | ICD-10-CM | POA: Diagnosis not present

## 2015-09-07 DIAGNOSIS — E119 Type 2 diabetes mellitus without complications: Secondary | ICD-10-CM | POA: Diagnosis not present

## 2015-09-07 DIAGNOSIS — Z6839 Body mass index (BMI) 39.0-39.9, adult: Secondary | ICD-10-CM | POA: Diagnosis not present

## 2015-09-07 DIAGNOSIS — N760 Acute vaginitis: Secondary | ICD-10-CM | POA: Diagnosis not present

## 2015-09-07 DIAGNOSIS — Z1389 Encounter for screening for other disorder: Secondary | ICD-10-CM | POA: Diagnosis not present

## 2015-09-07 DIAGNOSIS — M1991 Primary osteoarthritis, unspecified site: Secondary | ICD-10-CM | POA: Diagnosis not present

## 2015-09-07 DIAGNOSIS — N939 Abnormal uterine and vaginal bleeding, unspecified: Secondary | ICD-10-CM | POA: Diagnosis not present

## 2015-09-07 DIAGNOSIS — E782 Mixed hyperlipidemia: Secondary | ICD-10-CM | POA: Diagnosis not present

## 2015-09-07 DIAGNOSIS — B373 Candidiasis of vulva and vagina: Secondary | ICD-10-CM | POA: Diagnosis not present

## 2015-09-07 DIAGNOSIS — I10 Essential (primary) hypertension: Secondary | ICD-10-CM | POA: Diagnosis not present

## 2015-09-08 DIAGNOSIS — F329 Major depressive disorder, single episode, unspecified: Secondary | ICD-10-CM | POA: Diagnosis not present

## 2015-09-08 DIAGNOSIS — E1129 Type 2 diabetes mellitus with other diabetic kidney complication: Secondary | ICD-10-CM | POA: Diagnosis not present

## 2015-09-08 DIAGNOSIS — M6281 Muscle weakness (generalized): Secondary | ICD-10-CM | POA: Diagnosis not present

## 2015-09-08 DIAGNOSIS — I10 Essential (primary) hypertension: Secondary | ICD-10-CM | POA: Diagnosis not present

## 2015-09-08 DIAGNOSIS — G8929 Other chronic pain: Secondary | ICD-10-CM | POA: Diagnosis not present

## 2015-09-08 DIAGNOSIS — F419 Anxiety disorder, unspecified: Secondary | ICD-10-CM | POA: Diagnosis not present

## 2015-09-08 DIAGNOSIS — Z7982 Long term (current) use of aspirin: Secondary | ICD-10-CM | POA: Diagnosis not present

## 2015-09-08 DIAGNOSIS — M170 Bilateral primary osteoarthritis of knee: Secondary | ICD-10-CM | POA: Diagnosis not present

## 2015-09-08 DIAGNOSIS — Z6841 Body Mass Index (BMI) 40.0 and over, adult: Secondary | ICD-10-CM | POA: Diagnosis not present

## 2015-09-08 DIAGNOSIS — Z9181 History of falling: Secondary | ICD-10-CM | POA: Diagnosis not present

## 2015-09-08 DIAGNOSIS — Z596 Low income: Secondary | ICD-10-CM | POA: Diagnosis not present

## 2015-09-12 ENCOUNTER — Other Ambulatory Visit: Payer: Self-pay | Admitting: Cardiovascular Disease

## 2015-09-12 DIAGNOSIS — E1129 Type 2 diabetes mellitus with other diabetic kidney complication: Secondary | ICD-10-CM | POA: Diagnosis not present

## 2015-09-12 DIAGNOSIS — F329 Major depressive disorder, single episode, unspecified: Secondary | ICD-10-CM | POA: Diagnosis not present

## 2015-09-12 DIAGNOSIS — M6281 Muscle weakness (generalized): Secondary | ICD-10-CM | POA: Diagnosis not present

## 2015-09-12 DIAGNOSIS — I10 Essential (primary) hypertension: Secondary | ICD-10-CM | POA: Diagnosis not present

## 2015-09-12 DIAGNOSIS — M170 Bilateral primary osteoarthritis of knee: Secondary | ICD-10-CM | POA: Diagnosis not present

## 2015-09-12 DIAGNOSIS — F419 Anxiety disorder, unspecified: Secondary | ICD-10-CM | POA: Diagnosis not present

## 2015-09-13 DIAGNOSIS — M6281 Muscle weakness (generalized): Secondary | ICD-10-CM | POA: Diagnosis not present

## 2015-09-13 DIAGNOSIS — E1129 Type 2 diabetes mellitus with other diabetic kidney complication: Secondary | ICD-10-CM | POA: Diagnosis not present

## 2015-09-13 DIAGNOSIS — M170 Bilateral primary osteoarthritis of knee: Secondary | ICD-10-CM | POA: Diagnosis not present

## 2015-09-13 DIAGNOSIS — F419 Anxiety disorder, unspecified: Secondary | ICD-10-CM | POA: Diagnosis not present

## 2015-09-13 DIAGNOSIS — I10 Essential (primary) hypertension: Secondary | ICD-10-CM | POA: Diagnosis not present

## 2015-09-13 DIAGNOSIS — F329 Major depressive disorder, single episode, unspecified: Secondary | ICD-10-CM | POA: Diagnosis not present

## 2015-09-14 DIAGNOSIS — M170 Bilateral primary osteoarthritis of knee: Secondary | ICD-10-CM | POA: Diagnosis not present

## 2015-09-14 DIAGNOSIS — F419 Anxiety disorder, unspecified: Secondary | ICD-10-CM | POA: Diagnosis not present

## 2015-09-14 DIAGNOSIS — E1129 Type 2 diabetes mellitus with other diabetic kidney complication: Secondary | ICD-10-CM | POA: Diagnosis not present

## 2015-09-14 DIAGNOSIS — F329 Major depressive disorder, single episode, unspecified: Secondary | ICD-10-CM | POA: Diagnosis not present

## 2015-09-14 DIAGNOSIS — M6281 Muscle weakness (generalized): Secondary | ICD-10-CM | POA: Diagnosis not present

## 2015-09-14 DIAGNOSIS — I10 Essential (primary) hypertension: Secondary | ICD-10-CM | POA: Diagnosis not present

## 2015-09-20 DIAGNOSIS — M6281 Muscle weakness (generalized): Secondary | ICD-10-CM | POA: Diagnosis not present

## 2015-09-20 DIAGNOSIS — F419 Anxiety disorder, unspecified: Secondary | ICD-10-CM | POA: Diagnosis not present

## 2015-09-20 DIAGNOSIS — I10 Essential (primary) hypertension: Secondary | ICD-10-CM | POA: Diagnosis not present

## 2015-09-20 DIAGNOSIS — F329 Major depressive disorder, single episode, unspecified: Secondary | ICD-10-CM | POA: Diagnosis not present

## 2015-09-20 DIAGNOSIS — E1129 Type 2 diabetes mellitus with other diabetic kidney complication: Secondary | ICD-10-CM | POA: Diagnosis not present

## 2015-09-20 DIAGNOSIS — M170 Bilateral primary osteoarthritis of knee: Secondary | ICD-10-CM | POA: Diagnosis not present

## 2015-09-21 DIAGNOSIS — F419 Anxiety disorder, unspecified: Secondary | ICD-10-CM | POA: Diagnosis not present

## 2015-09-21 DIAGNOSIS — I10 Essential (primary) hypertension: Secondary | ICD-10-CM | POA: Diagnosis not present

## 2015-09-21 DIAGNOSIS — F329 Major depressive disorder, single episode, unspecified: Secondary | ICD-10-CM | POA: Diagnosis not present

## 2015-09-21 DIAGNOSIS — M6281 Muscle weakness (generalized): Secondary | ICD-10-CM | POA: Diagnosis not present

## 2015-09-21 DIAGNOSIS — M170 Bilateral primary osteoarthritis of knee: Secondary | ICD-10-CM | POA: Diagnosis not present

## 2015-09-21 DIAGNOSIS — E1129 Type 2 diabetes mellitus with other diabetic kidney complication: Secondary | ICD-10-CM | POA: Diagnosis not present

## 2015-09-24 ENCOUNTER — Other Ambulatory Visit: Payer: Self-pay | Admitting: Cardiovascular Disease

## 2015-09-24 DIAGNOSIS — F329 Major depressive disorder, single episode, unspecified: Secondary | ICD-10-CM | POA: Diagnosis not present

## 2015-09-24 DIAGNOSIS — M170 Bilateral primary osteoarthritis of knee: Secondary | ICD-10-CM | POA: Diagnosis not present

## 2015-09-24 DIAGNOSIS — E1129 Type 2 diabetes mellitus with other diabetic kidney complication: Secondary | ICD-10-CM | POA: Diagnosis not present

## 2015-09-24 DIAGNOSIS — I10 Essential (primary) hypertension: Secondary | ICD-10-CM | POA: Diagnosis not present

## 2015-09-24 DIAGNOSIS — F419 Anxiety disorder, unspecified: Secondary | ICD-10-CM | POA: Diagnosis not present

## 2015-09-24 DIAGNOSIS — M6281 Muscle weakness (generalized): Secondary | ICD-10-CM | POA: Diagnosis not present

## 2015-09-26 DIAGNOSIS — M6281 Muscle weakness (generalized): Secondary | ICD-10-CM | POA: Diagnosis not present

## 2015-09-26 DIAGNOSIS — F419 Anxiety disorder, unspecified: Secondary | ICD-10-CM | POA: Diagnosis not present

## 2015-09-26 DIAGNOSIS — I10 Essential (primary) hypertension: Secondary | ICD-10-CM | POA: Diagnosis not present

## 2015-09-26 DIAGNOSIS — M170 Bilateral primary osteoarthritis of knee: Secondary | ICD-10-CM | POA: Diagnosis not present

## 2015-09-26 DIAGNOSIS — E1129 Type 2 diabetes mellitus with other diabetic kidney complication: Secondary | ICD-10-CM | POA: Diagnosis not present

## 2015-09-26 DIAGNOSIS — F329 Major depressive disorder, single episode, unspecified: Secondary | ICD-10-CM | POA: Diagnosis not present

## 2015-09-28 DIAGNOSIS — F329 Major depressive disorder, single episode, unspecified: Secondary | ICD-10-CM | POA: Diagnosis not present

## 2015-09-28 DIAGNOSIS — E1129 Type 2 diabetes mellitus with other diabetic kidney complication: Secondary | ICD-10-CM | POA: Diagnosis not present

## 2015-09-28 DIAGNOSIS — M6281 Muscle weakness (generalized): Secondary | ICD-10-CM | POA: Diagnosis not present

## 2015-09-28 DIAGNOSIS — F419 Anxiety disorder, unspecified: Secondary | ICD-10-CM | POA: Diagnosis not present

## 2015-09-28 DIAGNOSIS — I10 Essential (primary) hypertension: Secondary | ICD-10-CM | POA: Diagnosis not present

## 2015-09-28 DIAGNOSIS — M170 Bilateral primary osteoarthritis of knee: Secondary | ICD-10-CM | POA: Diagnosis not present

## 2015-10-01 DIAGNOSIS — M6281 Muscle weakness (generalized): Secondary | ICD-10-CM | POA: Diagnosis not present

## 2015-10-01 DIAGNOSIS — E1129 Type 2 diabetes mellitus with other diabetic kidney complication: Secondary | ICD-10-CM | POA: Diagnosis not present

## 2015-10-01 DIAGNOSIS — I10 Essential (primary) hypertension: Secondary | ICD-10-CM | POA: Diagnosis not present

## 2015-10-01 DIAGNOSIS — F329 Major depressive disorder, single episode, unspecified: Secondary | ICD-10-CM | POA: Diagnosis not present

## 2015-10-01 DIAGNOSIS — M170 Bilateral primary osteoarthritis of knee: Secondary | ICD-10-CM | POA: Diagnosis not present

## 2015-10-01 DIAGNOSIS — F419 Anxiety disorder, unspecified: Secondary | ICD-10-CM | POA: Diagnosis not present

## 2015-10-02 ENCOUNTER — Encounter: Payer: Self-pay | Admitting: Obstetrics & Gynecology

## 2015-10-03 ENCOUNTER — Encounter: Payer: Self-pay | Admitting: *Deleted

## 2015-10-03 DIAGNOSIS — I10 Essential (primary) hypertension: Secondary | ICD-10-CM | POA: Diagnosis not present

## 2015-10-03 DIAGNOSIS — M170 Bilateral primary osteoarthritis of knee: Secondary | ICD-10-CM | POA: Diagnosis not present

## 2015-10-03 DIAGNOSIS — E1129 Type 2 diabetes mellitus with other diabetic kidney complication: Secondary | ICD-10-CM | POA: Diagnosis not present

## 2015-10-03 DIAGNOSIS — F329 Major depressive disorder, single episode, unspecified: Secondary | ICD-10-CM | POA: Diagnosis not present

## 2015-10-03 DIAGNOSIS — F419 Anxiety disorder, unspecified: Secondary | ICD-10-CM | POA: Diagnosis not present

## 2015-10-03 DIAGNOSIS — M6281 Muscle weakness (generalized): Secondary | ICD-10-CM | POA: Diagnosis not present

## 2015-10-04 ENCOUNTER — Encounter: Payer: Self-pay | Admitting: Advanced Practice Midwife

## 2015-10-04 ENCOUNTER — Ambulatory Visit (INDEPENDENT_AMBULATORY_CARE_PROVIDER_SITE_OTHER): Payer: Medicare Other | Admitting: Advanced Practice Midwife

## 2015-10-04 VITALS — BP 148/88 | HR 64 | Ht 60.0 in | Wt 211.0 lb

## 2015-10-04 DIAGNOSIS — L900 Lichen sclerosus et atrophicus: Secondary | ICD-10-CM | POA: Insufficient documentation

## 2015-10-04 MED ORDER — FLUOCINONIDE-E 0.05 % EX CREA: 1.0000 "application " | TOPICAL_CREAM | Freq: Two times a day (BID) | CUTANEOUS | Status: DC

## 2015-10-04 NOTE — Patient Instructions (Signed)
Lichen Sclerosus Lichen sclerosus is a skin problem. It can happen on any part of the body, but it commonly involves the anal or genital areas. It can cause itching and discomfort in these areas. Treatment can help to control symptoms. When the genital area is affected, getting treatment is important because the condition can cause scarring that may lead to other problems. CAUSES The cause of this condition is not known. It could be the result of an overactive immune system or a lack of certain hormones. Lichen sclerosus is not an infection or a fungus. It is not passed from one person to another (not contagious). RISK FACTORS This condition is more likely to develop in women, usually after menopause. SYMPTOMS Symptoms of this condition include:  Thin, wrinkled, white areas on the skin.  Thickened white areas on the skin.  Red and swollen patches (lesions) on the skin.  Tears or cracks in the skin.  Bruising.  Blood blisters.  Severe itching. You may also have pain, itching, or burning with urination. Constipation is also common in people with lichen sclerosus. DIAGNOSIS This condition may be diagnosed with a physical exam. In some cases, a tissue sample (biopsy sample) may be removed to be looked at under a microscope. TREATMENT This condition is usually treated with medicated creams or ointments (topical steroids) that are applied over the affected areas. HOME CARE INSTRUCTIONS  Take over-the-counter and prescription medicines only as told by your health care provider.  Use creams or ointments as told by your health care provider.  Do not scratch the affected areas of skin.  Women should keep the vaginal area as clean and dry as possible.  Keep all follow-up visits as told by your health care provider. This is important. SEEK MEDICAL CARE IF:  You have increasing redness, swelling, or pain in the affected area.  You have fluid, blood, or pus coming from the affected  area.  You have new lesions on your skin.  You have pain or burning with urination.  You have pain during sex.   This information is not intended to replace advice given to you by your health care provider. Make sure you discuss any questions you have with your health care provider.   Document Released: 01/15/2011 Document Revised: 05/16/2015 Document Reviewed: 11/20/2014 Elsevier Interactive Patient Education 2016 [employer] Inc[employer]  

## 2015-10-04 NOTE — Progress Notes (Signed)
[facility] Visit  Patient name: Christina Frey MRN [id number]  Date of birth: October 29, 1940  CC & HPI:  Christina Frey is a 75 y.o. Caucasian female presenting today for spotting/vaginal itch. Itch is external It started a few months ago. She noticed a "knot" on L side of vagina and itching.  She was examined:  PA didn't see a Knot, but treated with diflucan. Had "a little spotting" after taking the Diflucan.  Now doesn't feel the knot anymore. Had a F/U appt with Dr Charletta Cousin because the itching would come and go.  He did not do another exam, but gave her some more diflucan (for 5 days).  Itching is still there, "comes and goes". , but 2 days ago, has had more spotting. Scratches a lot, "can't help it."   Pertinent History Reviewed:  Medical & Surgical Hx:   Past Medical History  Diagnosis Date  . Claudication [facility])     LOWER ARTERIAL DUPLEX, 05/27/2006 - normal  . Chest pain, unspecified     NUCLEAR STRESS TEST, 04/23/2006 - low risk scan, normal  . Murmur     2D ECHO, 08/20/2007 - EF "normal" (not noted), mild-moderate mitral annular calcification, moderate thickening of the mitral valve, aortic valve moderately sclerotic  . Hypertension   . Hyperlipidemia   . OSA (obstructive sleep apnea)     Unable to wear CPAP  . GERD (gastroesophageal reflux disease)   . PUD (peptic ulcer disease)   . Diabetes mellitus without complication (HCC)   . Obesity   . Osteoarthritis   . Chronic pain    Past Surgical History  Procedure Laterality Date  . Total knee arthroplasty     Family History  Problem Relation Age of Onset  . ALS Brother   . Heart disease Brother   . Hypertension Brother   . Arthritis Brother   . Diabetes Sister     Current outpatient prescriptions:  .  aspirin 81 MG tablet, Take 81 mg by mouth daily., Disp: , Rfl:  .  BYSTOLIC 5 MG tablet, TAKE ONE (1) TABLET BY MOUTH EVERY DAY, Disp: 30 tablet, Rfl: 0 .  diazepam (VALIUM) 5 MG tablet, Take 5 mg by mouth every 4  (four) hours as needed. , Disp: , Rfl:  .  Enalapril-Hydrochlorothiazide 5-12.5 MG tablet, Take 1 tablet by mouth daily., Disp: , Rfl:  .  esomeprazole (NEXIUM) 40 MG capsule, Take 40 mg by mouth daily before breakfast., Disp: , Rfl:  .  fluconazole (DIFLUCAN) 100 MG tablet, Take 100 mg by mouth every 7 (seven) days. , Disp: , Rfl:  .  gabapentin (NEURONTIN) 100 MG capsule, Take 300 mg by mouth 3 (three) times daily. , Disp: , Rfl:  .  HYDROcodone-acetaminophen (NORCO) 10-325 MG per tablet, 1 tablet every 4 (four) hours as needed. , Disp: , Rfl:  .  levothyroxine (SYNTHROID, LEVOTHROID) 100 MCG tablet, Take 100 mcg by mouth daily before breakfast., Disp: , Rfl:  .  pravastatin (PRAVACHOL) 20 MG tablet, Take 1 tablet by mouth daily., Disp: , Rfl:  .  fluocinonide-emollient (LIDEX-E) 0.05 % cream, Apply 1 application topically 2 (two) times daily., Disp: 30 g, Rfl: 0 Social History: Reviewed -  reports that she has never smoked. She does not have any smokeless tobacco history on file.  Review of Systems:    Negative except for what is discussed in HPI.  Has limited mobility d/t knee pain/back pain.    Objective Findings:  Vitals:  BP 148/88 mmHg  Pulse 64  Ht 5' (1.524 m)  Wt 211 lb (95.709 kg)  BMI 41.21 kg/m2  Physical Examination: General appearance - well appearing, and in no distress Mental status - alert, oriented to person, place, and time Chest:  Normal respiratory effort Heart - normal rate and regular rhythm Abdomen:  Soft, nontender Pelvic: external genitalia pale with varying degrees of thinning of tissues with discreet edges on vulva and in buttock.  Several excoriated areas in gluteal folds (from scratching).  SSE:  Pale, atrophied vagina.  No blood Musculoskeletal:  Normal range of motion without pain Extremities:  No edema  No results found for this or any previous visit (from the past 24 hour(s)).        Assessment & Plan:  A:   Lichen sclerosis; "spotting" is  most likely coming from skin breakdown.  I do not think it is coming from her uterus.  P:  Lidex E .5% (clobetosol not on formulary) BID.  Return in about 2 weeks (around 10/18/2015), or with JAG (needs 30 minutes).   CRESENZO-DISHMAN,Damontae Loppnow CNM 10/04/2015 11:36 AM

## 2015-10-08 DIAGNOSIS — M170 Bilateral primary osteoarthritis of knee: Secondary | ICD-10-CM | POA: Diagnosis not present

## 2015-10-08 DIAGNOSIS — M6281 Muscle weakness (generalized): Secondary | ICD-10-CM | POA: Diagnosis not present

## 2015-10-08 DIAGNOSIS — F419 Anxiety disorder, unspecified: Secondary | ICD-10-CM | POA: Diagnosis not present

## 2015-10-08 DIAGNOSIS — E1129 Type 2 diabetes mellitus with other diabetic kidney complication: Secondary | ICD-10-CM | POA: Diagnosis not present

## 2015-10-08 DIAGNOSIS — F329 Major depressive disorder, single episode, unspecified: Secondary | ICD-10-CM | POA: Diagnosis not present

## 2015-10-08 DIAGNOSIS — I10 Essential (primary) hypertension: Secondary | ICD-10-CM | POA: Diagnosis not present

## 2015-10-09 DIAGNOSIS — E1129 Type 2 diabetes mellitus with other diabetic kidney complication: Secondary | ICD-10-CM | POA: Diagnosis not present

## 2015-10-09 DIAGNOSIS — F419 Anxiety disorder, unspecified: Secondary | ICD-10-CM | POA: Diagnosis not present

## 2015-10-09 DIAGNOSIS — F329 Major depressive disorder, single episode, unspecified: Secondary | ICD-10-CM | POA: Diagnosis not present

## 2015-10-09 DIAGNOSIS — M6281 Muscle weakness (generalized): Secondary | ICD-10-CM | POA: Diagnosis not present

## 2015-10-09 DIAGNOSIS — M170 Bilateral primary osteoarthritis of knee: Secondary | ICD-10-CM | POA: Diagnosis not present

## 2015-10-09 DIAGNOSIS — I10 Essential (primary) hypertension: Secondary | ICD-10-CM | POA: Diagnosis not present

## 2015-10-11 DIAGNOSIS — E1129 Type 2 diabetes mellitus with other diabetic kidney complication: Secondary | ICD-10-CM | POA: Diagnosis not present

## 2015-10-11 DIAGNOSIS — F419 Anxiety disorder, unspecified: Secondary | ICD-10-CM | POA: Diagnosis not present

## 2015-10-11 DIAGNOSIS — M170 Bilateral primary osteoarthritis of knee: Secondary | ICD-10-CM | POA: Diagnosis not present

## 2015-10-11 DIAGNOSIS — I10 Essential (primary) hypertension: Secondary | ICD-10-CM | POA: Diagnosis not present

## 2015-10-11 DIAGNOSIS — F329 Major depressive disorder, single episode, unspecified: Secondary | ICD-10-CM | POA: Diagnosis not present

## 2015-10-11 DIAGNOSIS — M6281 Muscle weakness (generalized): Secondary | ICD-10-CM | POA: Diagnosis not present

## 2015-10-15 DIAGNOSIS — F329 Major depressive disorder, single episode, unspecified: Secondary | ICD-10-CM | POA: Diagnosis not present

## 2015-10-15 DIAGNOSIS — M6281 Muscle weakness (generalized): Secondary | ICD-10-CM | POA: Diagnosis not present

## 2015-10-15 DIAGNOSIS — I10 Essential (primary) hypertension: Secondary | ICD-10-CM | POA: Diagnosis not present

## 2015-10-15 DIAGNOSIS — E1129 Type 2 diabetes mellitus with other diabetic kidney complication: Secondary | ICD-10-CM | POA: Diagnosis not present

## 2015-10-15 DIAGNOSIS — F419 Anxiety disorder, unspecified: Secondary | ICD-10-CM | POA: Diagnosis not present

## 2015-10-15 DIAGNOSIS — M170 Bilateral primary osteoarthritis of knee: Secondary | ICD-10-CM | POA: Diagnosis not present

## 2015-10-18 ENCOUNTER — Ambulatory Visit (INDEPENDENT_AMBULATORY_CARE_PROVIDER_SITE_OTHER): Payer: Medicare Other | Admitting: Adult Health

## 2015-10-18 ENCOUNTER — Encounter: Payer: Self-pay | Admitting: Adult Health

## 2015-10-18 VITALS — BP 120/62 | HR 60 | Ht 60.0 in | Wt 213.0 lb

## 2015-10-18 DIAGNOSIS — L900 Lichen sclerosus et atrophicus: Secondary | ICD-10-CM | POA: Diagnosis not present

## 2015-10-18 NOTE — Progress Notes (Signed)
Subjective:     Patient ID: Christina Frey, female   DOB: 1940/11/08, 75 y.o.   MRN: [id number]  HPI Christina Frey is 75 year old white female, widowed in for follow up of lichen sclerosus.She says it feels much better, no itching now.Walks with walker, has PT at home. Son lives with her. PCP is Dr Sherwood Gambler.   Review of Systems  Patient denies any headaches, hearing loss, fatigue, blurred vision, shortness of breath, chest pain, abdominal pain, problems with urination, or intercourse(not having sex, since 1997). No mood swings.Has joint pain and some constipation, feels depressed at times. Reviewed past medical,surgical, social and family history. Reviewed medications and allergies.     Objective:   Physical Exam BP 120/62 mmHg  Pulse 60  Ht 5' (1.524 m)  Wt 213 lb (96.616 kg)  BMI 41.60 kg/m2   Skin warm and dry, labia less red, but has skin texture changes, has red thin area at introitus, feels much better, will continue lidex bid and follow up in 2 weeks.  Assessment:     Lichen sclerosus    Plan:     Continue lidex cream bid Follow up in 2 weeks for recheck, if not improved may biopsy Review handout on lichen sclerosus

## 2015-10-18 NOTE — Patient Instructions (Signed)
Keep using lidex cream bid to affected area Follow up in 2 weeks for recheck Lichen Sclerosus Lichen sclerosus is a skin problem. It can happen on any part of the body, but it commonly involves the anal or genital areas. It can cause itching and discomfort in these areas. Treatment can help to control symptoms. When the genital area is affected, getting treatment is important because the condition can cause scarring that may lead to other problems. CAUSES The cause of this condition is not known. It could be the result of an overactive immune system or a lack of certain hormones. Lichen sclerosus is not an infection or a fungus. It is not passed from one person to another (not contagious). RISK FACTORS This condition is more likely to develop in women, usually after menopause. SYMPTOMS Symptoms of this condition include:  Thin, wrinkled, white areas on the skin.  Thickened white areas on the skin.  Red and swollen patches (lesions) on the skin.  Tears or cracks in the skin.  Bruising.  Blood blisters.  Severe itching. You may also have pain, itching, or burning with urination. Constipation is also common in people with lichen sclerosus. DIAGNOSIS This condition may be diagnosed with a physical exam. In some cases, a tissue sample (biopsy sample) may be removed to be looked at under a microscope. TREATMENT This condition is usually treated with medicated creams or ointments (topical steroids) that are applied over the affected areas. HOME CARE INSTRUCTIONS  Take over-the-counter and prescription medicines only as told by your health care provider.  Use creams or ointments as told by your health care provider.  Do not scratch the affected areas of skin.  Women should keep the vaginal area as clean and dry as possible.  Keep all follow-up visits as told by your health care provider. This is important. SEEK MEDICAL CARE IF:  You have increasing redness, swelling, or pain in the  affected area.  You have fluid, blood, or pus coming from the affected area.  You have new lesions on your skin.  You have pain or burning with urination.  You have pain during sex.   This information is not intended to replace advice given to you by your health care provider. Make sure you discuss any questions you have with your health care provider.   Document Released: 01/15/2011 Document Revised: 05/16/2015 Document Reviewed: 11/20/2014 Elsevier Interactive Patient Education [employer]

## 2015-10-31 ENCOUNTER — Ambulatory Visit: Payer: Self-pay | Admitting: Adult Health

## 2015-11-01 DIAGNOSIS — M170 Bilateral primary osteoarthritis of knee: Secondary | ICD-10-CM | POA: Diagnosis not present

## 2015-11-01 DIAGNOSIS — M6281 Muscle weakness (generalized): Secondary | ICD-10-CM | POA: Diagnosis not present

## 2015-11-01 DIAGNOSIS — F329 Major depressive disorder, single episode, unspecified: Secondary | ICD-10-CM | POA: Diagnosis not present

## 2015-11-01 DIAGNOSIS — E1129 Type 2 diabetes mellitus with other diabetic kidney complication: Secondary | ICD-10-CM | POA: Diagnosis not present

## 2015-11-01 DIAGNOSIS — F419 Anxiety disorder, unspecified: Secondary | ICD-10-CM | POA: Diagnosis not present

## 2015-11-01 DIAGNOSIS — I10 Essential (primary) hypertension: Secondary | ICD-10-CM | POA: Diagnosis not present

## 2015-11-30 DIAGNOSIS — Z1389 Encounter for screening for other disorder: Secondary | ICD-10-CM | POA: Diagnosis not present

## 2015-11-30 DIAGNOSIS — E1129 Type 2 diabetes mellitus with other diabetic kidney complication: Secondary | ICD-10-CM | POA: Diagnosis not present

## 2015-11-30 DIAGNOSIS — G894 Chronic pain syndrome: Secondary | ICD-10-CM | POA: Diagnosis not present

## 2015-11-30 DIAGNOSIS — F419 Anxiety disorder, unspecified: Secondary | ICD-10-CM | POA: Diagnosis not present

## 2015-11-30 DIAGNOSIS — Z6839 Body mass index (BMI) 39.0-39.9, adult: Secondary | ICD-10-CM | POA: Diagnosis not present

## 2015-12-06 ENCOUNTER — Ambulatory Visit (HOSPITAL_COMMUNITY): Payer: Self-pay

## 2015-12-17 DIAGNOSIS — Z1389 Encounter for screening for other disorder: Secondary | ICD-10-CM | POA: Diagnosis not present

## 2015-12-17 DIAGNOSIS — Z6841 Body Mass Index (BMI) 40.0 and over, adult: Secondary | ICD-10-CM | POA: Diagnosis not present

## 2015-12-17 DIAGNOSIS — Z0000 Encounter for general adult medical examination without abnormal findings: Secondary | ICD-10-CM | POA: Diagnosis not present

## 2015-12-17 DIAGNOSIS — E1129 Type 2 diabetes mellitus with other diabetic kidney complication: Secondary | ICD-10-CM | POA: Diagnosis not present

## 2015-12-18 DIAGNOSIS — Z1389 Encounter for screening for other disorder: Secondary | ICD-10-CM | POA: Diagnosis not present

## 2015-12-18 DIAGNOSIS — Z0000 Encounter for general adult medical examination without abnormal findings: Secondary | ICD-10-CM | POA: Diagnosis not present

## 2015-12-18 DIAGNOSIS — E1129 Type 2 diabetes mellitus with other diabetic kidney complication: Secondary | ICD-10-CM | POA: Diagnosis not present

## 2015-12-18 DIAGNOSIS — Z6841 Body Mass Index (BMI) 40.0 and over, adult: Secondary | ICD-10-CM | POA: Diagnosis not present

## 2015-12-19 ENCOUNTER — Other Ambulatory Visit (HOSPITAL_COMMUNITY): Payer: Self-pay | Admitting: Internal Medicine

## 2015-12-19 DIAGNOSIS — Z13820 Encounter for screening for osteoporosis: Secondary | ICD-10-CM

## 2015-12-21 ENCOUNTER — Ambulatory Visit (HOSPITAL_COMMUNITY)
Admission: RE | Admit: 2015-12-21 | Discharge: 2015-12-21 | Disposition: A | Discharge status: Home / Self Care | Payer: Medicare Other | Source: Ambulatory Visit | Attending: Internal Medicine | Admitting: Internal Medicine

## 2015-12-21 DIAGNOSIS — Z1231 Encounter for screening mammogram for malignant neoplasm of breast: Secondary | ICD-10-CM

## 2015-12-21 DIAGNOSIS — Z13820 Encounter for screening for osteoporosis: Secondary | ICD-10-CM | POA: Insufficient documentation

## 2016-01-14 DIAGNOSIS — E1129 Type 2 diabetes mellitus with other diabetic kidney complication: Secondary | ICD-10-CM | POA: Diagnosis not present

## 2016-03-03 DIAGNOSIS — M179 Osteoarthritis of knee, unspecified: Secondary | ICD-10-CM | POA: Diagnosis not present

## 2016-03-03 DIAGNOSIS — Z96651 Presence of right artificial knee joint: Secondary | ICD-10-CM | POA: Diagnosis not present

## 2016-03-04 DIAGNOSIS — G894 Chronic pain syndrome: Secondary | ICD-10-CM | POA: Diagnosis not present

## 2016-03-04 DIAGNOSIS — I10 Essential (primary) hypertension: Secondary | ICD-10-CM | POA: Diagnosis not present

## 2016-03-04 DIAGNOSIS — Z6841 Body Mass Index (BMI) 40.0 and over, adult: Secondary | ICD-10-CM | POA: Diagnosis not present

## 2016-03-04 DIAGNOSIS — M1991 Primary osteoarthritis, unspecified site: Secondary | ICD-10-CM | POA: Diagnosis not present

## 2016-03-04 DIAGNOSIS — E119 Type 2 diabetes mellitus without complications: Secondary | ICD-10-CM | POA: Diagnosis not present

## 2016-04-22 DIAGNOSIS — H2511 Age-related nuclear cataract, right eye: Secondary | ICD-10-CM | POA: Diagnosis not present

## 2016-04-22 DIAGNOSIS — H401121 Primary open-angle glaucoma, left eye, mild stage: Secondary | ICD-10-CM | POA: Diagnosis not present

## 2016-04-22 DIAGNOSIS — Z961 Presence of intraocular lens: Secondary | ICD-10-CM | POA: Diagnosis not present

## 2016-04-22 DIAGNOSIS — E119 Type 2 diabetes mellitus without complications: Secondary | ICD-10-CM | POA: Diagnosis not present

## 2016-05-30 DIAGNOSIS — E119 Type 2 diabetes mellitus without complications: Secondary | ICD-10-CM | POA: Diagnosis not present

## 2016-05-30 DIAGNOSIS — E669 Obesity, unspecified: Secondary | ICD-10-CM | POA: Diagnosis not present

## 2016-05-30 DIAGNOSIS — Z23 Encounter for immunization: Secondary | ICD-10-CM | POA: Diagnosis not present

## 2016-05-30 DIAGNOSIS — I10 Essential (primary) hypertension: Secondary | ICD-10-CM | POA: Diagnosis not present

## 2016-05-30 DIAGNOSIS — M1991 Primary osteoarthritis, unspecified site: Secondary | ICD-10-CM | POA: Diagnosis not present

## 2016-05-30 DIAGNOSIS — Z6839 Body mass index (BMI) 39.0-39.9, adult: Secondary | ICD-10-CM | POA: Diagnosis not present

## 2016-05-30 DIAGNOSIS — R201 Hypoesthesia of skin: Secondary | ICD-10-CM | POA: Diagnosis not present

## 2016-05-30 DIAGNOSIS — E782 Mixed hyperlipidemia: Secondary | ICD-10-CM | POA: Diagnosis not present

## 2016-08-08 DIAGNOSIS — R5383 Other fatigue: Secondary | ICD-10-CM | POA: Diagnosis not present

## 2016-08-08 DIAGNOSIS — Z6839 Body mass index (BMI) 39.0-39.9, adult: Secondary | ICD-10-CM | POA: Diagnosis not present

## 2016-08-08 DIAGNOSIS — Z1389 Encounter for screening for other disorder: Secondary | ICD-10-CM | POA: Diagnosis not present

## 2016-09-09 DIAGNOSIS — M2550 Pain in unspecified joint: Secondary | ICD-10-CM | POA: Diagnosis not present

## 2016-09-09 DIAGNOSIS — N182 Chronic kidney disease, stage 2 (mild): Secondary | ICD-10-CM | POA: Diagnosis not present

## 2016-09-09 DIAGNOSIS — M159 Polyosteoarthritis, unspecified: Secondary | ICD-10-CM | POA: Diagnosis not present

## 2016-09-09 DIAGNOSIS — M1991 Primary osteoarthritis, unspecified site: Secondary | ICD-10-CM | POA: Diagnosis not present

## 2016-09-09 DIAGNOSIS — E1129 Type 2 diabetes mellitus with other diabetic kidney complication: Secondary | ICD-10-CM | POA: Diagnosis not present

## 2016-09-09 DIAGNOSIS — I10 Essential (primary) hypertension: Secondary | ICD-10-CM | POA: Diagnosis not present

## 2016-09-09 DIAGNOSIS — Z1389 Encounter for screening for other disorder: Secondary | ICD-10-CM | POA: Diagnosis not present

## 2016-09-09 DIAGNOSIS — Z6839 Body mass index (BMI) 39.0-39.9, adult: Secondary | ICD-10-CM | POA: Diagnosis not present

## 2016-09-23 DIAGNOSIS — H401131 Primary open-angle glaucoma, bilateral, mild stage: Secondary | ICD-10-CM | POA: Diagnosis not present

## 2016-10-26 ENCOUNTER — Emergency Department (HOSPITAL_COMMUNITY): Payer: Medicare Other

## 2016-10-26 ENCOUNTER — Inpatient Hospital Stay (HOSPITAL_COMMUNITY)
Admission: EM | Admit: 2016-10-26 | Discharge: 2016-10-28 | DRG: 690 | Disposition: A | Discharge status: Home / Self Care | Payer: Medicare Other | Attending: Internal Medicine | Admitting: Internal Medicine

## 2016-10-26 ENCOUNTER — Encounter (HOSPITAL_COMMUNITY): Payer: Self-pay

## 2016-10-26 DIAGNOSIS — W19XXXA Unspecified fall, initial encounter: Secondary | ICD-10-CM | POA: Diagnosis not present

## 2016-10-26 DIAGNOSIS — S8992XA Unspecified injury of left lower leg, initial encounter: Secondary | ICD-10-CM | POA: Diagnosis not present

## 2016-10-26 DIAGNOSIS — R42 Dizziness and giddiness: Secondary | ICD-10-CM

## 2016-10-26 DIAGNOSIS — B961 Klebsiella pneumoniae [K. pneumoniae] as the cause of diseases classified elsewhere: Secondary | ICD-10-CM | POA: Diagnosis not present

## 2016-10-26 DIAGNOSIS — I951 Orthostatic hypotension: Secondary | ICD-10-CM | POA: Diagnosis not present

## 2016-10-26 DIAGNOSIS — E785 Hyperlipidemia, unspecified: Secondary | ICD-10-CM | POA: Diagnosis present

## 2016-10-26 DIAGNOSIS — Z96651 Presence of right artificial knee joint: Secondary | ICD-10-CM | POA: Diagnosis present

## 2016-10-26 DIAGNOSIS — E669 Obesity, unspecified: Secondary | ICD-10-CM | POA: Diagnosis present

## 2016-10-26 DIAGNOSIS — I10 Essential (primary) hypertension: Secondary | ICD-10-CM | POA: Diagnosis not present

## 2016-10-26 DIAGNOSIS — E1151 Type 2 diabetes mellitus with diabetic peripheral angiopathy without gangrene: Secondary | ICD-10-CM | POA: Diagnosis not present

## 2016-10-26 DIAGNOSIS — W1811XA Fall from or off toilet without subsequent striking against object, initial encounter: Secondary | ICD-10-CM | POA: Diagnosis present

## 2016-10-26 DIAGNOSIS — R4781 Slurred speech: Secondary | ICD-10-CM | POA: Diagnosis present

## 2016-10-26 DIAGNOSIS — G4733 Obstructive sleep apnea (adult) (pediatric): Secondary | ICD-10-CM | POA: Diagnosis present

## 2016-10-26 DIAGNOSIS — E860 Dehydration: Secondary | ICD-10-CM | POA: Diagnosis present

## 2016-10-26 DIAGNOSIS — Z8711 Personal history of peptic ulcer disease: Secondary | ICD-10-CM

## 2016-10-26 DIAGNOSIS — M25561 Pain in right knee: Secondary | ICD-10-CM | POA: Diagnosis not present

## 2016-10-26 DIAGNOSIS — R55 Syncope and collapse: Secondary | ICD-10-CM | POA: Diagnosis not present

## 2016-10-26 DIAGNOSIS — M25562 Pain in left knee: Secondary | ICD-10-CM | POA: Diagnosis not present

## 2016-10-26 DIAGNOSIS — R296 Repeated falls: Secondary | ICD-10-CM | POA: Diagnosis not present

## 2016-10-26 DIAGNOSIS — Z888 Allergy status to other drugs, medicaments and biological substances status: Secondary | ICD-10-CM

## 2016-10-26 DIAGNOSIS — N390 Urinary tract infection, site not specified: Principal | ICD-10-CM | POA: Diagnosis present

## 2016-10-26 DIAGNOSIS — R404 Transient alteration of awareness: Secondary | ICD-10-CM | POA: Diagnosis not present

## 2016-10-26 DIAGNOSIS — Z66 Do not resuscitate: Secondary | ICD-10-CM | POA: Diagnosis present

## 2016-10-26 DIAGNOSIS — R778 Other specified abnormalities of plasma proteins: Secondary | ICD-10-CM | POA: Diagnosis present

## 2016-10-26 DIAGNOSIS — G8929 Other chronic pain: Secondary | ICD-10-CM | POA: Diagnosis present

## 2016-10-26 DIAGNOSIS — R531 Weakness: Secondary | ICD-10-CM | POA: Diagnosis not present

## 2016-10-26 DIAGNOSIS — Z79899 Other long term (current) drug therapy: Secondary | ICD-10-CM

## 2016-10-26 DIAGNOSIS — Z6838 Body mass index (BMI) 38.0-38.9, adult: Secondary | ICD-10-CM

## 2016-10-26 DIAGNOSIS — S299XXA Unspecified injury of thorax, initial encounter: Secondary | ICD-10-CM | POA: Diagnosis not present

## 2016-10-26 DIAGNOSIS — K219 Gastro-esophageal reflux disease without esophagitis: Secondary | ICD-10-CM | POA: Diagnosis present

## 2016-10-26 DIAGNOSIS — E039 Hypothyroidism, unspecified: Secondary | ICD-10-CM | POA: Diagnosis present

## 2016-10-26 DIAGNOSIS — Z7982 Long term (current) use of aspirin: Secondary | ICD-10-CM

## 2016-10-26 DIAGNOSIS — S8991XA Unspecified injury of right lower leg, initial encounter: Secondary | ICD-10-CM | POA: Diagnosis not present

## 2016-10-26 DIAGNOSIS — S0990XA Unspecified injury of head, initial encounter: Secondary | ICD-10-CM | POA: Diagnosis not present

## 2016-10-26 DIAGNOSIS — I482 Chronic atrial fibrillation: Secondary | ICD-10-CM | POA: Diagnosis not present

## 2016-10-26 LAB — URINALYSIS, ROUTINE W REFLEX MICROSCOPIC
Bilirubin Urine: NEGATIVE
Glucose, UA: NEGATIVE mg/dL
Ketones, ur: NEGATIVE mg/dL
Leukocytes, UA: NEGATIVE
Nitrite: POSITIVE — AB
PROTEIN: NEGATIVE mg/dL
Specific Gravity, Urine: 1.023 (ref 1.005–1.030)
pH: 5 (ref 5.0–8.0)

## 2016-10-26 LAB — CBC WITH DIFFERENTIAL/PLATELET
BASOS ABS: 0 10*3/uL (ref 0.0–0.1)
Basophils Relative: 0 %
EOS PCT: 1 %
Eosinophils Absolute: 0.1 10*3/uL (ref 0.0–0.7)
HCT: 38.4 % (ref 36.0–46.0)
HEMOGLOBIN: 12.4 g/dL (ref 12.0–15.0)
LYMPHS PCT: 20 %
Lymphs Abs: 2.4 10*3/uL (ref 0.7–4.0)
MCH: 29.6 pg (ref 26.0–34.0)
MCHC: 32.3 g/dL (ref 30.0–36.0)
MCV: 91.6 fL (ref 78.0–100.0)
Monocytes Absolute: 0.7 10*3/uL (ref 0.1–1.0)
Monocytes Relative: 6 %
NEUTROS ABS: 8.8 10*3/uL — AB (ref 1.7–7.7)
NEUTROS PCT: 73 %
Platelets: 159 10*3/uL (ref 150–400)
RBC: 4.19 MIL/uL (ref 3.87–5.11)
RDW: 14.2 % (ref 11.5–15.5)
WBC: 12 10*3/uL — AB (ref 4.0–10.5)

## 2016-10-26 LAB — COMPREHENSIVE METABOLIC PANEL
ALK PHOS: 74 U/L (ref 38–126)
ALT: 13 U/L — AB (ref 14–54)
AST: 23 U/L (ref 15–41)
Albumin: 3.6 g/dL (ref 3.5–5.0)
Anion gap: 7 (ref 5–15)
BUN: 23 mg/dL — AB (ref 6–20)
CHLORIDE: 101 mmol/L (ref 101–111)
CO2: 26 mmol/L (ref 22–32)
CREATININE: 0.96 mg/dL (ref 0.44–1.00)
Calcium: 9.5 mg/dL (ref 8.9–10.3)
GFR calc Af Amer: >60 mL/min (ref >60)
GFR, EST NON AFRICAN AMERICAN: 56 mL/min — AB (ref >60)
Glucose, Bld: 116 mg/dL — ABNORMAL HIGH (ref 65–99)
Potassium: 3.3 mmol/L — ABNORMAL LOW (ref 3.5–5.1)
SODIUM: 134 mmol/L — AB (ref 135–145)
Total Bilirubin: 1.4 mg/dL — ABNORMAL HIGH (ref 0.3–1.2)
Total Protein: 6.9 g/dL (ref 6.5–8.1)

## 2016-10-26 LAB — RAPID URINE DRUG SCREEN, HOSP PERFORMED
AMPHETAMINES: NOT DETECTED
BENZODIAZEPINES: POSITIVE — AB
Barbiturates: NOT DETECTED
COCAINE: NOT DETECTED
OPIATES: POSITIVE — AB
Tetrahydrocannabinol: NOT DETECTED

## 2016-10-26 LAB — TROPONIN I
Troponin I: 0.03 ng/mL (ref <0.03)
Troponin I: <0.03 ng/mL (ref <0.03)

## 2016-10-26 LAB — TSH: TSH: 6.888 u[IU]/mL — ABNORMAL HIGH (ref 0.350–4.500)

## 2016-10-26 MED ORDER — ACETAMINOPHEN 650 MG RE SUPP: 650.0000 mg | Freq: Four times a day (QID) | RECTAL | Status: DC | PRN

## 2016-10-26 MED ORDER — SODIUM CHLORIDE 0.9 % IV SOLN: Freq: Once | INTRAVENOUS | Status: DC

## 2016-10-26 MED ORDER — ASPIRIN EC 81 MG PO TBEC
81.0000 mg | DELAYED_RELEASE_TABLET | Freq: Every day | ORAL | Status: DC
  Administered 2016-10-26 – 2016-10-28 (×3): 81 mg via ORAL
  Filled 2016-10-26 (×3): qty 1

## 2016-10-26 MED ORDER — POTASSIUM CHLORIDE CRYS ER 20 MEQ PO TBCR
40.0000 meq | EXTENDED_RELEASE_TABLET | Freq: Once | ORAL | Status: AC
  Administered 2016-10-26: 40 meq via ORAL
  Filled 2016-10-26: qty 2

## 2016-10-26 MED ORDER — NEBIVOLOL HCL 10 MG PO TABS
5.0000 mg | ORAL_TABLET | Freq: Every day | ORAL | Status: DC
  Administered 2016-10-26 – 2016-10-28 (×3): 5 mg via ORAL
  Filled 2016-10-26 (×3): qty 1

## 2016-10-26 MED ORDER — PRAVASTATIN SODIUM 10 MG PO TABS
20.0000 mg | ORAL_TABLET | Freq: Every day | ORAL | Status: DC
  Administered 2016-10-26 – 2016-10-28 (×3): 20 mg via ORAL
  Filled 2016-10-26 (×3): qty 2

## 2016-10-26 MED ORDER — ONDANSETRON HCL 4 MG/2ML IJ SOLN: 4.0000 mg | Freq: Four times a day (QID) | INTRAMUSCULAR | Status: DC | PRN

## 2016-10-26 MED ORDER — POTASSIUM CHLORIDE IN NACL 20-0.9 MEQ/L-% IV SOLN
INTRAVENOUS | Status: DC
  Administered 2016-10-26 – 2016-10-27 (×4): via INTRAVENOUS

## 2016-10-26 MED ORDER — SODIUM CHLORIDE 0.9 % IV BOLUS (SEPSIS)
500.0000 mL | Freq: Once | INTRAVENOUS | Status: AC
  Administered 2016-10-26: 500 mL via INTRAVENOUS

## 2016-10-26 MED ORDER — GABAPENTIN 100 MG PO CAPS
100.0000 mg | ORAL_CAPSULE | Freq: Every day | ORAL | Status: DC
  Administered 2016-10-26 – 2016-10-27 (×2): 100 mg via ORAL
  Filled 2016-10-26 (×2): qty 1

## 2016-10-26 MED ORDER — ONDANSETRON HCL 4 MG PO TABS: 4.0000 mg | ORAL_TABLET | Freq: Four times a day (QID) | ORAL | Status: DC | PRN

## 2016-10-26 MED ORDER — DEXTROSE 5 % IV SOLN
1.0000 g | INTRAVENOUS | Status: DC
  Administered 2016-10-27 – 2016-10-28 (×2): 1 g via INTRAVENOUS
  Filled 2016-10-26 (×3): qty 10

## 2016-10-26 MED ORDER — SODIUM CHLORIDE 0.9 % IV SOLN: INTRAVENOUS | Status: DC

## 2016-10-26 MED ORDER — LEVOTHYROXINE SODIUM 100 MCG PO TABS
100.0000 ug | ORAL_TABLET | Freq: Every day | ORAL | Status: DC
  Administered 2016-10-27 – 2016-10-28 (×2): 100 ug via ORAL
  Filled 2016-10-26 (×2): qty 1

## 2016-10-26 MED ORDER — FLUOCINONIDE 0.05 % EX CREA
1.0000 "application " | TOPICAL_CREAM | Freq: Two times a day (BID) | CUTANEOUS | Status: DC
  Administered 2016-10-26 – 2016-10-28 (×3): 1 via TOPICAL
  Filled 2016-10-26: qty 30

## 2016-10-26 MED ORDER — ENOXAPARIN SODIUM 40 MG/0.4ML ~~LOC~~ SOLN
40.0000 mg | SUBCUTANEOUS | Status: DC
  Administered 2016-10-26 – 2016-10-28 (×3): 40 mg via SUBCUTANEOUS
  Filled 2016-10-26 (×3): qty 0.4

## 2016-10-26 MED ORDER — DEXTROSE 5 % IV SOLN
1.0000 g | Freq: Once | INTRAVENOUS | Status: AC
  Administered 2016-10-26: 1 g via INTRAVENOUS
  Filled 2016-10-26: qty 10

## 2016-10-26 MED ORDER — PANTOPRAZOLE SODIUM 40 MG PO TBEC
40.0000 mg | DELAYED_RELEASE_TABLET | Freq: Every day | ORAL | Status: DC
  Administered 2016-10-26 – 2016-10-28 (×3): 40 mg via ORAL
  Filled 2016-10-26 (×3): qty 1

## 2016-10-26 MED ORDER — ACETAMINOPHEN 325 MG PO TABS
650.0000 mg | ORAL_TABLET | Freq: Four times a day (QID) | ORAL | Status: DC | PRN
Start: 2016-10-26 — End: 2016-10-28
  Administered 2016-10-26: 650 mg via ORAL
  Filled 2016-10-26: qty 2

## 2016-10-26 NOTE — ED Notes (Signed)
Pt unable to walk, but able to stand with 2 staff assist.

## 2016-10-26 NOTE — H&P (Signed)
History and Physical    Christina Frey GNF:[id number] DOB: 1940-12-10 DOA: 10/26/2016  PCP: Cassell Smiles., MD  Patient coming from: Home  Chief Complaint: Falls  HPI: Christina Frey is a 76 y.o. female with medical history significant of hypertension, hyperlipidemia, chronic pain who is on multiple medications. According to patient, she has fallen 3 times in the past week. She feels dizzy and lightheaded upon standing for the past week. By mouth intake has been decreased. She's not had any fever, cough, vomiting, diarrhea, dysuria. No shortness of breath or chest pain. She has not been started on any new medications lately. She is generally weak.  ED Course: She was noted to be mildly orthostatic in the emergency room with heart rate increasing standing. She required 2 people to help her stand and was unable to ambulate. Lab work indicated mild dehydration with elevated BUN . urinalysis indicated possible UTI. EKG did not show any acute changes, troponin was mildly elevated at 0.03. CT head, chest x-ray and x-rays of her knees are unremarkable. She's been referred for further admission.  Review of Systems: As per HPI otherwise 10 point review of systems negative.    Past Medical History:  Diagnosis Date  . Chest pain, unspecified    NUCLEAR STRESS TEST, 04/23/2006 - low risk scan, normal  . Chronic pain   . Claudication [facility])    LOWER ARTERIAL DUPLEX, 05/27/2006 - normal  . Diabetes mellitus without complication (HCC)   . GERD (gastroesophageal reflux disease)   . Hyperlipidemia   . Hypertension   . Murmur    2D ECHO, 08/20/2007 - EF "normal" (not noted), mild-moderate mitral annular calcification, moderate thickening of the mitral valve, aortic valve moderately sclerotic  . Obesity   . OSA (obstructive sleep apnea)    Unable to wear CPAP  . Osteoarthritis   . PUD (peptic ulcer disease)     Past Surgical History:  Procedure Laterality Date  . TOTAL KNEE ARTHROPLASTY       reports that she has never smoked. She has never used smokeless tobacco. She reports that she does not drink alcohol or use drugs.  Allergies  Allergen Reactions  . Other     Anti-inflammatory's-causes stomach ulcers  . Zetia [Ezetimibe] Other (See Comments)    Lightheadedness     Family History  Problem Relation Age of Onset  . ALS Brother   . Heart disease Brother   . Hypertension Brother   . Arthritis Brother   . Diabetes Sister      Prior to Admission medications   Medication Sig Start Date End Date Taking? Authorizing Provider  aspirin 81 MG tablet Take 81 mg by mouth daily.   Yes Historical Provider, MD  BYSTOLIC 5 MG tablet TAKE ONE (1) TABLET BY MOUTH EVERY DAY 08/14/15  Yes Runell Gess, MD  diazepam (VALIUM) 5 MG tablet Take 5 mg by mouth 2 (two) times daily.  09/07/15  Yes Historical Provider, MD  Enalapril-Hydrochlorothiazide 5-12.5 MG tablet Take 1 tablet by mouth daily.   Yes Historical Provider, MD  esomeprazole (NEXIUM) 40 MG capsule Take 40 mg by mouth daily before breakfast.   Yes Historical Provider, MD  fluocinonide-emollient (LIDEX-E) 0.05 % cream Apply 1 application topically 2 (two) times daily. 10/04/15  Yes Scarlette Calico Cresenzo-Dishmon, CNM  gabapentin (NEURONTIN) 100 MG capsule Take by mouth at bedtime.    Yes Historical Provider, MD  HYDROcodone-acetaminophen (NORCO) 10-325 MG per tablet 1 tablet every 4 (four) hours as needed.  04/17/13  Yes Historical Provider, MD  levothyroxine (SYNTHROID, LEVOTHROID) 100 MCG tablet Take 100 mcg by mouth daily before breakfast.   Yes Historical Provider, MD  pravastatin (PRAVACHOL) 20 MG tablet Take 1 tablet by mouth daily. 06/13/14  Yes Historical Provider, MD    Physical Exam: Vitals:   10/26/16 0434 10/26/16 0600 10/26/16 0700 10/26/16 0800  BP: 120/55 141/63 106/73 106/73  Pulse: 106 113 97 70  Resp: 11 12 10 12   Temp:    98.5 F (36.9 C)  TempSrc:      SpO2: 96% 96% 97% 97%  Weight:      Height:           Constitutional: NAD, calm, comfortable Vitals:   10/26/16 0434 10/26/16 0600 10/26/16 0700 10/26/16 0800  BP: 120/55 141/63 106/73 106/73  Pulse: 106 113 97 70  Resp: 11 12 10 12   Temp:    98.5 F (36.9 C)  TempSrc:      SpO2: 96% 96% 97% 97%  Weight:      Height:       Eyes: PERRL, lids and conjunctivae normal ENMT: Mucous membranes are Dry. Posterior pharynx clear of any exudate or lesions.Normal dentition.  Neck: normal, supple, no masses, no thyromegaly Respiratory: clear to auscultation bilaterally, no wheezing, no crackles. Normal respiratory effort. No accessory muscle use.  Cardiovascular: S1, S2, tachycardic, no murmurs / rubs / gallops. No extremity edema. 2+ pedal pulses. No carotid bruits.  Abdomen: no tenderness, no masses palpated. No hepatosplenomegaly. Bowel sounds positive.  Musculoskeletal: no clubbing / cyanosis. No joint deformity upper and lower extremities. Good ROM, no contractures. Normal muscle tone.  Skin: no rashes, lesions, ulcers. No induration Neurologic: CN 2-12 grossly intact. Sensation intact, DTR normal. Strength 5/5 in all 4.  Psychiatric: Alert and oriented 3. Appears tearful and depressed.   Labs on Admission: I have personally reviewed following labs and imaging studies  CBC:  Recent Labs Lab 10/26/16 0443  WBC 12.0*  NEUTROABS 8.8*  HGB 12.4  HCT 38.4  MCV 91.6  PLT 159   Basic Metabolic Panel:  Recent Labs Lab 10/26/16 0443  NA 134*  K 3.3*  CL 101  CO2 26  GLUCOSE 116*  BUN 23*  CREATININE 0.96  CALCIUM 9.5   GFR: Estimated Creatinine Clearance: 51.2 mL/min (by C-G formula based on SCr of 0.96 mg/dL). Liver Function Tests:  Recent Labs Lab 10/26/16 0443  AST 23  ALT 13*  ALKPHOS 74  BILITOT 1.4*  PROT 6.9  ALBUMIN 3.6   No results for input(s): LIPASE, AMYLASE in the last 168 hours. No results for input(s): AMMONIA in the last 168 hours. Coagulation Profile: No results for input(s): INR, PROTIME  in the last 168 hours. Cardiac Enzymes:  Recent Labs Lab 10/26/16 0443  TROPONINI 0.03*   BNP (last 3 results) No results for input(s): PROBNP in the last 8760 hours. HbA1C: No results for input(s): HGBA1C in the last 72 hours. CBG: No results for input(s): GLUCAP in the last 168 hours. Lipid Profile: No results for input(s): CHOL, HDL, LDLCALC, TRIG, CHOLHDL, LDLDIRECT in the last 72 hours. Thyroid Function Tests:  Recent Labs  10/26/16 0443  TSH 6.888*   Anemia Panel: No results for input(s): VITAMINB12, FOLATE, FERRITIN, TIBC, IRON, RETICCTPCT in the last 72 hours. Urine analysis:    Component Value Date/Time   COLORURINE AMBER (A) 10/26/2016 0600   APPEARANCEUR HAZY (A) 10/26/2016 0600   LABSPEC 1.023 10/26/2016 0600   PHURINE 5.0 10/26/2016  0600   GLUCOSEU NEGATIVE 10/26/2016 0600   HGBUR MODERATE (A) 10/26/2016 0600   BILIRUBINUR NEGATIVE 10/26/2016 0600   KETONESUR NEGATIVE 10/26/2016 0600   PROTEINUR NEGATIVE 10/26/2016 0600   UROBILINOGEN 0.2 04/16/2014 0347   NITRITE POSITIVE (A) 10/26/2016 0600   LEUKOCYTESUR NEGATIVE 10/26/2016 0600   Sepsis Labs: !!!!!!!!!!!!!!!!!!!!!!!!!!!!!!!!!!!!!!!!!!!! @LABRCNTIP (procalcitonin:4,lacticidven:4) )No results found for this or any previous visit (from the past 240 hour(s)).   Radiological Exams on Admission: Dg Chest 2 View  Result Date: 10/26/2016 CLINICAL DATA:  Altered mental status. Fell yesterday and today. Sore all over body. EXAM: CHEST  2 VIEW COMPARISON:  07/08/2014 FINDINGS: Shallow inspiration with elevation of the right hemidiaphragm. Soft tissue density in the right cardiophrenic angle probably representing prominent cardiac fat pad. No change. No focal airspace disease or consolidation suggested in the lungs. No blunting of costophrenic angles. No pneumothorax. Tortuous aorta. Degenerative changes in the spine. IMPRESSION: Elevation of the right hemidiaphragm. No evidence of active pulmonary disease.  Electronically Signed   By: Burman Nieves M.D.   On: 10/26/2016 05:41   Ct Head Wo Contrast  Result Date: 10/26/2016 CLINICAL DATA:  Initial evaluation for acute altered mental status, recent fall. Diffuse soreness. EXAM: CT HEAD WITHOUT CONTRAST TECHNIQUE: Contiguous axial images were obtained from the base of the skull through the vertex without intravenous contrast. COMPARISON:  None available. FINDINGS: Brain: Generalized age-related cerebral atrophy with mild to moderate chronic microvascular ischemic disease. Small remote right cerebellar infarct noted. No acute intracranial hemorrhage. No evidence for acute large vessel territory infarct. No mass lesion, midline shift or mass effect. No hydrocephalus. No extra-axial fluid collection. Vascular: No hyperdense vessel. Scattered vascular calcifications noted within the carotid siphons. Skull: Scalp soft tissues demonstrate no acute abnormality. Calvarium intact. Sinuses/Orbits: Globes and orbital soft tissues within normal limits. Patient status post lens extraction on the left. Visualized paranasal sinuses are clear. No mastoid effusion. IMPRESSION: 1. No acute intracranial process identified. 2. Generalized age-related cerebral atrophy with mild to moderate chronic microvascular ischemic disease. Electronically Signed   By: Rise Mu M.D.   On: 10/26/2016 05:16   Dg Knee Complete 4 Views Left  Result Date: 10/26/2016 CLINICAL DATA:  Falls yesterday and today.  Bilateral knee pain. EXAM: LEFT KNEE - COMPLETE 4+ VIEW COMPARISON:  None. FINDINGS: Degenerative changes in the left knee with medial greater than lateral compartment narrowing, patellofemoral compartment narrowing, and tricompartment osteophyte formation. Prominent posterior osteophytes. Sclerosis and deformity in the medial tibial plateau is likely degenerative. Medial compartment demonstrates bone-on-bone phenomenon. No significant effusion. No evidence of acute fracture or  dislocation. No destructive bone lesions. Soft tissues are unremarkable. IMPRESSION: Tricompartment degenerative changes in the left knee. No acute bony abnormalities. Electronically Signed   By: Burman Nieves M.D.   On: 10/26/2016 05:43   Dg Knee Complete 4 Views Right  Result Date: 10/26/2016 CLINICAL DATA:  Larey Seat yesterday and today.  Bilateral knee pain. EXAM: RIGHT KNEE - COMPLETE 4+ VIEW COMPARISON:  None. FINDINGS: Right total knee arthroplasty with patellar femoral component. Components appear well seated. No evidence of acute fracture or dislocation. No focal bone lesion or bone destruction. No significant effusion. Soft tissues are unremarkable. IMPRESSION: Right total knee arthroplasty.  No acute bony abnormalities. Electronically Signed   By: Burman Nieves M.D.   On: 10/26/2016 05:42    EKG: Independently reviewed. Sinus tachycardia without ischemic changes.  Assessment/Plan Active Problems:   Essential hypertension   Hyperlipidemia   Orthostasis   UTI (urinary tract  infection)   Fall   Postural dizziness with presyncope   Dehydration    1. Recurrent falls. Possibly related to mild dehydration/UTI. May also have an element of polypharmacy since she is on diazepam and hydrocodone. We'll hold sedative medications. Request physical therapy consultation. She does not have any focal neurologic deficits at this time. Speech was initially slurred on admission, but has improved since arrival.  2. Dehydration/orthostasis. She is taking hydrochlorothiazide at home. Will hold diuretics. Continue IV fluids.  3. Possible UTI. Urinalysis positive for nitrates and bacteria. Started on Rocephin. Follow-up urine culture.  4. Hypertension. Hold ACE inhibitor/hydrochlorothiazide. Continue beta blocker for now.   5. Hyperlipidemia. Continue statin.  6. Polypharmacy. Hold diazepam and hydrocodone for now.   DVT prophylaxis: Lovenox Code Status: DO NOT RESUSCITATE Family Communication:  No family present Disposition Plan: Probable discharge home 2/19. Physical therapy consult requested. Consults called:  Admission status: Observation, telemetry   MEMON,JEHANZEB MD [facility] Pager [phone]  If 7PM-7AM, please contact night-coverage [email or web address] Password [facility]  10/26/2016, 8:27 AM

## 2016-10-26 NOTE — ED Notes (Signed)
CRITICAL VALUE ALERT  Critical value received:  Troponin 0.03  Date of notification:  10/26/16  Time of notification:  0537  Critical value read back:Yes.    Nurse who received alert:  Rudene Anda, RN  Responding MD:  Dr. Manus Gunning  Time MD responded:  [phone]

## 2016-10-26 NOTE — ED Notes (Addendum)
Unable to ambulate patient, patient unsteady on her feet when standing.  Patient uses walker at home and she is currently having pain in her back and has difficulty with standing at this time.  Md notified that we would be unable to ambulate patient.

## 2016-10-26 NOTE — ED Notes (Signed)
PO Fluids given

## 2016-10-26 NOTE — ED Provider Notes (Signed)
[facility]-EMERGENCY DEPT Provider Note   CSN: [id number] Arrival date & time: 10/26/16  0345     History   Chief Complaint Chief Complaint  Patient presents with  . Fall    HPI Christina Frey is a 76 y.o. female.  Patient presents with home after recurrent falls. Reports Patient's Phone Twice in the past 3 Days. She Larey Seat While Trying to Get off the Commode in the Bathroom onto Her Buttocks. Denies Hitting Head or Losing Consciousness. She States She Felt Dizzy from Her Medications Prior to This but Denies Any Dizziness Now. Patient Takes Multiple Sedating Medications Including hydrocodone and Valium. She also takes Neurontin at night. Son states she takes about 3 hydrocodone per day and Valium once a day. This is not been changed recently and patient has normally been tolerating these well. He denies any nausea or vomiting. She denies any dizziness. She normally walks with a walker. She does not have any focal weakness, numbness or tingling. No bowel or bladder incontinence. No chest pain or shortness of breath. Complains of pain in her arms from trying to pull herself off the floor. Also complains of chronic knee pain.   The history is provided by the patient and a relative.    Past Medical History:  Diagnosis Date  . Chest pain, unspecified    NUCLEAR STRESS TEST, 04/23/2006 - low risk scan, normal  . Chronic pain   . Claudication [facility])    LOWER ARTERIAL DUPLEX, 05/27/2006 - normal  . Diabetes mellitus without complication (HCC)   . GERD (gastroesophageal reflux disease)   . Hyperlipidemia   . Hypertension   . Murmur    2D ECHO, 08/20/2007 - EF "normal" (not noted), mild-moderate mitral annular calcification, moderate thickening of the mitral valve, aortic valve moderately sclerotic  . Obesity   . OSA (obstructive sleep apnea)    Unable to wear CPAP  . Osteoarthritis   . PUD (peptic ulcer disease)     Patient Active Problem List   Diagnosis Date Noted  . Lichen sclerosus  10/04/2015  . Morbid obesity (HCC) 07/11/2014  . Abnormal EKG 07/11/2014  . Chest pain at rest 07/08/2014  . PUD (peptic ulcer disease)   . Essential hypertension 05/02/2013  . Hyperlipidemia 05/02/2013  . Obstructive sleep apnea 05/02/2013    Past Surgical History:  Procedure Laterality Date  . TOTAL KNEE ARTHROPLASTY      OB History    Gravida Para Term Preterm AB Living   2 2       2    SAB TAB Ectopic Multiple Live Births                   Home Medications    Prior to Admission medications   Medication Sig Start Date End Date Taking? Authorizing Provider  aspirin 81 MG tablet Take 81 mg by mouth daily.   Yes Historical Provider, MD  BYSTOLIC 5 MG tablet TAKE ONE (1) TABLET BY MOUTH EVERY DAY 08/14/15  Yes Runell Gess, MD  diazepam (VALIUM) 5 MG tablet Take 5 mg by mouth 2 (two) times daily.  09/07/15  Yes Historical Provider, MD  Enalapril-Hydrochlorothiazide 5-12.5 MG tablet Take 1 tablet by mouth daily.   Yes Historical Provider, MD  esomeprazole (NEXIUM) 40 MG capsule Take 40 mg by mouth daily before breakfast.   Yes Historical Provider, MD  fluocinonide-emollient (LIDEX-E) 0.05 % cream Apply 1 application topically 2 (two) times daily. 10/04/15  Yes Jacklyn Shell, CNM  gabapentin (NEURONTIN)  100 MG capsule Take by mouth at bedtime.    Yes Historical Provider, MD  HYDROcodone-acetaminophen (NORCO) 10-325 MG per tablet 1 tablet every 4 (four) hours as needed.  04/17/13  Yes Historical Provider, MD  levothyroxine (SYNTHROID, LEVOTHROID) 100 MCG tablet Take 100 mcg by mouth daily before breakfast.   Yes Historical Provider, MD  pravastatin (PRAVACHOL) 20 MG tablet Take 1 tablet by mouth daily. 06/13/14  Yes Historical Provider, MD    Family History Family History  Problem Relation Age of Onset  . ALS Brother   . Heart disease Brother   . Hypertension Brother   . Arthritis Brother   . Diabetes Sister     Social History Social History  Substance Use  Topics  . Smoking status: Never Smoker  . Smokeless tobacco: Never Used  . Alcohol use No     Allergies   Other and Zetia [ezetimibe]   Review of Systems Review of Systems  Constitutional: Negative for activity change, appetite change, fatigue and fever.  HENT: Negative for congestion and rhinorrhea.   Eyes: Negative for visual disturbance.  Respiratory: Negative for cough, chest tightness and shortness of breath.   Cardiovascular: Negative for chest pain and leg swelling.  Gastrointestinal: Negative for abdominal pain, nausea and vomiting.  Genitourinary: Negative for dysuria, hematuria, vaginal bleeding and vaginal discharge.  Musculoskeletal: Positive for arthralgias and myalgias. Negative for back pain and neck pain.  Skin: Negative for rash.  Neurological: Positive for dizziness, speech difficulty, weakness and light-headedness. Negative for headaches.   A complete 10 system review of systems was obtained and all systems are negative except as noted in the HPI and PMH.    Physical Exam Updated Vital Signs BP (!) 109/50 (BP Location: Left Arm)   Pulse 92   Temp 98.6 F (37 C) (Oral)   Resp 17   Ht 5\' 1"  (1.549 m)   Wt 200 lb (90.7 kg)   SpO2 96%   BMI 37.79 kg/m   Physical Exam  Constitutional: She is oriented to person, place, and time. She appears well-developed and well-nourished. No distress.  Slow and deliberate speech.  HENT:  Head: Normocephalic and atraumatic.  Mouth/Throat: No oropharyngeal exudate.  Dry mucus membranes  Eyes: Conjunctivae and EOM are normal. Pupils are equal, round, and reactive to light.  Neck: Normal range of motion. Neck supple.  No C spine tenderness  Cardiovascular: Normal rate, regular rhythm, normal heart sounds and intact distal pulses.   No murmur heard. Pulmonary/Chest: Effort normal and breath sounds normal. No respiratory distress. She exhibits no tenderness.  Abdominal: Soft. There is no tenderness. There is no rebound  and no guarding.  Musculoskeletal: Normal range of motion. She exhibits no edema or tenderness.  FROM hips without pain. Pain with ROM bilateral knees  Neurological: She is alert and oriented to person, place, and time. No cranial nerve deficit. She exhibits normal muscle tone. Coordination normal.  No ataxia on finger to nose bilaterally. No pronator drift. 5/5 strength throughout. CN 2-12 intact.Equal grip strength. Sensation intact.  Able to lift arms and legs off bed equally. No drift.   Skin: Skin is warm. Capillary refill takes less than 2 seconds.  Psychiatric: She has a normal mood and affect. Her behavior is normal.  Nursing note and vitals reviewed.    ED Treatments / Results  Labs (all labs ordered are listed, but only abnormal results are displayed) Labs Reviewed  CBC WITH DIFFERENTIAL/PLATELET - Abnormal; Notable for the following:  Result Value   WBC 12.0 (*)    Neutro Abs 8.8 (*)    All other components within normal limits  COMPREHENSIVE METABOLIC PANEL - Abnormal; Notable for the following:    Sodium 134 (*)    Potassium 3.3 (*)    Glucose, Bld 116 (*)    BUN 23 (*)    ALT 13 (*)    Total Bilirubin 1.4 (*)    GFR calc non Af Amer 56 (*)    All other components within normal limits  TROPONIN I - Abnormal; Notable for the following:    Troponin I 0.03 (*)    All other components within normal limits  URINALYSIS, ROUTINE W REFLEX MICROSCOPIC - Abnormal; Notable for the following:    Color, Urine AMBER (*)    APPearance HAZY (*)    Hgb urine dipstick MODERATE (*)    Nitrite POSITIVE (*)    Bacteria, UA MANY (*)    All other components within normal limits  RAPID URINE DRUG SCREEN, HOSP PERFORMED - Abnormal; Notable for the following:    Opiates POSITIVE (*)    Benzodiazepines POSITIVE (*)    All other components within normal limits  TSH - Abnormal; Notable for the following:    TSH 6.888 (*)    All other components within normal limits  URINE CULTURE     EKG  EKG Interpretation  Date/Time:  Sunday October 26 2016 04:26:47 EST Ventricular Rate:  109 PR Interval:    QRS Duration: 97 QT Interval:  335 QTC Calculation: 452 R Axis:   127 Text Interpretation:  Sinus tachycardia Probable left atrial enlargement Inferior infarct, old Anterior infarct, old Rate faster Confirmed by Manus Gunning  MD, Tod Abrahamsen [phone]) on 10/26/2016 4:40:25 AM       Radiology Dg Chest 2 View  Result Date: 10/26/2016 CLINICAL DATA:  Altered mental status. Fell yesterday and today. Sore all over body. EXAM: CHEST  2 VIEW COMPARISON:  07/08/2014 FINDINGS: Shallow inspiration with elevation of the right hemidiaphragm. Soft tissue density in the right cardiophrenic angle probably representing prominent cardiac fat pad. No change. No focal airspace disease or consolidation suggested in the lungs. No blunting of costophrenic angles. No pneumothorax. Tortuous aorta. Degenerative changes in the spine. IMPRESSION: Elevation of the right hemidiaphragm. No evidence of active pulmonary disease. Electronically Signed   By: Burman Nieves M.D.   On: 10/26/2016 05:41   Ct Head Wo Contrast  Result Date: 10/26/2016 CLINICAL DATA:  Initial evaluation for acute altered mental status, recent fall. Diffuse soreness. EXAM: CT HEAD WITHOUT CONTRAST TECHNIQUE: Contiguous axial images were obtained from the base of the skull through the vertex without intravenous contrast. COMPARISON:  None available. FINDINGS: Brain: Generalized age-related cerebral atrophy with mild to moderate chronic microvascular ischemic disease. Small remote right cerebellar infarct noted. No acute intracranial hemorrhage. No evidence for acute large vessel territory infarct. No mass lesion, midline shift or mass effect. No hydrocephalus. No extra-axial fluid collection. Vascular: No hyperdense vessel. Scattered vascular calcifications noted within the carotid siphons. Skull: Scalp soft tissues demonstrate no acute  abnormality. Calvarium intact. Sinuses/Orbits: Globes and orbital soft tissues within normal limits. Patient status post lens extraction on the left. Visualized paranasal sinuses are clear. No mastoid effusion. IMPRESSION: 1. No acute intracranial process identified. 2. Generalized age-related cerebral atrophy with mild to moderate chronic microvascular ischemic disease. Electronically Signed   By: Rise Mu M.D.   On: 10/26/2016 05:16   Dg Knee Complete 4 Views Left  Result  Date: 10/26/2016 CLINICAL DATA:  Falls yesterday and today.  Bilateral knee pain. EXAM: LEFT KNEE - COMPLETE 4+ VIEW COMPARISON:  None. FINDINGS: Degenerative changes in the left knee with medial greater than lateral compartment narrowing, patellofemoral compartment narrowing, and tricompartment osteophyte formation. Prominent posterior osteophytes. Sclerosis and deformity in the medial tibial plateau is likely degenerative. Medial compartment demonstrates bone-on-bone phenomenon. No significant effusion. No evidence of acute fracture or dislocation. No destructive bone lesions. Soft tissues are unremarkable. IMPRESSION: Tricompartment degenerative changes in the left knee. No acute bony abnormalities. Electronically Signed   By: Burman Nieves M.D.   On: 10/26/2016 05:43   Dg Knee Complete 4 Views Right  Result Date: 10/26/2016 CLINICAL DATA:  Larey Seat yesterday and today.  Bilateral knee pain. EXAM: RIGHT KNEE - COMPLETE 4+ VIEW COMPARISON:  None. FINDINGS: Right total knee arthroplasty with patellar femoral component. Components appear well seated. No evidence of acute fracture or dislocation. No focal bone lesion or bone destruction. No significant effusion. Soft tissues are unremarkable. IMPRESSION: Right total knee arthroplasty.  No acute bony abnormalities. Electronically Signed   By: Burman Nieves M.D.   On: 10/26/2016 05:42    Procedures Procedures (including critical care time)  Medications Ordered in  ED Medications - No data to display   Initial Impression / Assessment and Plan / ED Course  I have reviewed the triage vital signs and the nursing notes.  Pertinent labs & imaging results that were available during my care of the patient were reviewed by me and considered in my medical decision making (see chart for details).     Recurrent falls, somewhat slow speech.  Likely from polypharmacy. No focal deficits on exam.  Some slurred speech on arrival, improving. IVF given for dry mucus membranes. Orthostatics positive.  Labs with possible UTI, dehydration. Unsteady on feet, required two person assist to stand. CT head stable.  Recurrent falls likely due to dehydration, UTI, polypharmacy.   Will hydrate, treat UTI. Hold sedating meds.  Observation admission d/w Dr. Kerry Hough. Final Clinical Impressions(s) / ED Diagnoses   Final diagnoses:  Recurrent falls  Orthostasis  Urinary tract infection without hematuria, site unspecified  Polypharmacy    New Prescriptions New Prescriptions   No medications on file     Glynn Octave, MD 10/26/16 [phone]

## 2016-10-26 NOTE — ED Notes (Signed)
Patient states that she took her hydrocodone and valium tonight.  Patient drowsy.

## 2016-10-26 NOTE — ED Notes (Signed)
Patient to radiology.

## 2016-10-26 NOTE — ED Notes (Signed)
Patient returned from radiology

## 2016-10-26 NOTE — ED Triage Notes (Signed)
Patient fell yesterday, EMS went to her house and helped her to get up out of the floor.  She would not come to the hospital at that time.  Larey Seat a second time in the afternoon and my son was able to get me up per pt.  I am hurting all over, but mostly my legs, knees, feet, and arms.  I am sore all over.  The first time I fell, I was dizzy from my medication.  I take 15 different medications.  The second time I was getting out of my lift chair and I brought my lift chair up to high and slid out into the floor.  I use a walker at home.

## 2016-10-27 DIAGNOSIS — R296 Repeated falls: Secondary | ICD-10-CM | POA: Diagnosis present

## 2016-10-27 DIAGNOSIS — Z8711 Personal history of peptic ulcer disease: Secondary | ICD-10-CM | POA: Diagnosis not present

## 2016-10-27 DIAGNOSIS — N390 Urinary tract infection, site not specified: Secondary | ICD-10-CM | POA: Diagnosis present

## 2016-10-27 DIAGNOSIS — E1151 Type 2 diabetes mellitus with diabetic peripheral angiopathy without gangrene: Secondary | ICD-10-CM | POA: Diagnosis present

## 2016-10-27 DIAGNOSIS — E785 Hyperlipidemia, unspecified: Secondary | ICD-10-CM | POA: Diagnosis present

## 2016-10-27 DIAGNOSIS — I951 Orthostatic hypotension: Secondary | ICD-10-CM | POA: Diagnosis not present

## 2016-10-27 DIAGNOSIS — R55 Syncope and collapse: Secondary | ICD-10-CM | POA: Diagnosis not present

## 2016-10-27 DIAGNOSIS — E669 Obesity, unspecified: Secondary | ICD-10-CM | POA: Diagnosis present

## 2016-10-27 DIAGNOSIS — R778 Other specified abnormalities of plasma proteins: Secondary | ICD-10-CM | POA: Diagnosis present

## 2016-10-27 DIAGNOSIS — E860 Dehydration: Secondary | ICD-10-CM | POA: Diagnosis not present

## 2016-10-27 DIAGNOSIS — Z66 Do not resuscitate: Secondary | ICD-10-CM | POA: Diagnosis present

## 2016-10-27 DIAGNOSIS — K219 Gastro-esophageal reflux disease without esophagitis: Secondary | ICD-10-CM | POA: Diagnosis present

## 2016-10-27 DIAGNOSIS — E039 Hypothyroidism, unspecified: Secondary | ICD-10-CM | POA: Diagnosis present

## 2016-10-27 DIAGNOSIS — B961 Klebsiella pneumoniae [K. pneumoniae] as the cause of diseases classified elsewhere: Secondary | ICD-10-CM | POA: Diagnosis present

## 2016-10-27 DIAGNOSIS — Z96651 Presence of right artificial knee joint: Secondary | ICD-10-CM | POA: Diagnosis present

## 2016-10-27 DIAGNOSIS — Z7982 Long term (current) use of aspirin: Secondary | ICD-10-CM | POA: Diagnosis not present

## 2016-10-27 DIAGNOSIS — Z888 Allergy status to other drugs, medicaments and biological substances status: Secondary | ICD-10-CM | POA: Diagnosis not present

## 2016-10-27 DIAGNOSIS — W1811XA Fall from or off toilet without subsequent striking against object, initial encounter: Secondary | ICD-10-CM | POA: Diagnosis present

## 2016-10-27 DIAGNOSIS — I10 Essential (primary) hypertension: Secondary | ICD-10-CM | POA: Diagnosis not present

## 2016-10-27 DIAGNOSIS — R4781 Slurred speech: Secondary | ICD-10-CM | POA: Diagnosis present

## 2016-10-27 DIAGNOSIS — Z79899 Other long term (current) drug therapy: Secondary | ICD-10-CM | POA: Diagnosis not present

## 2016-10-27 DIAGNOSIS — G8929 Other chronic pain: Secondary | ICD-10-CM | POA: Diagnosis present

## 2016-10-27 DIAGNOSIS — Z6838 Body mass index (BMI) 38.0-38.9, adult: Secondary | ICD-10-CM | POA: Diagnosis not present

## 2016-10-27 DIAGNOSIS — G4733 Obstructive sleep apnea (adult) (pediatric): Secondary | ICD-10-CM | POA: Diagnosis present

## 2016-10-27 LAB — COMPREHENSIVE METABOLIC PANEL
ALK PHOS: 54 U/L (ref 38–126)
ALT: 10 U/L — AB (ref 14–54)
AST: 15 U/L (ref 15–41)
Albumin: 2.6 g/dL — ABNORMAL LOW (ref 3.5–5.0)
Anion gap: 7 (ref 5–15)
BILIRUBIN TOTAL: 1 mg/dL (ref 0.3–1.2)
BUN: 9 mg/dL (ref 6–20)
CALCIUM: 8.5 mg/dL — AB (ref 8.9–10.3)
CHLORIDE: 108 mmol/L (ref 101–111)
CO2: 23 mmol/L (ref 22–32)
CREATININE: 0.59 mg/dL (ref 0.44–1.00)
GFR calc Af Amer: >60 mL/min (ref >60)
Glucose, Bld: 116 mg/dL — ABNORMAL HIGH (ref 65–99)
Potassium: 4.2 mmol/L (ref 3.5–5.1)
Sodium: 138 mmol/L (ref 135–145)
TOTAL PROTEIN: 5.2 g/dL — AB (ref 6.5–8.1)

## 2016-10-27 LAB — CBC
HCT: 32.6 % — ABNORMAL LOW (ref 36.0–46.0)
HEMOGLOBIN: 10.7 g/dL — AB (ref 12.0–15.0)
MCH: 30.3 pg (ref 26.0–34.0)
MCHC: 32.8 g/dL (ref 30.0–36.0)
MCV: 92.4 fL (ref 78.0–100.0)
Platelets: 133 10*3/uL — ABNORMAL LOW (ref 150–400)
RBC: 3.53 MIL/uL — AB (ref 3.87–5.11)
RDW: 14.4 % (ref 11.5–15.5)
WBC: 5.8 10*3/uL (ref 4.0–10.5)

## 2016-10-27 NOTE — Care Management Obs Status (Signed)
MEDICARE OBSERVATION STATUS NOTIFICATION   Patient Details  Name: Christina Frey MRN: [id number] Date of Birth: 1941-08-07   Medicare Observation Status Notification Given:  Yes    Malcolm Metro, RN 10/27/2016, 1:07 PM

## 2016-10-27 NOTE — Progress Notes (Signed)
PROGRESS NOTE    Christina PRESSLY  Frey:[id number] DOB: 1941/01/03 DOA: 10/26/2016 PCP: Cassell Smiles., MD   Brief Narrative:  Christina Frey is a 76 y.o. female with medical history significant of hypertension, hyperlipidemia, chronic pain who is on multiple medications. According to patient, she has fallen 3 times in the past week. She feels dizzy and lightheaded upon standing for the past week. By mouth intake has been decreased. She's not had any fever, cough, vomiting, diarrhea, dysuria. No shortness of breath or chest pain. She has not been started on any new medications lately. She is generally weak. Mildly orthostatic in the emergency room with heart rate increasing standing. She required 2 people to help her stand and was unable to ambulate. Lab work indicated mild dehydration with elevated BUN . urinalysis indicated possible UTI. EKG did not show any acute changes, troponin was mildly elevated at 0.03. CT head, chest x-ray and x-rays of her knees are unremarkable. She's been referred for further admission.  Assessment & Plan:   Active Problems:   Essential hypertension   Hyperlipidemia   Orthostasis   UTI (urinary tract infection)   Fall   Postural dizziness with presyncope   Dehydration  1. Recurrent falls. likely 2/2 mild dehydration/UTI/polypharmacy-on diazepam and hydrocodone.  - Cont hold sedatives  - physical therapy - home health  - Treat UTI - ceftriazone - Reduce IV fluids to 50 mL/hr  - Check orthostatic vitals  - Hold diuretics  2. Possible UTI. Urinalysis positive for nitrates and bacteria. Started on Rocephin. WBC 12>>5. - >100 000 colonies of Klebsiella pneumonia  - Await sensitivities to ensure its extended spectrum beta lactamase resistant  4. Hypertension. Blood pressure soft to normal, holding ACE inhibitor/hydrochlorothiazide. -  Continue beta blocker for now.   5. Hyperlipidemia. Continue statin.  6. Polypharmacy. Hold diazepam- for sleep and  oxycodone 10  for now.  - We will need to follow up with for poly pharmacy - Consider reducing oxycodone and Valium dose at the time of discharge  7. Mild Elevated troponin 0.03, flat/reduced to <0.03. - No cardiac symptoms - EKG on admission no ST or T-wave abnormalities to suggest ACS.  8. Elevated TSH-On treatment for hypothyroidism. - F/u as outpt.   DVT prophylaxis: Lovenox Code Status: DO NOT RESUSCITATE Family Communication: Son present Disposition Plan: Probable discharge home 2/20, awaiting urine sensitivities, ensure its not ESBL resistant.  Consults called: None Admission status: Observation, telemetry  Procedures: None   Antimicrobials:  - ceftriazone- 1/18  Subjective: Patient's son present at bedside. Patient asking when she will be going home. Denies dizziness or chest pain. Explained to patient why we think she fell. And the need to have her medications reviewed.   Objective: Vitals:   10/26/16 1423 10/26/16 2223 10/27/16 0640 10/27/16 1342  BP: (!) 104/57 (!) 106/32 107/60 (!) 131/52  Pulse: 81 83 70 96  Resp: 16 18 18 20   Temp: 98.6 F (37 C) 98.4 F (36.9 C) 98.7 F (37.1 C) 98.3 F (36.8 C)  TempSrc: Oral Oral Oral Oral  SpO2: 99% 98%  96%  Weight:      Height:        Intake/Output Summary (Last 24 hours) at 10/27/16 1507 Last data filed at 10/27/16 1343  Gross per 24 hour  Intake              720 ml  Output             1400 ml  Net             -  680 ml   Filed Weights   10/26/16 0355 10/26/16 1015  Weight: 90.7 kg (200 lb) 91.3 kg (201 lb 4.5 oz)    Examination:  General exam: Appears calm and comfortable  Respiratory system: Clear to auscultation. Respiratory effort normal. Cardiovascular system: S1 & S2 heard, RRR. No JVD, murmurs, rubs, gallops or clicks. No pedal edema. Gastrointestinal system: Abdomen is nondistended, soft and nontender. No organomegaly or masses felt. Normal bowel sounds heard. Central nervous system: Alert and  oriented. No focal neurological deficits. Extremities: Symmetric 5 x 5 power, well-healed surgical scar on the right knee Skin: No rashes, lesions or ulcers Psychiatry: Judgement and insight appear normal. Mood & affect appropriate.   Data Reviewed:  CBC:  Recent Labs Lab 10/26/16 0443 10/27/16 0529  WBC 12.0* 5.8  NEUTROABS 8.8*  --   HGB 12.4 10.7*  HCT 38.4 32.6*  MCV 91.6 92.4  PLT 159 133*   Basic Metabolic Panel:  Recent Labs Lab 10/26/16 0443 10/27/16 0529  NA 134* 138  K 3.3* 4.2  CL 101 108  CO2 26 23  GLUCOSE 116* 116*  BUN 23* 9  CREATININE 0.96 0.59  CALCIUM 9.5 8.5*   Liver Function Tests:  Recent Labs Lab 10/26/16 0443 10/27/16 0529  AST 23 15  ALT 13* 10*  ALKPHOS 74 54  BILITOT 1.4* 1.0  PROT 6.9 5.2*  ALBUMIN 3.6 2.6*   Cardiac Enzymes:  Recent Labs Lab 10/26/16 0443 10/26/16 1227 10/26/16 1917 10/26/16 2306  TROPONINI 0.03* <0.03 <0.03 <0.03   Thyroid Function Tests:  Recent Labs  10/26/16 0443  TSH 6.888*   Sepsis Labs: No results for input(s): PROCALCITON, LATICACIDVEN in the last 168 hours.  Recent Results (from the past 240 hour(s))  Urine culture     Status: Abnormal (Preliminary result)   Collection Time: 10/26/16  6:00 AM  Result Value Ref Range Status   Specimen Description URINE, CATHETERIZED  Final   Special Requests NONE  Final   Culture >=100,000 COLONIES/mL KLEBSIELLA PNEUMONIAE (A)  Final   Report Status PENDING  Incomplete     Radiology Studies: Dg Chest 2 View  Result Date: 10/26/2016 CLINICAL DATA:  Altered mental status. Fell yesterday and today. Sore all over body. EXAM: CHEST  2 VIEW COMPARISON:  07/08/2014 FINDINGS: Shallow inspiration with elevation of the right hemidiaphragm. Soft tissue density in the right cardiophrenic angle probably representing prominent cardiac fat pad. No change. No focal airspace disease or consolidation suggested in the lungs. No blunting of costophrenic angles. No  pneumothorax. Tortuous aorta. Degenerative changes in the spine. IMPRESSION: Elevation of the right hemidiaphragm. No evidence of active pulmonary disease. Electronically Signed   By: Burman Nieves M.D.   On: 10/26/2016 05:41   Ct Head Wo Contrast  Result Date: 10/26/2016 CLINICAL DATA:  Initial evaluation for acute altered mental status, recent fall. Diffuse soreness. EXAM: CT HEAD WITHOUT CONTRAST TECHNIQUE: Contiguous axial images were obtained from the base of the skull through the vertex without intravenous contrast. COMPARISON:  None available. FINDINGS: Brain: Generalized age-related cerebral atrophy with mild to moderate chronic microvascular ischemic disease. Small remote right cerebellar infarct noted. No acute intracranial hemorrhage. No evidence for acute large vessel territory infarct. No mass lesion, midline shift or mass effect. No hydrocephalus. No extra-axial fluid collection. Vascular: No hyperdense vessel. Scattered vascular calcifications noted within the carotid siphons. Skull: Scalp soft tissues demonstrate no acute abnormality. Calvarium intact. Sinuses/Orbits: Globes and orbital soft tissues within normal limits. Patient status post lens  extraction on the left. Visualized paranasal sinuses are clear. No mastoid effusion. IMPRESSION: 1. No acute intracranial process identified. 2. Generalized age-related cerebral atrophy with mild to moderate chronic microvascular ischemic disease. Electronically Signed   By: Rise Mu M.D.   On: 10/26/2016 05:16   Dg Knee Complete 4 Views Left  Result Date: 10/26/2016 CLINICAL DATA:  Falls yesterday and today.  Bilateral knee pain. EXAM: LEFT KNEE - COMPLETE 4+ VIEW COMPARISON:  None. FINDINGS: Degenerative changes in the left knee with medial greater than lateral compartment narrowing, patellofemoral compartment narrowing, and tricompartment osteophyte formation. Prominent posterior osteophytes. Sclerosis and deformity in the medial  tibial plateau is likely degenerative. Medial compartment demonstrates bone-on-bone phenomenon. No significant effusion. No evidence of acute fracture or dislocation. No destructive bone lesions. Soft tissues are unremarkable. IMPRESSION: Tricompartment degenerative changes in the left knee. No acute bony abnormalities. Electronically Signed   By: Burman Nieves M.D.   On: 10/26/2016 05:43   Dg Knee Complete 4 Views Right  Result Date: 10/26/2016 CLINICAL DATA:  Larey Seat yesterday and today.  Bilateral knee pain. EXAM: RIGHT KNEE - COMPLETE 4+ VIEW COMPARISON:  None. FINDINGS: Right total knee arthroplasty with patellar femoral component. Components appear well seated. No evidence of acute fracture or dislocation. No focal bone lesion or bone destruction. No significant effusion. Soft tissues are unremarkable. IMPRESSION: Right total knee arthroplasty.  No acute bony abnormalities. Electronically Signed   By: Burman Nieves M.D.   On: 10/26/2016 05:42    Scheduled Meds: . aspirin EC  81 mg Oral Daily  . cefTRIAXone (ROCEPHIN)  IV  1 g Intravenous Q24H  . enoxaparin (LOVENOX) injection  40 mg Subcutaneous Q24H  . fluocinonide cream  1 application Topical BID  . gabapentin  100 mg Oral QHS  . levothyroxine  100 mcg Oral QAC breakfast  . nebivolol  5 mg Oral Daily  . pantoprazole  40 mg Oral Daily  . pravastatin  20 mg Oral Daily   Continuous Infusions: . 0.9 % NaCl with KCl 20 mEq / L 100 mL/hr at 10/27/16 1134     LOS: 0 days   Onnie Boer, MD [facility] Pager [phone]  If 7PM-7AM, please contact night-coverage [email or web address] Password [email or web address] 10/27/2016, 3:07 PM

## 2016-10-27 NOTE — Care Management Note (Signed)
Case Management Note  Patient Details  Name: Christina Frey MRN: [id number] Date of Birth: [birth year]  Subjective/Objective:                  Pt is from home, lives with her son and is ind with ADL's. She uses a walker with ambulation. She has PCP, transportation and no difficulty affording medications. PT has recommended HH PT and pt is agreeable. Alroy Bailiff, of [facility], per pt choice, is aware of referral and will obtain pt info from chart. Pt aware HH has 48hrs to make first visit. Pt's son at bedside for DC planning.   Action/Plan: Pt discharging home today with HH PT.   Expected Discharge Date:       10/27/2016           Expected Discharge Plan:  Home w Home Health Services  In-House Referral:  NA  Discharge planning Services  CM Consult  Post Acute Care Choice:  Home Health Choice offered to:  Patient  HH Arranged:  RN, PT [facility] Agency:  [facility]  Status of Service:  Completed, signed off  Malcolm Metro, RN 10/27/2016, 1:48 PM

## 2016-10-27 NOTE — Evaluation (Signed)
Physical Therapy Evaluation Patient Details Name: Christina Frey MRN: [id number] DOB: 08/09/1941 Today's Date: 10/27/2016   History of Present Illness  76 y.o. female with medical history significant of hypertension, hyperlipidemia, chronic pain who is on multiple medications. According to patient, she has fallen 3 times in the past week. She feels dizzy and lightheaded upon standing for the past week. By mouth intake has been decreased. She's not had any fever, cough, vomiting, diarrhea, dysuria. No shortness of breath or chest pain. She has not been started on any new medications lately. She is generally weak.  Recurrent falls. Possibly related to mild dehydration/UTI. May also have an element of polypharmacy    Clinical Impression  Pt received sitting up in the chair, and son present.  Pt is agreeable to PT evaluation.  Pt states that normally at home she ambulates with her RW, and she is independent with ADL's, although it takes her a long time.  During PT evaluation she demonstrates sit<>stand at Mod (I) level with RW, and ambulated 167f with supervision and RW.  She does have a history of falls, as well as decreased gait speed and altered gait mechanics, therefore, she is recommended for HHPT upon d/c.      Follow Up Recommendations Home health PT    Equipment Recommendations  None recommended by PT    Recommendations for Other Services       Precautions / Restrictions Precautions Precautions: Fall Precaution Comments: Pt has had 3 falls recently when getting up from the toilet, and then also when getting up from her lift chair.  Restrictions Weight Bearing Restrictions: No      Mobility  Bed Mobility                  Transfers Overall transfer level: Modified independent Equipment used: Rolling walker (2 wheeled) Transfers: Sit to/from Stand Sit to Stand: Modified independent (Device/Increase time)         General transfer comment: vc's to reach back for the  chair upon lowering down.   Ambulation/Gait Ambulation/Gait assistance: Supervision Ambulation Distance (Feet): 100 Feet Assistive device: Rolling walker (2 wheeled) Gait Pattern/deviations: Step-through pattern;Antalgic        Stairs            Wheelchair Mobility    Modified Rankin (Stroke Patients Only)       Balance Overall balance assessment: History of Falls;Needs assistance Sitting-balance support: Bilateral upper extremity supported;Feet supported Sitting balance-Leahy Scale: Normal     Standing balance support: Bilateral upper extremity supported Standing balance-Leahy Scale: Good                               Pertinent Vitals/Pain Pain Assessment: 0-10 Pain Score: 10-Worst pain ever Pain Location: All over since the fall.  Pain Descriptors / Indicators: Aching Pain Intervention(s): Limited activity within patient's tolerance;Monitored during session;Repositioned    Home Living   Living Arrangements: Children (son) Available Help at Discharge: Other (Comment) (Son is at the house with her 90% of the time) Type of Home: House Home Access: Stairs to enter;Ramped entrance     Home Layout: One level Home Equipment: Walker - 2 wheels;Grab bars - tub/shower;Cane - single point      Prior Function Level of Independence: Independent with assistive device(s)   Gait / Transfers Assistance Needed: Pt uses RW with tennis balls on the back for ambulation  ADL's / Homemaking Assistance Needed: Pt states she  is independent with ADL's, although it takes a long time.         Hand Dominance        Extremity/Trunk Assessment   Upper Extremity Assessment Upper Extremity Assessment: Generalized weakness    Lower Extremity Assessment Lower Extremity Assessment: Generalized weakness       Communication      Cognition Arousal/Alertness: Awake/alert Behavior During Therapy: WFL for tasks assessed/performed Overall Cognitive Status: Within  Functional Limits for tasks assessed                      General Comments      Exercises     Assessment/Plan    PT Assessment All further PT needs can be met in the next venue of care  PT Problem List Decreased strength;Decreased activity tolerance;Decreased balance;Decreased mobility;Obesity;Pain       PT Treatment Interventions      PT Goals (Current goals can be found in the Care Plan section)  Acute Rehab PT Goals Patient Stated Goal: Pt wants to go home.  PT Goal Formulation: All assessment and education complete, DC therapy    Frequency     Barriers to discharge        Co-evaluation               End of Session Equipment Utilized During Treatment: Gait belt Activity Tolerance: Patient tolerated treatment well Patient left: in chair;with call bell/phone within reach;with family/visitor present Nurse Communication: Mobility status PT Visit Diagnosis: History of falling (Z91.81);Muscle weakness (generalized) (M62.81);Difficulty in walking, not elsewhere classified (R26.2)    Functional Assessment Tool Used: AM-PAC 6 Clicks Basic Mobility Functional Limitation: Mobility: Walking and moving around Mobility: Walking and Moving Around Current Status (R5188): At least 20 percent but less than 40 percent impaired, limited or restricted Mobility: Walking and Moving Around Goal Status [phone]): At least 1 percent but less than 20 percent impaired, limited or restricted    Time: 6301-6010 PT Time Calculation (min) (ACUTE ONLY): 24 min   Charges:   PT Evaluation $PT Eval Low Complexity: 1 Procedure PT Treatments $Gait Training: 8-22 mins   PT G Codes:   PT G-Codes **NOT FOR INPATIENT CLASS** Functional Assessment Tool Used: AM-PAC 6 Clicks Basic Mobility Functional Limitation: Mobility: Walking and moving around Mobility: Walking and Moving Around Current Status (X3235): At least 20 percent but less than 40 percent impaired, limited or  restricted Mobility: Walking and Moving Around Goal Status [phone]): At least 1 percent but less than 20 percent impaired, limited or restricted     Beth Samual Beals, PT, DPT X: [phone]

## 2016-10-28 LAB — URINE CULTURE: Culture: >=100000 — AB

## 2016-10-28 MED ORDER — OXYCODONE HCL 10 MG PO TABS
5.0000 mg | ORAL_TABLET | Freq: Four times a day (QID) | ORAL | 0 refills | Status: DC | PRN
Start: 2016-10-28 — End: 2020-08-08

## 2016-10-28 MED ORDER — DIAZEPAM 10 MG PO TABS: 5.0000 mg | ORAL_TABLET | Freq: Two times a day (BID) | ORAL | 0 refills | Status: DC | PRN

## 2016-10-28 NOTE — Progress Notes (Signed)
Patient states understanding of discharge instructions, prescriptions given 

## 2016-10-28 NOTE — Discharge Summary (Addendum)
Physician Discharge Summary  Christina Frey IOM:[id number] DOB: 23-Apr-1941 DOA: 10/26/2016  PCP: Christina Smiles., MD  Admit date: 10/26/2016 Discharge date: 10/28/2016  Admitted From: Home Disposition: Home   Recommendations for Outpatient Follow-up:  1. Follow up with PCP in 1 weeks 2. Please obtain BMP/CBC in one week  Home Health: yes  Equipment/Devices:  No   Discharge Condition: Stable  CODE STATUS:   Full   Diet recommendation: Heart Healthy   HPI- on admission by Erick Blinks, MD.  HPI: Christina Frey is a 76 y.o. female with medical history significant of hypertension, hyperlipidemia, chronic pain who is on multiple medications. According to patient, she has fallen 3 times in the past week. She feels dizzy and lightheaded upon standing for the past week. By mouth intake has been decreased. She's not had any fever, cough, vomiting, diarrhea, dysuria. No shortness of breath or chest pain. She has not been started on any new medications lately. She is generally weak.  ED Course: She was noted to be mildly orthostatic in the emergency room with heart rate increasing standing. She required 2 people to help her stand and was unable to ambulate. Lab work indicated mild dehydration with elevated BUN . urinalysis indicated possible UTI. EKG did not show any acute changes, troponin was mildly elevated at 0.03. CT head, chest x-ray and x-rays of her knees are unremarkable. She's been referred for further admission.  Discharge Diagnoses:  Active Problems:   Essential hypertension   Hyperlipidemia   Orthostasis   UTI (urinary tract infection)   Fall   Postural dizziness with presyncope   Dehydration  1. Recurrent falls. likely 2/2 mild dehydration/UTI/polypharmacy- She had mildly orthostatic on admission, troponins- see below, EKG- not suggestive of ACS, mild hypokalemia - 3.3, UDS positive for opiates and benzodiazepines, and the elevated white blood count of 12. Chest x-ray showed  only elevation of the right hemidiaphragm, x-ray showed tricompartmental degenerative changes in the left knee, and right total knee arthroplasty. Head CT done- negative for acute abnormality. Patient was given IV fluids, sedatives were held on admission, antibiotics, diuretics were held. Physical therapy recommended home health physical therapy. On discharge patient was doing better, orthostatic vitals repeatedly negative.  2.UTI. Urine cultures grew more than 100,000 colonies of Klebsiella pneumonia. Her blood count of 12 trended down to 5.8. Patient was treated IV ceftriaxone in the hospital, to complete 3 days while sensitivities showed susceptibility to ceftriaxone.  - 3. Hypertension. Blood pressure medications initially held. On discharge patient's home imdur and losartan 25 mg daily were restarted. Patient to follow-up with PCP for blood pressure check. Doubt need for ACE-inh while patient is on ARB.  4 . Polypharmacy. Patient expresses need for diazepam and oxycodone. Told her this would increase her risk for continued falls. Recommended reducing the dose of oxycodone to 5 mg every 6 hourly PRN, diazepam 25 mg twice a day when necessary. Pt agreed. Discharge medications reflected that this.  5. Mild Elevated troponin 0.03, flat/reduced to <0.03 X2.  No cardiac symptoms. EKG on admission no ST or T-wave abnormalities to suggest ACS.  6. Elevated TSH- 6.88. On treatment for hypothyroidism. To follow up as outpt.   7. Mild Hgb Drop- From 12.4 to 10.7. No blood loss on admission. Likely due to hemodilution. F/u with PCP for CBC check.  Discharge Instructions  Discharge Instructions    Diet - low sodium heart healthy    Complete by:  As directed    Discharge instructions  Complete by:  As directed    I have stopped one of your blood pressure medications called- Enalpril-hydrochlorothiazide, as your blood pressure was normal here with this medication. Please follow up with your  primary care doctor in 1 week, for blood pressure check.   Also you do not need any more antibiotics as you completed three doses of your antibiotic here in the hospital.  Also to help reduce your risk of falling, I have reduced the dose of your valium by half and the dose of your pain medication to 5mg  every 6 hours. Please follow up with your primary care provider. Consider topical medications like diclofenac gel for pain.   Increase activity slowly    Complete by:  As directed      Allergies as of 10/28/2016      Reactions   Other    Anti-inflammatory's-causes stomach ulcers   Zetia [ezetimibe] Other (See Comments)   Lightheadedness      Medication List    STOP taking these medications   Enalapril-Hydrochlorothiazide 5-12.5 MG tablet   HYDROcodone-acetaminophen 10-325 MG tablet Commonly known as:  NORCO     TAKE these medications   aspirin 81 MG tablet Take 81 mg by mouth daily.   bimatoprost 0.01 % Soln Commonly known as:  LUMIGAN Place 1 drop into the left eye at bedtime.   BYSTOLIC 5 MG tablet Generic drug:  nebivolol TAKE ONE (1) TABLET BY MOUTH EVERY DAY   diazepam 10 MG tablet Commonly known as:  VALIUM Take 0.5 tablets (5 mg total) by mouth every 12 (twelve) hours as needed for anxiety. What changed:  how much to take  when to take this  reasons to take this   esomeprazole 40 MG capsule Commonly known as:  NEXIUM Take 40 mg by mouth daily before breakfast.   fluocinonide-emollient 0.05 % cream Commonly known as:  LIDEX-E Apply 1 application topically 2 (two) times daily.   gabapentin 300 MG capsule Commonly known as:  NEURONTIN Take 1 capsule by mouth at bedtime.   isosorbide mononitrate 30 MG 24 hr tablet Commonly known as:  IMDUR Take 1 tablet by mouth daily.   levothyroxine 100 MCG tablet Commonly known as:  SYNTHROID, LEVOTHROID Take 100 mcg by mouth daily before breakfast.   losartan 25 MG tablet Commonly known as:  COZAAR Take 1  tablet by mouth daily.   Oxycodone HCl 10 MG Tabs Take 0.5 tablets (5 mg total) by mouth every 6 (six) hours as needed. What changed:  how much to take  when to take this   potassium chloride 10 MEQ tablet Commonly known as:  K-DUR Take 1 tablet by mouth 2 (two) times daily.   pravastatin 20 MG tablet Commonly known as:  PRAVACHOL Take 1 tablet by mouth daily.      Follow-up Information    [facility] Follow up.   Contact information: [address] [place] [address] [phone]        Christina Smiles., MD Follow up in 2 week(s).   Specialty:  Internal Medicine Contact information: [address] [place] [address] [phone]          Allergies  Allergen Reactions  . Other     Anti-inflammatory's-causes stomach ulcers  . Zetia [Ezetimibe] Other (See Comments)    Lightheadedness     Consultations:  None  Procedures/Studies: Dg Chest 2 View  Result Date: 10/26/2016 CLINICAL DATA:  Altered mental status. Fell yesterday and today. Sore all over  body. EXAM: CHEST  2 VIEW COMPARISON:  07/08/2014 FINDINGS: Shallow inspiration with elevation of the right hemidiaphragm. Soft tissue density in the right cardiophrenic angle probably representing prominent cardiac fat pad. No change. No focal airspace disease or consolidation suggested in the lungs. No blunting of costophrenic angles. No pneumothorax. Tortuous aorta. Degenerative changes in the spine. IMPRESSION: Elevation of the right hemidiaphragm. No evidence of active pulmonary disease. Electronically Signed   By: Burman Nieves M.D.   On: 10/26/2016 05:41   Ct Head Wo Contrast  Result Date: 10/26/2016 CLINICAL DATA:  Initial evaluation for acute altered mental status, recent fall. Diffuse soreness. EXAM: CT HEAD WITHOUT CONTRAST TECHNIQUE: Contiguous axial images were obtained from the base of the skull through the vertex without intravenous contrast. COMPARISON:  None  available. FINDINGS: Brain: Generalized age-related cerebral atrophy with mild to moderate chronic microvascular ischemic disease. Small remote right cerebellar infarct noted. No acute intracranial hemorrhage. No evidence for acute large vessel territory infarct. No mass lesion, midline shift or mass effect. No hydrocephalus. No extra-axial fluid collection. Vascular: No hyperdense vessel. Scattered vascular calcifications noted within the carotid siphons. Skull: Scalp soft tissues demonstrate no acute abnormality. Calvarium intact. Sinuses/Orbits: Globes and orbital soft tissues within normal limits. Patient status post lens extraction on the left. Visualized paranasal sinuses are clear. No mastoid effusion. IMPRESSION: 1. No acute intracranial process identified. 2. Generalized age-related cerebral atrophy with mild to moderate chronic microvascular ischemic disease. Electronically Signed   By: Rise Mu M.D.   On: 10/26/2016 05:16   Dg Knee Complete 4 Views Left  Result Date: 10/26/2016 CLINICAL DATA:  Falls yesterday and today.  Bilateral knee pain. EXAM: LEFT KNEE - COMPLETE 4+ VIEW COMPARISON:  None. FINDINGS: Degenerative changes in the left knee with medial greater than lateral compartment narrowing, patellofemoral compartment narrowing, and tricompartment osteophyte formation. Prominent posterior osteophytes. Sclerosis and deformity in the medial tibial plateau is likely degenerative. Medial compartment demonstrates bone-on-bone phenomenon. No significant effusion. No evidence of acute fracture or dislocation. No destructive bone lesions. Soft tissues are unremarkable. IMPRESSION: Tricompartment degenerative changes in the left knee. No acute bony abnormalities. Electronically Signed   By: Burman Nieves M.D.   On: 10/26/2016 05:43   Dg Knee Complete 4 Views Right  Result Date: 10/26/2016 CLINICAL DATA:  Larey Seat yesterday and today.  Bilateral knee pain. EXAM: RIGHT KNEE - COMPLETE 4+ VIEW  COMPARISON:  None. FINDINGS: Right total knee arthroplasty with patellar femoral component. Components appear well seated. No evidence of acute fracture or dislocation. No focal bone lesion or bone destruction. No significant effusion. Soft tissues are unremarkable. IMPRESSION: Right total knee arthroplasty.  No acute bony abnormalities. Electronically Signed   By: Burman Nieves M.D.   On: 10/26/2016 05:42     Subjective: Complaints today ready to go home son at bedside questions answered. Talked about home Valium and oxycodone medication changes.  Discharge Exam: Vitals:   10/27/16 2123 10/28/16 0659  BP: 126/60 140/79  Pulse: 67 77  Resp: 18 18  Temp: 98 F (36.7 C) 98 F (36.7 C)   Vitals:   10/27/16 1700 10/27/16 1704 10/27/16 2123 10/28/16 0659  BP:   126/60 140/79  Pulse:   67 77  Resp:   18 18  Temp:   98 F (36.7 C) 98 F (36.7 C)  TempSrc:   Oral Oral  SpO2: 99% 100% 98% 99%  Weight:      Height:        General: Pt  is alert, awake, not in acute distress Cardiovascular: RRR, S1/S2 +, 2/6 systolic murmur, no rubs, no gallops Respiratory: CTA bilaterally, no wheezing, no rhonchi Abdominal: Soft, NT, ND, bowel sounds + Extremities: no edema, no cyanosis  The results of significant diagnostics from this hospitalization (including imaging, microbiology, ancillary and laboratory) are listed below for reference.     Microbiology: Recent Results (from the past 240 hour(s))  Urine culture     Status: Abnormal   Collection Time: 10/26/16  6:00 AM  Result Value Ref Range Status   Specimen Description URINE, CATHETERIZED  Final   Special Requests NONE  Final   Culture >=100,000 COLONIES/mL KLEBSIELLA PNEUMONIAE (A)  Final   Report Status 10/28/2016 FINAL  Final   Organism ID, Bacteria KLEBSIELLA PNEUMONIAE (A)  Final      Susceptibility   Klebsiella pneumoniae - MIC*    AMPICILLIN >=32 RESISTANT Resistant     CEFAZOLIN <=4 SENSITIVE Sensitive     CEFTRIAXONE <=1  SENSITIVE Sensitive     CIPROFLOXACIN <=0.25 SENSITIVE Sensitive     GENTAMICIN <=1 SENSITIVE Sensitive     IMIPENEM <=0.25 SENSITIVE Sensitive     NITROFURANTOIN 64 INTERMEDIATE Intermediate     TRIMETH/SULFA <=20 SENSITIVE Sensitive     AMPICILLIN/SULBACTAM 4 SENSITIVE Sensitive     PIP/TAZO <=4 SENSITIVE Sensitive     Extended ESBL NEGATIVE Sensitive     * >=100,000 COLONIES/mL KLEBSIELLA PNEUMONIAE     Labs: Basic Metabolic Panel:  Recent Labs Lab 10/26/16 0443 10/27/16 0529  NA 134* 138  K 3.3* 4.2  CL 101 108  CO2 26 23  GLUCOSE 116* 116*  BUN 23* 9  CREATININE 0.96 0.59  CALCIUM 9.5 8.5*   Liver Function Tests:  Recent Labs Lab 10/26/16 0443 10/27/16 0529  AST 23 15  ALT 13* 10*  ALKPHOS 74 54  BILITOT 1.4* 1.0  PROT 6.9 5.2*  ALBUMIN 3.6 2.6*   CBC:  Recent Labs Lab 10/26/16 0443 10/27/16 0529  WBC 12.0* 5.8  NEUTROABS 8.8*  --   HGB 12.4 10.7*  HCT 38.4 32.6*  MCV 91.6 92.4  PLT 159 133*   Cardiac Enzymes:  Recent Labs Lab 10/26/16 0443 10/26/16 1227 10/26/16 1917 10/26/16 2306  TROPONINI 0.03* <0.03 <0.03 <0.03   Thyroid function studies  Recent Labs  10/26/16 0443  TSH 6.888*   Urinalysis    Component Value Date/Time   COLORURINE AMBER (A) 10/26/2016 0600   APPEARANCEUR HAZY (A) 10/26/2016 0600   LABSPEC 1.023 10/26/2016 0600   PHURINE 5.0 10/26/2016 0600   GLUCOSEU NEGATIVE 10/26/2016 0600   HGBUR MODERATE (A) 10/26/2016 0600   BILIRUBINUR NEGATIVE 10/26/2016 0600   KETONESUR NEGATIVE 10/26/2016 0600   PROTEINUR NEGATIVE 10/26/2016 0600   UROBILINOGEN 0.2 04/16/2014 0347   NITRITE POSITIVE (A) 10/26/2016 0600   LEUKOCYTESUR NEGATIVE 10/26/2016 0600   Sepsis Labs Invalid input(s): PROCALCITONIN,  WBC,  LACTICIDVEN Microbiology Recent Results (from the past 240 hour(s))  Urine culture     Status: Abnormal   Collection Time: 10/26/16  6:00 AM  Result Value Ref Range Status   Specimen Description URINE, CATHETERIZED   Final   Special Requests NONE  Final   Culture >=100,000 COLONIES/mL KLEBSIELLA PNEUMONIAE (A)  Final   Report Status 10/28/2016 FINAL  Final   Organism ID, Bacteria KLEBSIELLA PNEUMONIAE (A)  Final      Susceptibility   Klebsiella pneumoniae - MIC*    AMPICILLIN >=32 RESISTANT Resistant     CEFAZOLIN <=4 SENSITIVE Sensitive  CEFTRIAXONE <=1 SENSITIVE Sensitive     CIPROFLOXACIN <=0.25 SENSITIVE Sensitive     GENTAMICIN <=1 SENSITIVE Sensitive     IMIPENEM <=0.25 SENSITIVE Sensitive     NITROFURANTOIN 64 INTERMEDIATE Intermediate     TRIMETH/SULFA <=20 SENSITIVE Sensitive     AMPICILLIN/SULBACTAM 4 SENSITIVE Sensitive     PIP/TAZO <=4 SENSITIVE Sensitive     Extended ESBL NEGATIVE Sensitive     * >=100,000 COLONIES/mL KLEBSIELLA PNEUMONIAE    Time coordinating discharge: Over 30 minutes  SIGNED:  Onnie Boer, MD  [facility] 10/28/2016, 12:04 PM Pager [phone].  If 7PM-7AM, please contact night-coverage [email or web address] Password [email or web address]

## 2016-10-29 DIAGNOSIS — W19XXXD Unspecified fall, subsequent encounter: Secondary | ICD-10-CM | POA: Diagnosis not present

## 2016-10-29 DIAGNOSIS — Z9181 History of falling: Secondary | ICD-10-CM | POA: Diagnosis not present

## 2016-10-29 DIAGNOSIS — Z7722 Contact with and (suspected) exposure to environmental tobacco smoke (acute) (chronic): Secondary | ICD-10-CM | POA: Diagnosis not present

## 2016-10-29 DIAGNOSIS — N390 Urinary tract infection, site not specified: Secondary | ICD-10-CM | POA: Diagnosis not present

## 2016-10-29 DIAGNOSIS — G894 Chronic pain syndrome: Secondary | ICD-10-CM | POA: Diagnosis not present

## 2016-10-29 DIAGNOSIS — E785 Hyperlipidemia, unspecified: Secondary | ICD-10-CM | POA: Diagnosis not present

## 2016-10-29 DIAGNOSIS — E669 Obesity, unspecified: Secondary | ICD-10-CM | POA: Diagnosis not present

## 2016-10-29 DIAGNOSIS — E860 Dehydration: Secondary | ICD-10-CM | POA: Diagnosis not present

## 2016-10-29 DIAGNOSIS — Z6837 Body mass index (BMI) 37.0-37.9, adult: Secondary | ICD-10-CM | POA: Diagnosis not present

## 2016-10-29 DIAGNOSIS — I951 Orthostatic hypotension: Secondary | ICD-10-CM | POA: Diagnosis not present

## 2016-10-31 DIAGNOSIS — Z9181 History of falling: Secondary | ICD-10-CM | POA: Diagnosis not present

## 2016-10-31 DIAGNOSIS — E785 Hyperlipidemia, unspecified: Secondary | ICD-10-CM | POA: Diagnosis not present

## 2016-10-31 DIAGNOSIS — I951 Orthostatic hypotension: Secondary | ICD-10-CM | POA: Diagnosis not present

## 2016-10-31 DIAGNOSIS — W19XXXD Unspecified fall, subsequent encounter: Secondary | ICD-10-CM | POA: Diagnosis not present

## 2016-10-31 DIAGNOSIS — E860 Dehydration: Secondary | ICD-10-CM | POA: Diagnosis not present

## 2016-10-31 DIAGNOSIS — N390 Urinary tract infection, site not specified: Secondary | ICD-10-CM | POA: Diagnosis not present

## 2016-11-04 DIAGNOSIS — E785 Hyperlipidemia, unspecified: Secondary | ICD-10-CM | POA: Diagnosis not present

## 2016-11-04 DIAGNOSIS — E860 Dehydration: Secondary | ICD-10-CM | POA: Diagnosis not present

## 2016-11-04 DIAGNOSIS — N390 Urinary tract infection, site not specified: Secondary | ICD-10-CM | POA: Diagnosis not present

## 2016-11-04 DIAGNOSIS — Z9181 History of falling: Secondary | ICD-10-CM | POA: Diagnosis not present

## 2016-11-04 DIAGNOSIS — W19XXXD Unspecified fall, subsequent encounter: Secondary | ICD-10-CM | POA: Diagnosis not present

## 2016-11-04 DIAGNOSIS — I951 Orthostatic hypotension: Secondary | ICD-10-CM | POA: Diagnosis not present

## 2016-11-07 DIAGNOSIS — E860 Dehydration: Secondary | ICD-10-CM | POA: Diagnosis not present

## 2016-11-07 DIAGNOSIS — E785 Hyperlipidemia, unspecified: Secondary | ICD-10-CM | POA: Diagnosis not present

## 2016-11-07 DIAGNOSIS — Z9181 History of falling: Secondary | ICD-10-CM | POA: Diagnosis not present

## 2016-11-07 DIAGNOSIS — W19XXXD Unspecified fall, subsequent encounter: Secondary | ICD-10-CM | POA: Diagnosis not present

## 2016-11-07 DIAGNOSIS — I951 Orthostatic hypotension: Secondary | ICD-10-CM | POA: Diagnosis not present

## 2016-11-07 DIAGNOSIS — N390 Urinary tract infection, site not specified: Secondary | ICD-10-CM | POA: Diagnosis not present

## 2016-11-11 DIAGNOSIS — W19XXXD Unspecified fall, subsequent encounter: Secondary | ICD-10-CM | POA: Diagnosis not present

## 2016-11-11 DIAGNOSIS — N390 Urinary tract infection, site not specified: Secondary | ICD-10-CM | POA: Diagnosis not present

## 2016-11-11 DIAGNOSIS — E785 Hyperlipidemia, unspecified: Secondary | ICD-10-CM | POA: Diagnosis not present

## 2016-11-11 DIAGNOSIS — E860 Dehydration: Secondary | ICD-10-CM | POA: Diagnosis not present

## 2016-11-11 DIAGNOSIS — I951 Orthostatic hypotension: Secondary | ICD-10-CM | POA: Diagnosis not present

## 2016-11-11 DIAGNOSIS — Z9181 History of falling: Secondary | ICD-10-CM | POA: Diagnosis not present

## 2016-11-13 DIAGNOSIS — Z9181 History of falling: Secondary | ICD-10-CM | POA: Diagnosis not present

## 2016-11-13 DIAGNOSIS — I951 Orthostatic hypotension: Secondary | ICD-10-CM | POA: Diagnosis not present

## 2016-11-13 DIAGNOSIS — N390 Urinary tract infection, site not specified: Secondary | ICD-10-CM | POA: Diagnosis not present

## 2016-11-13 DIAGNOSIS — E785 Hyperlipidemia, unspecified: Secondary | ICD-10-CM | POA: Diagnosis not present

## 2016-11-13 DIAGNOSIS — W19XXXD Unspecified fall, subsequent encounter: Secondary | ICD-10-CM | POA: Diagnosis not present

## 2016-11-13 DIAGNOSIS — E860 Dehydration: Secondary | ICD-10-CM | POA: Diagnosis not present

## 2016-12-04 DIAGNOSIS — G894 Chronic pain syndrome: Secondary | ICD-10-CM | POA: Diagnosis not present

## 2016-12-04 DIAGNOSIS — Z6837 Body mass index (BMI) 37.0-37.9, adult: Secondary | ICD-10-CM | POA: Diagnosis not present

## 2016-12-04 DIAGNOSIS — M2550 Pain in unspecified joint: Secondary | ICD-10-CM | POA: Diagnosis not present

## 2016-12-04 DIAGNOSIS — F419 Anxiety disorder, unspecified: Secondary | ICD-10-CM | POA: Diagnosis not present

## 2016-12-04 DIAGNOSIS — M1991 Primary osteoarthritis, unspecified site: Secondary | ICD-10-CM | POA: Diagnosis not present

## 2016-12-04 DIAGNOSIS — E063 Autoimmune thyroiditis: Secondary | ICD-10-CM | POA: Diagnosis not present

## 2016-12-04 DIAGNOSIS — E669 Obesity, unspecified: Secondary | ICD-10-CM | POA: Diagnosis not present

## 2016-12-04 DIAGNOSIS — T50905S Adverse effect of unspecified drugs, medicaments and biological substances, sequela: Secondary | ICD-10-CM | POA: Diagnosis not present

## 2016-12-04 DIAGNOSIS — N390 Urinary tract infection, site not specified: Secondary | ICD-10-CM | POA: Diagnosis not present

## 2016-12-04 DIAGNOSIS — E1129 Type 2 diabetes mellitus with other diabetic kidney complication: Secondary | ICD-10-CM | POA: Diagnosis not present

## 2016-12-04 DIAGNOSIS — R300 Dysuria: Secondary | ICD-10-CM | POA: Diagnosis not present

## 2016-12-05 DIAGNOSIS — E1129 Type 2 diabetes mellitus with other diabetic kidney complication: Secondary | ICD-10-CM | POA: Diagnosis not present

## 2016-12-05 DIAGNOSIS — N390 Urinary tract infection, site not specified: Secondary | ICD-10-CM | POA: Diagnosis not present

## 2016-12-05 DIAGNOSIS — E063 Autoimmune thyroiditis: Secondary | ICD-10-CM | POA: Diagnosis not present

## 2016-12-05 DIAGNOSIS — M1991 Primary osteoarthritis, unspecified site: Secondary | ICD-10-CM | POA: Diagnosis not present

## 2016-12-05 DIAGNOSIS — F419 Anxiety disorder, unspecified: Secondary | ICD-10-CM | POA: Diagnosis not present

## 2016-12-05 DIAGNOSIS — Z6837 Body mass index (BMI) 37.0-37.9, adult: Secondary | ICD-10-CM | POA: Diagnosis not present

## 2016-12-05 DIAGNOSIS — G894 Chronic pain syndrome: Secondary | ICD-10-CM | POA: Diagnosis not present

## 2016-12-05 DIAGNOSIS — E669 Obesity, unspecified: Secondary | ICD-10-CM | POA: Diagnosis not present

## 2016-12-05 DIAGNOSIS — R300 Dysuria: Secondary | ICD-10-CM | POA: Diagnosis not present

## 2016-12-05 DIAGNOSIS — M2550 Pain in unspecified joint: Secondary | ICD-10-CM | POA: Diagnosis not present

## 2016-12-05 DIAGNOSIS — T50905S Adverse effect of unspecified drugs, medicaments and biological substances, sequela: Secondary | ICD-10-CM | POA: Diagnosis not present

## 2016-12-30 DIAGNOSIS — H401131 Primary open-angle glaucoma, bilateral, mild stage: Secondary | ICD-10-CM | POA: Diagnosis not present

## 2017-02-11 DIAGNOSIS — Z6838 Body mass index (BMI) 38.0-38.9, adult: Secondary | ICD-10-CM | POA: Diagnosis not present

## 2017-02-11 DIAGNOSIS — R350 Frequency of micturition: Secondary | ICD-10-CM | POA: Diagnosis not present

## 2017-02-11 DIAGNOSIS — F419 Anxiety disorder, unspecified: Secondary | ICD-10-CM | POA: Diagnosis not present

## 2017-02-11 DIAGNOSIS — N3289 Other specified disorders of bladder: Secondary | ICD-10-CM | POA: Diagnosis not present

## 2017-03-24 DIAGNOSIS — I10 Essential (primary) hypertension: Secondary | ICD-10-CM | POA: Diagnosis not present

## 2017-03-24 DIAGNOSIS — F419 Anxiety disorder, unspecified: Secondary | ICD-10-CM | POA: Diagnosis not present

## 2017-03-24 DIAGNOSIS — Z0000 Encounter for general adult medical examination without abnormal findings: Secondary | ICD-10-CM | POA: Diagnosis not present

## 2017-03-24 DIAGNOSIS — R7309 Other abnormal glucose: Secondary | ICD-10-CM | POA: Diagnosis not present

## 2017-03-24 DIAGNOSIS — Z6837 Body mass index (BMI) 37.0-37.9, adult: Secondary | ICD-10-CM | POA: Diagnosis not present

## 2017-03-24 DIAGNOSIS — Z1389 Encounter for screening for other disorder: Secondary | ICD-10-CM | POA: Diagnosis not present

## 2017-03-24 DIAGNOSIS — M1991 Primary osteoarthritis, unspecified site: Secondary | ICD-10-CM | POA: Diagnosis not present

## 2017-03-24 DIAGNOSIS — G894 Chronic pain syndrome: Secondary | ICD-10-CM | POA: Diagnosis not present

## 2017-04-21 DIAGNOSIS — E1140 Type 2 diabetes mellitus with diabetic neuropathy, unspecified: Secondary | ICD-10-CM | POA: Diagnosis not present

## 2017-04-21 DIAGNOSIS — B351 Tinea unguium: Secondary | ICD-10-CM | POA: Diagnosis not present

## 2017-04-21 DIAGNOSIS — L600 Ingrowing nail: Secondary | ICD-10-CM | POA: Diagnosis not present

## 2017-04-21 DIAGNOSIS — E1151 Type 2 diabetes mellitus with diabetic peripheral angiopathy without gangrene: Secondary | ICD-10-CM | POA: Diagnosis not present

## 2017-04-28 DIAGNOSIS — H401131 Primary open-angle glaucoma, bilateral, mild stage: Secondary | ICD-10-CM | POA: Diagnosis not present

## 2017-07-01 DIAGNOSIS — M1991 Primary osteoarthritis, unspecified site: Secondary | ICD-10-CM | POA: Diagnosis not present

## 2017-07-01 DIAGNOSIS — Z6839 Body mass index (BMI) 39.0-39.9, adult: Secondary | ICD-10-CM | POA: Diagnosis not present

## 2017-07-01 DIAGNOSIS — R7309 Other abnormal glucose: Secondary | ICD-10-CM | POA: Diagnosis not present

## 2017-07-01 DIAGNOSIS — E782 Mixed hyperlipidemia: Secondary | ICD-10-CM | POA: Diagnosis not present

## 2017-07-01 DIAGNOSIS — G894 Chronic pain syndrome: Secondary | ICD-10-CM | POA: Diagnosis not present

## 2017-07-01 DIAGNOSIS — Z23 Encounter for immunization: Secondary | ICD-10-CM | POA: Diagnosis not present

## 2017-07-07 DIAGNOSIS — L600 Ingrowing nail: Secondary | ICD-10-CM | POA: Diagnosis not present

## 2017-07-07 DIAGNOSIS — E1151 Type 2 diabetes mellitus with diabetic peripheral angiopathy without gangrene: Secondary | ICD-10-CM | POA: Diagnosis not present

## 2017-07-07 DIAGNOSIS — E1140 Type 2 diabetes mellitus with diabetic neuropathy, unspecified: Secondary | ICD-10-CM | POA: Diagnosis not present

## 2017-07-07 DIAGNOSIS — B351 Tinea unguium: Secondary | ICD-10-CM | POA: Diagnosis not present

## 2017-07-07 DIAGNOSIS — Z23 Encounter for immunization: Secondary | ICD-10-CM | POA: Diagnosis not present

## 2017-07-07 DIAGNOSIS — Z1389 Encounter for screening for other disorder: Secondary | ICD-10-CM | POA: Diagnosis not present

## 2017-11-06 DIAGNOSIS — Z1389 Encounter for screening for other disorder: Secondary | ICD-10-CM | POA: Diagnosis not present

## 2017-11-06 DIAGNOSIS — M1990 Unspecified osteoarthritis, unspecified site: Secondary | ICD-10-CM | POA: Diagnosis not present

## 2017-11-06 DIAGNOSIS — E1129 Type 2 diabetes mellitus with other diabetic kidney complication: Secondary | ICD-10-CM | POA: Diagnosis not present

## 2017-11-06 DIAGNOSIS — Z6838 Body mass index (BMI) 38.0-38.9, adult: Secondary | ICD-10-CM | POA: Diagnosis not present

## 2017-11-06 DIAGNOSIS — R201 Hypoesthesia of skin: Secondary | ICD-10-CM | POA: Diagnosis not present

## 2017-11-06 DIAGNOSIS — M1991 Primary osteoarthritis, unspecified site: Secondary | ICD-10-CM | POA: Diagnosis not present

## 2017-11-06 DIAGNOSIS — I10 Essential (primary) hypertension: Secondary | ICD-10-CM | POA: Diagnosis not present

## 2017-11-06 DIAGNOSIS — N182 Chronic kidney disease, stage 2 (mild): Secondary | ICD-10-CM | POA: Diagnosis not present

## 2017-11-06 DIAGNOSIS — L84 Corns and callosities: Secondary | ICD-10-CM | POA: Diagnosis not present

## 2018-02-02 DIAGNOSIS — E119 Type 2 diabetes mellitus without complications: Secondary | ICD-10-CM | POA: Diagnosis not present

## 2018-02-02 DIAGNOSIS — Z6841 Body Mass Index (BMI) 40.0 and over, adult: Secondary | ICD-10-CM | POA: Diagnosis not present

## 2018-02-02 DIAGNOSIS — M2550 Pain in unspecified joint: Secondary | ICD-10-CM | POA: Diagnosis not present

## 2018-02-02 DIAGNOSIS — Z1389 Encounter for screening for other disorder: Secondary | ICD-10-CM | POA: Diagnosis not present

## 2018-02-02 DIAGNOSIS — G894 Chronic pain syndrome: Secondary | ICD-10-CM | POA: Diagnosis not present

## 2018-02-02 DIAGNOSIS — F419 Anxiety disorder, unspecified: Secondary | ICD-10-CM | POA: Diagnosis not present

## 2018-02-02 DIAGNOSIS — M1991 Primary osteoarthritis, unspecified site: Secondary | ICD-10-CM | POA: Diagnosis not present

## 2018-02-02 DIAGNOSIS — N182 Chronic kidney disease, stage 2 (mild): Secondary | ICD-10-CM | POA: Diagnosis not present

## 2018-02-02 DIAGNOSIS — E782 Mixed hyperlipidemia: Secondary | ICD-10-CM | POA: Diagnosis not present

## 2018-02-02 DIAGNOSIS — E1129 Type 2 diabetes mellitus with other diabetic kidney complication: Secondary | ICD-10-CM | POA: Diagnosis not present

## 2018-02-02 DIAGNOSIS — E669 Obesity, unspecified: Secondary | ICD-10-CM | POA: Diagnosis not present

## 2018-06-23 DIAGNOSIS — N182 Chronic kidney disease, stage 2 (mild): Secondary | ICD-10-CM | POA: Diagnosis not present

## 2018-06-23 DIAGNOSIS — Z23 Encounter for immunization: Secondary | ICD-10-CM | POA: Diagnosis not present

## 2018-06-23 DIAGNOSIS — R946 Abnormal results of thyroid function studies: Secondary | ICD-10-CM | POA: Diagnosis not present

## 2018-06-23 DIAGNOSIS — Z1389 Encounter for screening for other disorder: Secondary | ICD-10-CM | POA: Diagnosis not present

## 2018-06-23 DIAGNOSIS — E119 Type 2 diabetes mellitus without complications: Secondary | ICD-10-CM | POA: Diagnosis not present

## 2018-06-23 DIAGNOSIS — Z6841 Body Mass Index (BMI) 40.0 and over, adult: Secondary | ICD-10-CM | POA: Diagnosis not present

## 2018-06-23 DIAGNOSIS — I10 Essential (primary) hypertension: Secondary | ICD-10-CM | POA: Diagnosis not present

## 2018-06-23 DIAGNOSIS — Z0001 Encounter for general adult medical examination with abnormal findings: Secondary | ICD-10-CM | POA: Diagnosis not present

## 2018-06-23 DIAGNOSIS — E782 Mixed hyperlipidemia: Secondary | ICD-10-CM | POA: Diagnosis not present

## 2018-09-22 DIAGNOSIS — E1129 Type 2 diabetes mellitus with other diabetic kidney complication: Secondary | ICD-10-CM | POA: Diagnosis not present

## 2018-09-22 DIAGNOSIS — Z6836 Body mass index (BMI) 36.0-36.9, adult: Secondary | ICD-10-CM | POA: Diagnosis not present

## 2018-09-22 DIAGNOSIS — M1991 Primary osteoarthritis, unspecified site: Secondary | ICD-10-CM | POA: Diagnosis not present

## 2018-09-22 DIAGNOSIS — E063 Autoimmune thyroiditis: Secondary | ICD-10-CM | POA: Diagnosis not present

## 2018-09-22 DIAGNOSIS — N182 Chronic kidney disease, stage 2 (mild): Secondary | ICD-10-CM | POA: Diagnosis not present

## 2018-11-04 DIAGNOSIS — I10 Essential (primary) hypertension: Secondary | ICD-10-CM | POA: Diagnosis not present

## 2018-11-04 DIAGNOSIS — G894 Chronic pain syndrome: Secondary | ICD-10-CM | POA: Diagnosis not present

## 2018-11-04 DIAGNOSIS — E6609 Other obesity due to excess calories: Secondary | ICD-10-CM | POA: Diagnosis not present

## 2018-11-04 DIAGNOSIS — Z6835 Body mass index (BMI) 35.0-35.9, adult: Secondary | ICD-10-CM | POA: Diagnosis not present

## 2018-11-24 ENCOUNTER — Other Ambulatory Visit (HOSPITAL_COMMUNITY): Payer: Self-pay | Admitting: Internal Medicine

## 2018-11-24 DIAGNOSIS — E063 Autoimmune thyroiditis: Secondary | ICD-10-CM | POA: Diagnosis not present

## 2018-11-24 DIAGNOSIS — F329 Major depressive disorder, single episode, unspecified: Secondary | ICD-10-CM | POA: Diagnosis not present

## 2018-11-24 DIAGNOSIS — R0609 Other forms of dyspnea: Secondary | ICD-10-CM | POA: Diagnosis not present

## 2018-11-24 DIAGNOSIS — Z6839 Body mass index (BMI) 39.0-39.9, adult: Secondary | ICD-10-CM | POA: Diagnosis not present

## 2018-11-24 DIAGNOSIS — G894 Chronic pain syndrome: Secondary | ICD-10-CM | POA: Diagnosis not present

## 2018-11-24 DIAGNOSIS — Z1389 Encounter for screening for other disorder: Secondary | ICD-10-CM | POA: Diagnosis not present

## 2018-11-24 DIAGNOSIS — R0600 Dyspnea, unspecified: Secondary | ICD-10-CM

## 2018-11-24 DIAGNOSIS — I10 Essential (primary) hypertension: Secondary | ICD-10-CM | POA: Diagnosis not present

## 2018-12-24 DIAGNOSIS — G894 Chronic pain syndrome: Secondary | ICD-10-CM | POA: Diagnosis not present

## 2019-01-07 ENCOUNTER — Encounter: Payer: Self-pay | Admitting: Cardiovascular Disease

## 2019-02-18 DIAGNOSIS — G894 Chronic pain syndrome: Secondary | ICD-10-CM | POA: Diagnosis not present

## 2019-03-18 DIAGNOSIS — G894 Chronic pain syndrome: Secondary | ICD-10-CM | POA: Diagnosis not present

## 2019-04-14 DIAGNOSIS — I10 Essential (primary) hypertension: Secondary | ICD-10-CM | POA: Diagnosis not present

## 2019-04-14 DIAGNOSIS — M179 Osteoarthritis of knee, unspecified: Secondary | ICD-10-CM | POA: Diagnosis not present

## 2019-04-14 DIAGNOSIS — G894 Chronic pain syndrome: Secondary | ICD-10-CM | POA: Diagnosis not present

## 2019-05-19 DIAGNOSIS — I10 Essential (primary) hypertension: Secondary | ICD-10-CM | POA: Diagnosis not present

## 2019-06-17 DIAGNOSIS — I10 Essential (primary) hypertension: Secondary | ICD-10-CM | POA: Diagnosis not present

## 2019-06-17 DIAGNOSIS — G894 Chronic pain syndrome: Secondary | ICD-10-CM | POA: Diagnosis not present

## 2019-07-14 DIAGNOSIS — G894 Chronic pain syndrome: Secondary | ICD-10-CM | POA: Diagnosis not present

## 2019-08-18 DIAGNOSIS — G894 Chronic pain syndrome: Secondary | ICD-10-CM | POA: Diagnosis not present

## 2019-09-22 DIAGNOSIS — G894 Chronic pain syndrome: Secondary | ICD-10-CM | POA: Diagnosis not present

## 2019-10-20 DIAGNOSIS — G894 Chronic pain syndrome: Secondary | ICD-10-CM | POA: Diagnosis not present

## 2019-10-20 DIAGNOSIS — M720 Palmar fascial fibromatosis [Dupuytren]: Secondary | ICD-10-CM | POA: Diagnosis not present

## 2019-12-13 DIAGNOSIS — G894 Chronic pain syndrome: Secondary | ICD-10-CM | POA: Diagnosis not present

## 2020-04-13 DIAGNOSIS — E1129 Type 2 diabetes mellitus with other diabetic kidney complication: Secondary | ICD-10-CM | POA: Diagnosis not present

## 2020-04-26 ENCOUNTER — Other Ambulatory Visit (HOSPITAL_COMMUNITY): Payer: Self-pay | Admitting: Family Medicine

## 2020-04-26 DIAGNOSIS — I10 Essential (primary) hypertension: Secondary | ICD-10-CM | POA: Diagnosis not present

## 2020-04-26 DIAGNOSIS — R0609 Other forms of dyspnea: Secondary | ICD-10-CM | POA: Diagnosis not present

## 2020-04-26 DIAGNOSIS — G894 Chronic pain syndrome: Secondary | ICD-10-CM | POA: Diagnosis not present

## 2020-04-26 DIAGNOSIS — Z6836 Body mass index (BMI) 36.0-36.9, adult: Secondary | ICD-10-CM | POA: Diagnosis not present

## 2020-05-15 DIAGNOSIS — G894 Chronic pain syndrome: Secondary | ICD-10-CM | POA: Diagnosis not present

## 2020-05-15 DIAGNOSIS — E7849 Other hyperlipidemia: Secondary | ICD-10-CM | POA: Diagnosis not present

## 2020-05-15 DIAGNOSIS — Z23 Encounter for immunization: Secondary | ICD-10-CM | POA: Diagnosis not present

## 2020-05-15 DIAGNOSIS — M1991 Primary osteoarthritis, unspecified site: Secondary | ICD-10-CM | POA: Diagnosis not present

## 2020-05-24 DIAGNOSIS — I4891 Unspecified atrial fibrillation: Secondary | ICD-10-CM | POA: Diagnosis not present

## 2020-05-24 DIAGNOSIS — G894 Chronic pain syndrome: Secondary | ICD-10-CM | POA: Diagnosis not present

## 2020-05-24 DIAGNOSIS — I509 Heart failure, unspecified: Secondary | ICD-10-CM | POA: Diagnosis not present

## 2020-05-24 DIAGNOSIS — F419 Anxiety disorder, unspecified: Secondary | ICD-10-CM | POA: Diagnosis not present

## 2020-05-28 DIAGNOSIS — T1490XA Injury, unspecified, initial encounter: Secondary | ICD-10-CM | POA: Diagnosis not present

## 2020-05-28 DIAGNOSIS — W19XXXA Unspecified fall, initial encounter: Secondary | ICD-10-CM | POA: Diagnosis not present

## 2020-06-17 DIAGNOSIS — Z23 Encounter for immunization: Secondary | ICD-10-CM | POA: Diagnosis not present

## 2020-06-22 DIAGNOSIS — N390 Urinary tract infection, site not specified: Secondary | ICD-10-CM | POA: Diagnosis not present

## 2020-06-22 DIAGNOSIS — M6282 Rhabdomyolysis: Secondary | ICD-10-CM | POA: Diagnosis not present

## 2020-06-22 DIAGNOSIS — I482 Chronic atrial fibrillation, unspecified: Secondary | ICD-10-CM | POA: Diagnosis not present

## 2020-06-22 DIAGNOSIS — I110 Hypertensive heart disease with heart failure: Secondary | ICD-10-CM | POA: Diagnosis not present

## 2020-06-22 DIAGNOSIS — Z20822 Contact with and (suspected) exposure to covid-19: Secondary | ICD-10-CM | POA: Diagnosis not present

## 2020-06-22 DIAGNOSIS — M25561 Pain in right knee: Secondary | ICD-10-CM | POA: Diagnosis not present

## 2020-06-22 DIAGNOSIS — I517 Cardiomegaly: Secondary | ICD-10-CM | POA: Diagnosis not present

## 2020-06-22 DIAGNOSIS — N179 Acute kidney failure, unspecified: Secondary | ICD-10-CM | POA: Diagnosis not present

## 2020-06-22 DIAGNOSIS — I5033 Acute on chronic diastolic (congestive) heart failure: Secondary | ICD-10-CM | POA: Diagnosis not present

## 2020-06-22 DIAGNOSIS — J811 Chronic pulmonary edema: Secondary | ICD-10-CM | POA: Diagnosis not present

## 2020-06-22 DIAGNOSIS — R52 Pain, unspecified: Secondary | ICD-10-CM | POA: Diagnosis not present

## 2020-06-22 DIAGNOSIS — R0902 Hypoxemia: Secondary | ICD-10-CM | POA: Diagnosis not present

## 2020-06-22 DIAGNOSIS — I272 Pulmonary hypertension, unspecified: Secondary | ICD-10-CM | POA: Diagnosis not present

## 2020-06-22 DIAGNOSIS — W19XXXA Unspecified fall, initial encounter: Secondary | ICD-10-CM | POA: Diagnosis not present

## 2020-06-22 DIAGNOSIS — J90 Pleural effusion, not elsewhere classified: Secondary | ICD-10-CM | POA: Diagnosis not present

## 2020-06-23 DIAGNOSIS — R748 Abnormal levels of other serum enzymes: Secondary | ICD-10-CM | POA: Diagnosis not present

## 2020-06-23 DIAGNOSIS — W19XXXA Unspecified fall, initial encounter: Secondary | ICD-10-CM | POA: Diagnosis not present

## 2020-06-23 DIAGNOSIS — M6282 Rhabdomyolysis: Secondary | ICD-10-CM | POA: Diagnosis not present

## 2020-06-24 DIAGNOSIS — I517 Cardiomegaly: Secondary | ICD-10-CM | POA: Diagnosis not present

## 2020-06-24 DIAGNOSIS — R296 Repeated falls: Secondary | ICD-10-CM | POA: Diagnosis not present

## 2020-06-24 DIAGNOSIS — J90 Pleural effusion, not elsewhere classified: Secondary | ICD-10-CM | POA: Diagnosis not present

## 2020-06-24 DIAGNOSIS — R0902 Hypoxemia: Secondary | ICD-10-CM | POA: Diagnosis not present

## 2020-06-24 DIAGNOSIS — R2689 Other abnormalities of gait and mobility: Secondary | ICD-10-CM | POA: Diagnosis not present

## 2020-06-24 DIAGNOSIS — F0390 Unspecified dementia without behavioral disturbance: Secondary | ICD-10-CM | POA: Diagnosis not present

## 2020-06-24 DIAGNOSIS — Z9181 History of falling: Secondary | ICD-10-CM | POA: Diagnosis not present

## 2020-06-24 DIAGNOSIS — B961 Klebsiella pneumoniae [K. pneumoniae] as the cause of diseases classified elsewhere: Secondary | ICD-10-CM | POA: Diagnosis present

## 2020-06-24 DIAGNOSIS — I110 Hypertensive heart disease with heart failure: Secondary | ICD-10-CM | POA: Diagnosis present

## 2020-06-24 DIAGNOSIS — M6281 Muscle weakness (generalized): Secondary | ICD-10-CM | POA: Diagnosis not present

## 2020-06-24 DIAGNOSIS — E876 Hypokalemia: Secondary | ICD-10-CM | POA: Diagnosis not present

## 2020-06-24 DIAGNOSIS — F339 Major depressive disorder, recurrent, unspecified: Secondary | ICD-10-CM | POA: Diagnosis not present

## 2020-06-24 DIAGNOSIS — R748 Abnormal levels of other serum enzymes: Secondary | ICD-10-CM | POA: Diagnosis not present

## 2020-06-24 DIAGNOSIS — M170 Bilateral primary osteoarthritis of knee: Secondary | ICD-10-CM | POA: Diagnosis not present

## 2020-06-24 DIAGNOSIS — I272 Pulmonary hypertension, unspecified: Secondary | ICD-10-CM | POA: Diagnosis not present

## 2020-06-24 DIAGNOSIS — I4891 Unspecified atrial fibrillation: Secondary | ICD-10-CM | POA: Diagnosis not present

## 2020-06-24 DIAGNOSIS — R4182 Altered mental status, unspecified: Secondary | ICD-10-CM | POA: Diagnosis not present

## 2020-06-24 DIAGNOSIS — I361 Nonrheumatic tricuspid (valve) insufficiency: Secondary | ICD-10-CM | POA: Diagnosis not present

## 2020-06-24 DIAGNOSIS — E7800 Pure hypercholesterolemia, unspecified: Secondary | ICD-10-CM | POA: Diagnosis present

## 2020-06-24 DIAGNOSIS — I5033 Acute on chronic diastolic (congestive) heart failure: Secondary | ICD-10-CM | POA: Diagnosis present

## 2020-06-24 DIAGNOSIS — R41841 Cognitive communication deficit: Secondary | ICD-10-CM | POA: Diagnosis not present

## 2020-06-24 DIAGNOSIS — Z20822 Contact with and (suspected) exposure to covid-19: Secondary | ICD-10-CM | POA: Diagnosis present

## 2020-06-24 DIAGNOSIS — E039 Hypothyroidism, unspecified: Secondary | ICD-10-CM | POA: Diagnosis not present

## 2020-06-24 DIAGNOSIS — G4733 Obstructive sleep apnea (adult) (pediatric): Secondary | ICD-10-CM | POA: Diagnosis present

## 2020-06-24 DIAGNOSIS — M6282 Rhabdomyolysis: Secondary | ICD-10-CM | POA: Diagnosis not present

## 2020-06-24 DIAGNOSIS — G629 Polyneuropathy, unspecified: Secondary | ICD-10-CM | POA: Diagnosis not present

## 2020-06-24 DIAGNOSIS — I5032 Chronic diastolic (congestive) heart failure: Secondary | ICD-10-CM | POA: Diagnosis not present

## 2020-06-24 DIAGNOSIS — N179 Acute kidney failure, unspecified: Secondary | ICD-10-CM | POA: Diagnosis not present

## 2020-06-24 DIAGNOSIS — F419 Anxiety disorder, unspecified: Secondary | ICD-10-CM | POA: Diagnosis present

## 2020-06-24 DIAGNOSIS — J189 Pneumonia, unspecified organism: Secondary | ICD-10-CM | POA: Diagnosis not present

## 2020-06-24 DIAGNOSIS — Z7982 Long term (current) use of aspirin: Secondary | ICD-10-CM | POA: Diagnosis not present

## 2020-06-24 DIAGNOSIS — Z96651 Presence of right artificial knee joint: Secondary | ICD-10-CM | POA: Diagnosis present

## 2020-06-24 DIAGNOSIS — N390 Urinary tract infection, site not specified: Secondary | ICD-10-CM | POA: Diagnosis not present

## 2020-06-24 DIAGNOSIS — Z79890 Hormone replacement therapy (postmenopausal): Secondary | ICD-10-CM | POA: Diagnosis not present

## 2020-06-24 DIAGNOSIS — W19XXXA Unspecified fall, initial encounter: Secondary | ICD-10-CM | POA: Diagnosis not present

## 2020-06-24 DIAGNOSIS — R0602 Shortness of breath: Secondary | ICD-10-CM | POA: Diagnosis not present

## 2020-06-28 DIAGNOSIS — J90 Pleural effusion, not elsewhere classified: Secondary | ICD-10-CM | POA: Diagnosis not present

## 2020-06-28 DIAGNOSIS — I517 Cardiomegaly: Secondary | ICD-10-CM | POA: Diagnosis not present

## 2020-06-28 DIAGNOSIS — F330 Major depressive disorder, recurrent, mild: Secondary | ICD-10-CM | POA: Diagnosis not present

## 2020-06-28 DIAGNOSIS — I272 Pulmonary hypertension, unspecified: Secondary | ICD-10-CM | POA: Diagnosis present

## 2020-06-28 DIAGNOSIS — F0390 Unspecified dementia without behavioral disturbance: Secondary | ICD-10-CM | POA: Diagnosis present

## 2020-06-28 DIAGNOSIS — Z20822 Contact with and (suspected) exposure to covid-19: Secondary | ICD-10-CM | POA: Diagnosis not present

## 2020-06-28 DIAGNOSIS — R5381 Other malaise: Secondary | ICD-10-CM | POA: Diagnosis present

## 2020-06-28 DIAGNOSIS — M1712 Unilateral primary osteoarthritis, left knee: Secondary | ICD-10-CM | POA: Diagnosis not present

## 2020-06-28 DIAGNOSIS — I5033 Acute on chronic diastolic (congestive) heart failure: Secondary | ICD-10-CM | POA: Diagnosis not present

## 2020-06-28 DIAGNOSIS — E876 Hypokalemia: Secondary | ICD-10-CM | POA: Diagnosis not present

## 2020-06-28 DIAGNOSIS — N319 Neuromuscular dysfunction of bladder, unspecified: Secondary | ICD-10-CM | POA: Diagnosis not present

## 2020-06-28 DIAGNOSIS — J9621 Acute and chronic respiratory failure with hypoxia: Secondary | ICD-10-CM | POA: Diagnosis present

## 2020-06-28 DIAGNOSIS — I110 Hypertensive heart disease with heart failure: Secondary | ICD-10-CM | POA: Diagnosis not present

## 2020-06-28 DIAGNOSIS — R296 Repeated falls: Secondary | ICD-10-CM | POA: Diagnosis not present

## 2020-06-28 DIAGNOSIS — M6281 Muscle weakness (generalized): Secondary | ICD-10-CM | POA: Diagnosis not present

## 2020-06-28 DIAGNOSIS — I5023 Acute on chronic systolic (congestive) heart failure: Secondary | ICD-10-CM | POA: Diagnosis present

## 2020-06-28 DIAGNOSIS — J9601 Acute respiratory failure with hypoxia: Secondary | ICD-10-CM | POA: Diagnosis not present

## 2020-06-28 DIAGNOSIS — R0602 Shortness of breath: Secondary | ICD-10-CM | POA: Diagnosis not present

## 2020-06-28 DIAGNOSIS — J811 Chronic pulmonary edema: Secondary | ICD-10-CM | POA: Diagnosis not present

## 2020-06-28 DIAGNOSIS — E039 Hypothyroidism, unspecified: Secondary | ICD-10-CM | POA: Diagnosis present

## 2020-06-28 DIAGNOSIS — Z96659 Presence of unspecified artificial knee joint: Secondary | ICD-10-CM | POA: Diagnosis present

## 2020-06-28 DIAGNOSIS — G629 Polyneuropathy, unspecified: Secondary | ICD-10-CM | POA: Diagnosis not present

## 2020-06-28 DIAGNOSIS — Z66 Do not resuscitate: Secondary | ICD-10-CM | POA: Diagnosis present

## 2020-06-28 DIAGNOSIS — M1711 Unilateral primary osteoarthritis, right knee: Secondary | ICD-10-CM | POA: Diagnosis not present

## 2020-06-28 DIAGNOSIS — F419 Anxiety disorder, unspecified: Secondary | ICD-10-CM | POA: Diagnosis present

## 2020-06-28 DIAGNOSIS — R0902 Hypoxemia: Secondary | ICD-10-CM | POA: Diagnosis not present

## 2020-06-28 DIAGNOSIS — G4733 Obstructive sleep apnea (adult) (pediatric): Secondary | ICD-10-CM | POA: Diagnosis present

## 2020-06-28 DIAGNOSIS — R0600 Dyspnea, unspecified: Secondary | ICD-10-CM | POA: Diagnosis not present

## 2020-06-28 DIAGNOSIS — W19XXXA Unspecified fall, initial encounter: Secondary | ICD-10-CM | POA: Diagnosis not present

## 2020-06-28 DIAGNOSIS — E871 Hypo-osmolality and hyponatremia: Secondary | ICD-10-CM | POA: Diagnosis present

## 2020-06-28 DIAGNOSIS — I50811 Acute right heart failure: Secondary | ICD-10-CM | POA: Diagnosis not present

## 2020-06-28 DIAGNOSIS — Z7982 Long term (current) use of aspirin: Secondary | ICD-10-CM | POA: Diagnosis not present

## 2020-06-28 DIAGNOSIS — N179 Acute kidney failure, unspecified: Secondary | ICD-10-CM | POA: Diagnosis not present

## 2020-06-28 DIAGNOSIS — L03116 Cellulitis of left lower limb: Secondary | ICD-10-CM | POA: Diagnosis not present

## 2020-06-28 DIAGNOSIS — I5032 Chronic diastolic (congestive) heart failure: Secondary | ICD-10-CM | POA: Diagnosis not present

## 2020-06-28 DIAGNOSIS — M170 Bilateral primary osteoarthritis of knee: Secondary | ICD-10-CM | POA: Diagnosis not present

## 2020-06-28 DIAGNOSIS — S37099A Other injury of unspecified kidney, initial encounter: Secondary | ICD-10-CM | POA: Diagnosis not present

## 2020-06-28 DIAGNOSIS — R4182 Altered mental status, unspecified: Secondary | ICD-10-CM | POA: Diagnosis not present

## 2020-06-28 DIAGNOSIS — J189 Pneumonia, unspecified organism: Secondary | ICD-10-CM | POA: Diagnosis not present

## 2020-06-28 DIAGNOSIS — R262 Difficulty in walking, not elsewhere classified: Secondary | ICD-10-CM | POA: Diagnosis not present

## 2020-06-28 DIAGNOSIS — M1990 Unspecified osteoarthritis, unspecified site: Secondary | ICD-10-CM | POA: Diagnosis present

## 2020-06-28 DIAGNOSIS — L03115 Cellulitis of right lower limb: Secondary | ICD-10-CM | POA: Diagnosis not present

## 2020-06-28 DIAGNOSIS — I2729 Other secondary pulmonary hypertension: Secondary | ICD-10-CM | POA: Diagnosis not present

## 2020-06-28 DIAGNOSIS — I509 Heart failure, unspecified: Secondary | ICD-10-CM | POA: Diagnosis not present

## 2020-06-28 DIAGNOSIS — Z791 Long term (current) use of non-steroidal anti-inflammatories (NSAID): Secondary | ICD-10-CM | POA: Diagnosis not present

## 2020-06-28 DIAGNOSIS — N390 Urinary tract infection, site not specified: Secondary | ICD-10-CM | POA: Diagnosis not present

## 2020-06-28 DIAGNOSIS — J9811 Atelectasis: Secondary | ICD-10-CM | POA: Diagnosis not present

## 2020-06-28 DIAGNOSIS — R41841 Cognitive communication deficit: Secondary | ICD-10-CM | POA: Diagnosis not present

## 2020-06-28 DIAGNOSIS — N3000 Acute cystitis without hematuria: Secondary | ICD-10-CM | POA: Diagnosis present

## 2020-06-28 DIAGNOSIS — Z79890 Hormone replacement therapy (postmenopausal): Secondary | ICD-10-CM | POA: Diagnosis not present

## 2020-06-28 DIAGNOSIS — I501 Left ventricular failure: Secondary | ICD-10-CM | POA: Diagnosis not present

## 2020-06-28 DIAGNOSIS — I10 Essential (primary) hypertension: Secondary | ICD-10-CM | POA: Diagnosis not present

## 2020-06-28 DIAGNOSIS — F339 Major depressive disorder, recurrent, unspecified: Secondary | ICD-10-CM | POA: Diagnosis not present

## 2020-06-28 DIAGNOSIS — R2689 Other abnormalities of gait and mobility: Secondary | ICD-10-CM | POA: Diagnosis not present

## 2020-07-01 DIAGNOSIS — M1712 Unilateral primary osteoarthritis, left knee: Secondary | ICD-10-CM | POA: Diagnosis not present

## 2020-07-01 DIAGNOSIS — E876 Hypokalemia: Secondary | ICD-10-CM | POA: Diagnosis not present

## 2020-07-01 DIAGNOSIS — S37099A Other injury of unspecified kidney, initial encounter: Secondary | ICD-10-CM | POA: Diagnosis not present

## 2020-07-01 DIAGNOSIS — F330 Major depressive disorder, recurrent, mild: Secondary | ICD-10-CM | POA: Diagnosis not present

## 2020-07-01 DIAGNOSIS — M1711 Unilateral primary osteoarthritis, right knee: Secondary | ICD-10-CM | POA: Diagnosis not present

## 2020-07-16 ENCOUNTER — Other Ambulatory Visit: Payer: Self-pay | Admitting: *Deleted

## 2020-07-16 NOTE — Patient Outreach (Signed)
[facility] Post- Acute Care Coordinator follow up. Member screened for potential [facility] Care Management needs as a benefit of [employer].  Verified in Patient Ping that member resides in [facility].   Communication sent to SNF SW to inquire about transition plans and potential [facility] Care Management needs.   Will continue to follow while member resides in SNF.    Adryanna Friedt, MSN-Ed, RN,BSN [facility] Post Acute Care Coordinator [phone] ( Business Mobile) 

## 2020-07-17 ENCOUNTER — Other Ambulatory Visit: Payer: Self-pay | Admitting: *Deleted

## 2020-07-17 NOTE — Patient Outreach (Signed)
[facility] Post-Acute Care Coordinator follow up. Member screened for potential [facility] Care Management needs as a benefit of [employer].  Update received from [facility] SW indicating member lives with son who is very supportive and attentive.   Will continue to follow for potential [facility] Care Management needs.    Raiford Noble, MSN-Ed, RN,BSN [facility] Post Acute Care Coordinator [phone] [facility]) [phone]  (Toll free office)

## 2020-08-06 ENCOUNTER — Other Ambulatory Visit: Payer: Self-pay | Admitting: *Deleted

## 2020-08-06 NOTE — Patient Outreach (Signed)
[facility] Post- Acute Care Coordinator follow up. Member screened for potential [facility] Care Management needs as a benefit of [employer].  Per Patient Ping member resides in [facility].  Communication sent to [facility] SWs to request update on anticipated transition plans and potential [facility] Care Management needs.   Kevion Fatheree, MSN, RN,BSN [facility] Post Acute Care Coordinator [phone] ( [employer]) [phone]  (Toll free office) 

## 2020-08-07 ENCOUNTER — Other Ambulatory Visit: Payer: Self-pay | Admitting: *Deleted

## 2020-08-07 NOTE — Patient Outreach (Signed)
[facility] Post- Acute Care Coordinator follow up. Member screened for potential [facility] Care Management needs as a benefit of [employer].  Update received from [facility] SW indicating transition plan remains to return home with son. Transition date not set yet.  Will continue to follow for potential [facility] Care Management needs.   Raiford Noble, MSN, RN,BSN [facility] Post Acute Care Coordinator [phone] [facility]) [phone]  (Toll free office)

## 2020-08-08 ENCOUNTER — Encounter: Payer: Self-pay | Admitting: *Deleted

## 2020-08-09 ENCOUNTER — Ambulatory Visit: Payer: Medicare Other | Admitting: Cardiology

## 2020-08-09 NOTE — Progress Notes (Deleted)
Clinical Summary Christina Frey is a 79 y.o.female   1.Chronic diastolic HF  - 06/2020 echo LVEF 60-65%, low normal RV function, severe pulm HTN PASP 65  2. Afib - from [facility] note not on anticoag due to bleeding risk with falls  3. Fall - recent admit 06/2020 to [facility] with fall - issues with rhabdo that admission  4. Pulmonary HTN - 06/2020 echo PASP was 65, this was in the setting of diastolic HF -   Past Medical History:  Diagnosis Date  . Arrhythmia   . Chest pain, unspecified    NUCLEAR STRESS TEST, 04/23/2006 - low risk scan, normal  . Chronic pain   . Claudication [facility])    LOWER ARTERIAL DUPLEX, 05/27/2006 - normal  . Diabetes mellitus without complication (HCC)   . GERD (gastroesophageal reflux disease)   . Hyperlipidemia   . Hypertension   . Murmur    2D ECHO, 08/20/2007 - EF "normal" (not noted), mild-moderate mitral annular calcification, moderate thickening of the mitral valve, aortic valve moderately sclerotic  . Obesity   . OSA (obstructive sleep apnea)    Unable to wear CPAP  . Osteoarthritis   . PUD (peptic ulcer disease)   . Thyroid disease      Allergies  Allergen Reactions  . Other     Anti-inflammatory's-causes stomach ulcers  . Zetia [Ezetimibe] Other (See Comments)    Lightheadedness      Current Outpatient Medications  Medication Sig Dispense Refill  . aspirin 81 MG tablet Take 81 mg by mouth daily.    . diazepam (VALIUM) 5 MG tablet Take 5 mg by mouth every 6 (six) hours as needed for anxiety.    . DULOXETINE HCL PO Take 20 mg by mouth in the morning and at bedtime.    . furosemide (LASIX) 20 MG tablet Take 20 mg by mouth daily.    Marland Kitchen gabapentin (NEURONTIN) 300 MG capsule Take 1 capsule by mouth 3 (three) times daily.     Marland Kitchen lisinopril (ZESTRIL) 5 MG tablet Take 5 mg by mouth daily.    . mirtazapine (REMERON) 15 MG tablet Take 15 mg by mouth at bedtime.    Marland Kitchen omeprazole (PRILOSEC) 20 MG capsule Take 20 mg by mouth daily.    Marland Kitchen  oxybutynin (DITROPAN XL) 15 MG 24 hr tablet Take 15 mg by mouth at bedtime.     No current facility-administered medications for this visit.     Past Surgical History:  Procedure Laterality Date  . TOTAL KNEE ARTHROPLASTY       Allergies  Allergen Reactions  . Other     Anti-inflammatory's-causes stomach ulcers  . Zetia [Ezetimibe] Other (See Comments)    Lightheadedness       Family History  Problem Relation Age of Onset  . ALS Brother   . Heart disease Brother   . Hypertension Brother   . Arthritis Brother   . Diabetes Sister      Social History Ms. Burkes reports that she has never smoked. She has never used smokeless tobacco. Ms. Keil reports no history of alcohol use.   Review of Systems CONSTITUTIONAL: No weight loss, fever, chills, weakness or fatigue.  HEENT: Eyes: No visual loss, blurred vision, double vision or yellow sclerae.No hearing loss, sneezing, congestion, runny nose or sore throat.  SKIN: No rash or itching.  CARDIOVASCULAR:  RESPIRATORY: No shortness of breath, cough or sputum.  GASTROINTESTINAL: No anorexia, nausea, vomiting or diarrhea. No abdominal pain  or blood.  GENITOURINARY: No burning on urination, no polyuria NEUROLOGICAL: No headache, dizziness, syncope, paralysis, ataxia, numbness or tingling in the extremities. No change in bowel or bladder control.  MUSCULOSKELETAL: No muscle, back pain, joint pain or stiffness.  LYMPHATICS: No enlarged nodes. No history of splenectomy.  PSYCHIATRIC: No history of depression or anxiety.  ENDOCRINOLOGIC: No reports of sweating, cold or heat intolerance. No polyuria or polydipsia.  Marland Kitchen   Physical Examination There were no vitals filed for this visit. There were no vitals filed for this visit.  Gen: resting comfortably, no acute distress HEENT: no scleral icterus, pupils equal round and reactive, no palptable cervical adenopathy,  CV Resp: Clear to auscultation bilaterally GI: abdomen is  soft, non-tender, non-distended, normal bowel sounds, no hepatosplenomegaly MSK: extremities are warm, no edema.  Skin: warm, no rash Neuro:  no focal deficits Psych: appropriate affect   Diagnostic Studies 06/2020 echo Summary  1. Technically difficult study.  2. The left ventricle is normal in size with normal wall thickness.  3. The left ventricular systolic function is normal, LVEF is visually  estimated at 60-65%.  4. The left atrium is moderatelyto severely dilated in size.  5. The right ventricle is moderately dilated in size, with low normal  systolic function.  6. There is moderate tricuspid regurgitation.  7. There is severe pulmonary hypertension, estimated pulmonary artery  systolicpressure is 65 mmHg.  8. The right atrium is severely dilated in size.       Assessment and Plan        Antoine Poche, M.D., F.A.C.C.

## 2020-08-20 DIAGNOSIS — I10 Essential (primary) hypertension: Secondary | ICD-10-CM | POA: Diagnosis not present

## 2020-08-20 DIAGNOSIS — E039 Hypothyroidism, unspecified: Secondary | ICD-10-CM | POA: Diagnosis present

## 2020-08-20 DIAGNOSIS — Z7982 Long term (current) use of aspirin: Secondary | ICD-10-CM | POA: Diagnosis not present

## 2020-08-20 DIAGNOSIS — J90 Pleural effusion, not elsewhere classified: Secondary | ICD-10-CM | POA: Diagnosis not present

## 2020-08-20 DIAGNOSIS — R0902 Hypoxemia: Secondary | ICD-10-CM | POA: Diagnosis not present

## 2020-08-20 DIAGNOSIS — Z79890 Hormone replacement therapy (postmenopausal): Secondary | ICD-10-CM | POA: Diagnosis not present

## 2020-08-20 DIAGNOSIS — I517 Cardiomegaly: Secondary | ICD-10-CM | POA: Diagnosis not present

## 2020-08-20 DIAGNOSIS — R0602 Shortness of breath: Secondary | ICD-10-CM | POA: Diagnosis not present

## 2020-08-20 DIAGNOSIS — I5023 Acute on chronic systolic (congestive) heart failure: Secondary | ICD-10-CM | POA: Diagnosis present

## 2020-08-20 DIAGNOSIS — I2729 Other secondary pulmonary hypertension: Secondary | ICD-10-CM | POA: Diagnosis not present

## 2020-08-20 DIAGNOSIS — N3000 Acute cystitis without hematuria: Secondary | ICD-10-CM | POA: Diagnosis not present

## 2020-08-20 DIAGNOSIS — Z791 Long term (current) use of non-steroidal anti-inflammatories (NSAID): Secondary | ICD-10-CM | POA: Diagnosis not present

## 2020-08-20 DIAGNOSIS — Z66 Do not resuscitate: Secondary | ICD-10-CM | POA: Diagnosis present

## 2020-08-20 DIAGNOSIS — Z20822 Contact with and (suspected) exposure to covid-19: Secondary | ICD-10-CM | POA: Diagnosis not present

## 2020-08-20 DIAGNOSIS — N390 Urinary tract infection, site not specified: Secondary | ICD-10-CM | POA: Diagnosis not present

## 2020-08-20 DIAGNOSIS — R0600 Dyspnea, unspecified: Secondary | ICD-10-CM | POA: Diagnosis not present

## 2020-08-20 DIAGNOSIS — J189 Pneumonia, unspecified organism: Secondary | ICD-10-CM | POA: Diagnosis not present

## 2020-08-20 DIAGNOSIS — I272 Pulmonary hypertension, unspecified: Secondary | ICD-10-CM | POA: Diagnosis present

## 2020-08-20 DIAGNOSIS — Z96659 Presence of unspecified artificial knee joint: Secondary | ICD-10-CM | POA: Diagnosis present

## 2020-08-20 DIAGNOSIS — G4733 Obstructive sleep apnea (adult) (pediatric): Secondary | ICD-10-CM | POA: Diagnosis present

## 2020-08-20 DIAGNOSIS — F0390 Unspecified dementia without behavioral disturbance: Secondary | ICD-10-CM | POA: Diagnosis present

## 2020-08-20 DIAGNOSIS — J9621 Acute and chronic respiratory failure with hypoxia: Secondary | ICD-10-CM | POA: Diagnosis present

## 2020-08-20 DIAGNOSIS — J9811 Atelectasis: Secondary | ICD-10-CM | POA: Diagnosis not present

## 2020-08-20 DIAGNOSIS — R5381 Other malaise: Secondary | ICD-10-CM | POA: Diagnosis present

## 2020-08-20 DIAGNOSIS — M1990 Unspecified osteoarthritis, unspecified site: Secondary | ICD-10-CM | POA: Diagnosis present

## 2020-08-20 DIAGNOSIS — I509 Heart failure, unspecified: Secondary | ICD-10-CM | POA: Diagnosis not present

## 2020-08-20 DIAGNOSIS — E871 Hypo-osmolality and hyponatremia: Secondary | ICD-10-CM | POA: Diagnosis present

## 2020-08-20 DIAGNOSIS — J9601 Acute respiratory failure with hypoxia: Secondary | ICD-10-CM | POA: Diagnosis not present

## 2020-08-20 DIAGNOSIS — I501 Left ventricular failure: Secondary | ICD-10-CM | POA: Diagnosis not present

## 2020-08-20 DIAGNOSIS — J811 Chronic pulmonary edema: Secondary | ICD-10-CM | POA: Diagnosis not present

## 2020-08-20 DIAGNOSIS — F419 Anxiety disorder, unspecified: Secondary | ICD-10-CM | POA: Diagnosis present

## 2020-08-20 DIAGNOSIS — I50811 Acute right heart failure: Secondary | ICD-10-CM | POA: Diagnosis not present

## 2020-08-25 DIAGNOSIS — N319 Neuromuscular dysfunction of bladder, unspecified: Secondary | ICD-10-CM | POA: Diagnosis not present

## 2020-08-25 DIAGNOSIS — R350 Frequency of micturition: Secondary | ICD-10-CM | POA: Diagnosis not present

## 2020-08-25 DIAGNOSIS — I959 Hypotension, unspecified: Secondary | ICD-10-CM | POA: Diagnosis not present

## 2020-08-25 DIAGNOSIS — I10 Essential (primary) hypertension: Secondary | ICD-10-CM | POA: Diagnosis not present

## 2020-08-25 DIAGNOSIS — L03116 Cellulitis of left lower limb: Secondary | ICD-10-CM | POA: Diagnosis not present

## 2020-08-25 DIAGNOSIS — I272 Pulmonary hypertension, unspecified: Secondary | ICD-10-CM | POA: Diagnosis not present

## 2020-08-25 DIAGNOSIS — I110 Hypertensive heart disease with heart failure: Secondary | ICD-10-CM | POA: Diagnosis not present

## 2020-08-25 DIAGNOSIS — I5033 Acute on chronic diastolic (congestive) heart failure: Secondary | ICD-10-CM | POA: Diagnosis not present

## 2020-08-25 DIAGNOSIS — Z7401 Bed confinement status: Secondary | ICD-10-CM | POA: Diagnosis not present

## 2020-08-25 DIAGNOSIS — R0600 Dyspnea, unspecified: Secondary | ICD-10-CM | POA: Diagnosis not present

## 2020-08-25 DIAGNOSIS — F0390 Unspecified dementia without behavioral disturbance: Secondary | ICD-10-CM | POA: Diagnosis not present

## 2020-08-25 DIAGNOSIS — R262 Difficulty in walking, not elsewhere classified: Secondary | ICD-10-CM | POA: Diagnosis not present

## 2020-08-25 DIAGNOSIS — K5900 Constipation, unspecified: Secondary | ICD-10-CM | POA: Diagnosis not present

## 2020-08-25 DIAGNOSIS — F419 Anxiety disorder, unspecified: Secondary | ICD-10-CM | POA: Diagnosis not present

## 2020-08-25 DIAGNOSIS — N390 Urinary tract infection, site not specified: Secondary | ICD-10-CM | POA: Diagnosis not present

## 2020-08-25 DIAGNOSIS — G894 Chronic pain syndrome: Secondary | ICD-10-CM | POA: Diagnosis not present

## 2020-08-25 DIAGNOSIS — L0390 Cellulitis, unspecified: Secondary | ICD-10-CM | POA: Diagnosis not present

## 2020-08-25 DIAGNOSIS — R279 Unspecified lack of coordination: Secondary | ICD-10-CM | POA: Diagnosis not present

## 2020-08-25 DIAGNOSIS — G5790 Unspecified mononeuropathy of unspecified lower limb: Secondary | ICD-10-CM | POA: Diagnosis not present

## 2020-08-25 DIAGNOSIS — N3281 Overactive bladder: Secondary | ICD-10-CM | POA: Diagnosis not present

## 2020-08-25 DIAGNOSIS — I5032 Chronic diastolic (congestive) heart failure: Secondary | ICD-10-CM | POA: Diagnosis not present

## 2020-08-25 DIAGNOSIS — E039 Hypothyroidism, unspecified: Secondary | ICD-10-CM | POA: Diagnosis not present

## 2020-08-25 DIAGNOSIS — L03115 Cellulitis of right lower limb: Secondary | ICD-10-CM | POA: Diagnosis not present

## 2020-08-25 DIAGNOSIS — J9600 Acute respiratory failure, unspecified whether with hypoxia or hypercapnia: Secondary | ICD-10-CM | POA: Diagnosis not present

## 2020-08-25 DIAGNOSIS — Z7409 Other reduced mobility: Secondary | ICD-10-CM | POA: Diagnosis not present

## 2020-08-25 DIAGNOSIS — G8929 Other chronic pain: Secondary | ICD-10-CM | POA: Diagnosis not present

## 2020-08-25 DIAGNOSIS — I872 Venous insufficiency (chronic) (peripheral): Secondary | ICD-10-CM | POA: Diagnosis not present

## 2020-08-25 DIAGNOSIS — G629 Polyneuropathy, unspecified: Secondary | ICD-10-CM | POA: Diagnosis not present

## 2020-08-25 DIAGNOSIS — R0902 Hypoxemia: Secondary | ICD-10-CM | POA: Diagnosis not present

## 2020-08-25 DIAGNOSIS — L03119 Cellulitis of unspecified part of limb: Secondary | ICD-10-CM | POA: Diagnosis not present

## 2020-09-05 DIAGNOSIS — J9601 Acute respiratory failure with hypoxia: Secondary | ICD-10-CM | POA: Diagnosis not present

## 2020-09-05 DIAGNOSIS — R2689 Other abnormalities of gait and mobility: Secondary | ICD-10-CM | POA: Diagnosis not present

## 2020-09-05 DIAGNOSIS — F0390 Unspecified dementia without behavioral disturbance: Secondary | ICD-10-CM | POA: Diagnosis not present

## 2020-09-05 DIAGNOSIS — I5033 Acute on chronic diastolic (congestive) heart failure: Secondary | ICD-10-CM | POA: Diagnosis not present

## 2020-09-05 DIAGNOSIS — M6281 Muscle weakness (generalized): Secondary | ICD-10-CM | POA: Diagnosis not present

## 2020-09-05 DIAGNOSIS — I110 Hypertensive heart disease with heart failure: Secondary | ICD-10-CM | POA: Diagnosis not present

## 2020-09-05 DIAGNOSIS — G609 Hereditary and idiopathic neuropathy, unspecified: Secondary | ICD-10-CM | POA: Diagnosis not present

## 2020-09-05 DIAGNOSIS — I272 Pulmonary hypertension, unspecified: Secondary | ICD-10-CM | POA: Diagnosis not present

## 2020-09-05 DIAGNOSIS — H409 Unspecified glaucoma: Secondary | ICD-10-CM | POA: Diagnosis not present

## 2020-09-11 DIAGNOSIS — G609 Hereditary and idiopathic neuropathy, unspecified: Secondary | ICD-10-CM | POA: Diagnosis not present

## 2020-09-11 DIAGNOSIS — I110 Hypertensive heart disease with heart failure: Secondary | ICD-10-CM | POA: Diagnosis not present

## 2020-09-11 DIAGNOSIS — I5033 Acute on chronic diastolic (congestive) heart failure: Secondary | ICD-10-CM | POA: Diagnosis not present

## 2020-09-11 DIAGNOSIS — R2689 Other abnormalities of gait and mobility: Secondary | ICD-10-CM | POA: Diagnosis not present

## 2020-09-11 DIAGNOSIS — I272 Pulmonary hypertension, unspecified: Secondary | ICD-10-CM | POA: Diagnosis not present

## 2020-09-11 DIAGNOSIS — J9601 Acute respiratory failure with hypoxia: Secondary | ICD-10-CM | POA: Diagnosis not present

## 2020-09-12 DIAGNOSIS — I5030 Unspecified diastolic (congestive) heart failure: Secondary | ICD-10-CM | POA: Diagnosis not present

## 2020-09-12 DIAGNOSIS — N342 Other urethritis: Secondary | ICD-10-CM | POA: Diagnosis not present

## 2020-09-12 DIAGNOSIS — Z6833 Body mass index (BMI) 33.0-33.9, adult: Secondary | ICD-10-CM | POA: Diagnosis not present

## 2020-09-12 DIAGNOSIS — R0902 Hypoxemia: Secondary | ICD-10-CM | POA: Diagnosis not present

## 2020-09-12 DIAGNOSIS — Z1331 Encounter for screening for depression: Secondary | ICD-10-CM | POA: Diagnosis not present

## 2020-09-12 DIAGNOSIS — L0390 Cellulitis, unspecified: Secondary | ICD-10-CM | POA: Diagnosis not present

## 2020-09-13 DIAGNOSIS — I272 Pulmonary hypertension, unspecified: Secondary | ICD-10-CM | POA: Diagnosis not present

## 2020-09-13 DIAGNOSIS — J9601 Acute respiratory failure with hypoxia: Secondary | ICD-10-CM | POA: Diagnosis not present

## 2020-09-13 DIAGNOSIS — I110 Hypertensive heart disease with heart failure: Secondary | ICD-10-CM | POA: Diagnosis not present

## 2020-09-13 DIAGNOSIS — I5033 Acute on chronic diastolic (congestive) heart failure: Secondary | ICD-10-CM | POA: Diagnosis not present

## 2020-09-13 DIAGNOSIS — G609 Hereditary and idiopathic neuropathy, unspecified: Secondary | ICD-10-CM | POA: Diagnosis not present

## 2020-09-13 DIAGNOSIS — R2689 Other abnormalities of gait and mobility: Secondary | ICD-10-CM | POA: Diagnosis not present

## 2020-09-14 DIAGNOSIS — R2689 Other abnormalities of gait and mobility: Secondary | ICD-10-CM | POA: Diagnosis not present

## 2020-09-14 DIAGNOSIS — I272 Pulmonary hypertension, unspecified: Secondary | ICD-10-CM | POA: Diagnosis not present

## 2020-09-14 DIAGNOSIS — I110 Hypertensive heart disease with heart failure: Secondary | ICD-10-CM | POA: Diagnosis not present

## 2020-09-14 DIAGNOSIS — J9601 Acute respiratory failure with hypoxia: Secondary | ICD-10-CM | POA: Diagnosis not present

## 2020-09-14 DIAGNOSIS — G609 Hereditary and idiopathic neuropathy, unspecified: Secondary | ICD-10-CM | POA: Diagnosis not present

## 2020-09-14 DIAGNOSIS — I5033 Acute on chronic diastolic (congestive) heart failure: Secondary | ICD-10-CM | POA: Diagnosis not present

## 2020-09-15 DIAGNOSIS — G609 Hereditary and idiopathic neuropathy, unspecified: Secondary | ICD-10-CM | POA: Diagnosis not present

## 2020-09-15 DIAGNOSIS — J9601 Acute respiratory failure with hypoxia: Secondary | ICD-10-CM | POA: Diagnosis not present

## 2020-09-15 DIAGNOSIS — I272 Pulmonary hypertension, unspecified: Secondary | ICD-10-CM | POA: Diagnosis not present

## 2020-09-15 DIAGNOSIS — R2689 Other abnormalities of gait and mobility: Secondary | ICD-10-CM | POA: Diagnosis not present

## 2020-09-15 DIAGNOSIS — I110 Hypertensive heart disease with heart failure: Secondary | ICD-10-CM | POA: Diagnosis not present

## 2020-09-15 DIAGNOSIS — I5033 Acute on chronic diastolic (congestive) heart failure: Secondary | ICD-10-CM | POA: Diagnosis not present

## 2020-09-17 ENCOUNTER — Encounter: Payer: Self-pay | Admitting: Cardiology

## 2020-09-17 ENCOUNTER — Ambulatory Visit (INDEPENDENT_AMBULATORY_CARE_PROVIDER_SITE_OTHER): Payer: Medicare Other | Admitting: Cardiology

## 2020-09-17 VITALS — BP 110/64 | HR 63 | Ht 62.0 in | Wt 177.0 lb

## 2020-09-17 DIAGNOSIS — I5032 Chronic diastolic (congestive) heart failure: Secondary | ICD-10-CM | POA: Diagnosis not present

## 2020-09-17 DIAGNOSIS — I4819 Other persistent atrial fibrillation: Secondary | ICD-10-CM

## 2020-09-17 DIAGNOSIS — I10 Essential (primary) hypertension: Secondary | ICD-10-CM

## 2020-09-17 MED ORDER — APIXABAN 5 MG PO TABS: 5.0000 mg | ORAL_TABLET | Freq: Two times a day (BID) | ORAL | 6 refills | Status: DC

## 2020-09-17 NOTE — Progress Notes (Signed)
Cardiology Office Note  Date: 09/17/2020   ID: Christina Frey, DOB 20-May-1941, MRN [id number]  PCP:  Elfredia Nevins, MD  Cardiologist:  Nona Dell, MD Electrophysiologist:  None   Chief Complaint  Patient presents with  . Congestive Heart Failure    History of Present Illness: Christina Frey is an 80 y.o. female referred for cardiology consultation by Ms. Christina Rosenthal PA-C with Dr. Sherwood Gambler with reported diagnosis of congestive heart failure.  I reviewed the available information which is limited.  She is here today with two of her sons.  I was able to review information in Care Everywhere.  She was diagnosed with acute on chronic diastolic heart failure during hospital stay at [facility] in December 2021.  Echocardiogram performed at that same facility in October 2021 reported LVEF 60 to 65% with moderate RV dilatation and low normal contraction, severe pulmonary hypertension, and severe biatrial enlargement.  She was also diagnosed with atrial fibrillation, duration is uncertain but looks to be fairly longstanding, at least over several months.  She was not aware of this diagnosis.  She most recently has been in a rehabilitation facility in [place], has been back home since after Christmas.  CHA2DS2-VASc score is at least 6.  We discussed rationale for anticoagulation to reduce stroke risk versus continued use of aspirin.  She reports a remote history of PUD and bleeding ulcer, not in many years.  She is mildly anemic by her most recent lab work, creatinine normal.  She reports a longstanding history of heart murmur, echocardiogram done at [facility] in October did not report any major valvular abnormalities beyond moderate tricuspid regurgitation.  She states that the leg swelling she was experiencing at the time of fluid overload in December 2021 has significantly improved on current dose of Lasix.  She plans to get a scale to follow daily weights.  Past Medical History:   Diagnosis Date  . Chronic pain   . Essential hypertension   . GERD (gastroesophageal reflux disease)   . Hyperlipidemia   . Hypothyroidism   . Obesity   . OSA (obstructive sleep apnea)    Unable to wear CPAP  . Osteoarthritis   . PUD (peptic ulcer disease)   . Type 2 diabetes mellitus (HCC)     Past Surgical History:  Procedure Laterality Date  . TOTAL KNEE ARTHROPLASTY    . TUBAL LIGATION      Current Outpatient Medications  Medication Sig Dispense Refill  . apixaban (ELIQUIS) 5 MG TABS tablet Take 1 tablet (5 mg total) by mouth 2 (two) times daily. 60 tablet 6  . cephALEXin (KEFLEX) 500 MG capsule Take 500 mg by mouth 4 (four) times daily.    . citalopram (CELEXA) 20 MG tablet Take 10 mg by mouth daily.    . DULOXETINE HCL PO Take 20 mg by mouth in the morning and at bedtime.    . furosemide (LASIX) 40 MG tablet Take 40 mg by mouth daily.    Marland Kitchen gabapentin (NEURONTIN) 300 MG capsule Take 1 capsule by mouth 3 (three) times daily.     Marland Kitchen levothyroxine (SYNTHROID) 100 MCG tablet Take 100 mcg by mouth daily before breakfast.    . lisinopril (ZESTRIL) 5 MG tablet Take 5 mg by mouth daily.    . mirtazapine (REMERON) 15 MG tablet Take 15 mg by mouth at bedtime.    Marland Kitchen oxybutynin (DITROPAN XL) 15 MG 24 hr tablet Take 15 mg by mouth at bedtime.    Marland Kitchen  pantoprazole (PROTONIX) 40 MG tablet Take 40 mg by mouth daily.     No current facility-administered medications for this visit.   Allergies:  Other and Zetia [ezetimibe]   Social History: The patient  reports that she has never smoked. She has never used smokeless tobacco. She reports that she does not drink alcohol and does not use drugs.   Family History: The patient's family history includes ALS in her brother; Arthritis in her brother; Diabetes in her sister; Heart disease in her brother; Hypertension in her brother.   ROS: Uses a walker to ambulate.  Reports neuropathy pain, mainly right leg.  No orthopnea or PND.  Physical  Exam: VS:  BP 110/64   Pulse 63   Ht 5\' 2"  (1.575 m)   Wt 177 lb (80.3 kg)   BMI 32.37 kg/m , BMI Body mass index is 32.37 kg/m.  Wt Readings from Last 3 Encounters:  09/17/20 177 lb (80.3 kg)  10/26/16 201 lb 4.5 oz (91.3 kg)  10/18/15 213 lb (96.6 kg)    General: Elderly woman, appears comfortable at rest. HEENT: Conjunctiva and lids normal, wearing a mask. Neck: Supple, no elevated JVP or carotid bruits, no thyromegaly. Lungs: Clear to auscultation, nonlabored breathing at rest. Cardiac: Irregularly irregular, no S3, 3/6 right basal systolic murmur, no pericardial rub. Abdomen: Soft, nontender, bowel sounds present. Extremities: Venous stasis and mild lower leg edema, distal pulses 2+. Skin: Warm and dry. Musculoskeletal: Kyphosis noted. Neuropsychiatric: Alert and oriented x3, affect grossly appropriate.  ECG:  An ECG dated September 2021 was personally reviewed today and demonstrated:  Probable atrial fibrillation with nonspecific ST changes and intermittent PVCs.  Recent Labwork:  December 2021: Potassium 3.9, BUN 12, creatinine 0.58, hemoglobin 10.3, platelets 236, pro-BNP 6792  Other Studies Reviewed Today:  Echocardiogram 06/26/2020 [facility]): Summary  1. Technically difficult study.  2. The left ventricle is normal in size with normal wall thickness.  3. The left ventricular systolic function is normal, LVEF is visually  estimated at 60-65%.  4. The left atrium is moderatelyto severely dilated in size.  5. The right ventricle is moderately dilated in size, with low normal  systolic function.  6. There is moderate tricuspid regurgitation.  7. There is severe pulmonary hypertension, estimated pulmonary artery  systolicpressure is 65 mmHg.  8. The right atrium is severely dilated in size.   Assessment and Plan:  1. Persistent atrial fibrillation, duration uncertain but suspect longstanding.  CHA2DS2-VASc score is at least 6 as  discussed above.  Heart rate is reasonable at rest in the absence of AV nodal blockers.  We discussed initiating Eliquis 5 mg twice daily instead of aspirin.  Does have remote history of PUD, we discussed potential bleeding risk.  I reviewed her baseline lab work from December 2021.  2. Chronic diastolic heart failure, LVEF 60 to 65% by echocardiogram at [facility] in October 2021.  Also moderate RV dilatation with low normal contraction and severe pulmonary hypertension.  Continue current dose of Lasix along with lisinopril.  She plans to get a scale to track daily weights.  Scheduling clinical follow-up in the next 6 weeks, consider BMET at that time if not done in the interim by PCP.  Medication Adjustments/Labs and Tests Ordered: Current medicines are reviewed at length with the patient today.  Concerns regarding medicines are outlined above.   Tests Ordered: Orders Placed This Encounter  Procedures  . EKG 12-Lead    Medication Changes: Meds ordered this encounter  Medications  . apixaban (ELIQUIS) 5 MG TABS tablet    Sig: Take 1 tablet (5 mg total) by mouth 2 (two) times daily.    Dispense:  60 tablet    Refill:  6    09/17/2020 NEW    Disposition:  Follow up 6 weeks in the [place] office with [place].  Signed, Jonelle Sidle, MD, [facility] 09/17/2020 5:02 PM    [facility] [address] [place], [place], [place] [address] Phone: [phone]; Fax: [phone]

## 2020-09-17 NOTE — Patient Instructions (Addendum)
Medication Instructions:   Your physician has recommended you make the following change in your medication:   Stop aspirin  Start eliquis 5 mg by mouth twice daily-take voucher to pharmacy to get 1st 30 days free.  Continue other medications the same  Labwork:  none  Testing/Procedures:  none  Follow-Up:  Your physician recommends that you schedule a follow-up appointment in: 6 weeks.  Any Other Special Instructions Will Be Listed Below (If Applicable).  If you need a refill on your cardiac medications before your next appointment, please call your pharmacy.

## 2020-09-18 DIAGNOSIS — I272 Pulmonary hypertension, unspecified: Secondary | ICD-10-CM | POA: Diagnosis not present

## 2020-09-18 DIAGNOSIS — J9601 Acute respiratory failure with hypoxia: Secondary | ICD-10-CM | POA: Diagnosis not present

## 2020-09-18 DIAGNOSIS — R2689 Other abnormalities of gait and mobility: Secondary | ICD-10-CM | POA: Diagnosis not present

## 2020-09-18 DIAGNOSIS — G609 Hereditary and idiopathic neuropathy, unspecified: Secondary | ICD-10-CM | POA: Diagnosis not present

## 2020-09-18 DIAGNOSIS — I110 Hypertensive heart disease with heart failure: Secondary | ICD-10-CM | POA: Diagnosis not present

## 2020-09-18 DIAGNOSIS — I5033 Acute on chronic diastolic (congestive) heart failure: Secondary | ICD-10-CM | POA: Diagnosis not present

## 2020-09-19 DIAGNOSIS — I110 Hypertensive heart disease with heart failure: Secondary | ICD-10-CM | POA: Diagnosis not present

## 2020-09-19 DIAGNOSIS — R2689 Other abnormalities of gait and mobility: Secondary | ICD-10-CM | POA: Diagnosis not present

## 2020-09-19 DIAGNOSIS — J9601 Acute respiratory failure with hypoxia: Secondary | ICD-10-CM | POA: Diagnosis not present

## 2020-09-19 DIAGNOSIS — I272 Pulmonary hypertension, unspecified: Secondary | ICD-10-CM | POA: Diagnosis not present

## 2020-09-19 DIAGNOSIS — I5033 Acute on chronic diastolic (congestive) heart failure: Secondary | ICD-10-CM | POA: Diagnosis not present

## 2020-09-19 DIAGNOSIS — G609 Hereditary and idiopathic neuropathy, unspecified: Secondary | ICD-10-CM | POA: Diagnosis not present

## 2020-09-20 DIAGNOSIS — I5033 Acute on chronic diastolic (congestive) heart failure: Secondary | ICD-10-CM | POA: Diagnosis not present

## 2020-09-20 DIAGNOSIS — R2689 Other abnormalities of gait and mobility: Secondary | ICD-10-CM | POA: Diagnosis not present

## 2020-09-20 DIAGNOSIS — G609 Hereditary and idiopathic neuropathy, unspecified: Secondary | ICD-10-CM | POA: Diagnosis not present

## 2020-09-20 DIAGNOSIS — J9601 Acute respiratory failure with hypoxia: Secondary | ICD-10-CM | POA: Diagnosis not present

## 2020-09-20 DIAGNOSIS — I272 Pulmonary hypertension, unspecified: Secondary | ICD-10-CM | POA: Diagnosis not present

## 2020-09-20 DIAGNOSIS — I110 Hypertensive heart disease with heart failure: Secondary | ICD-10-CM | POA: Diagnosis not present

## 2020-09-27 DIAGNOSIS — I272 Pulmonary hypertension, unspecified: Secondary | ICD-10-CM | POA: Diagnosis not present

## 2020-09-27 DIAGNOSIS — I5033 Acute on chronic diastolic (congestive) heart failure: Secondary | ICD-10-CM | POA: Diagnosis not present

## 2020-09-27 DIAGNOSIS — R2689 Other abnormalities of gait and mobility: Secondary | ICD-10-CM | POA: Diagnosis not present

## 2020-09-27 DIAGNOSIS — I110 Hypertensive heart disease with heart failure: Secondary | ICD-10-CM | POA: Diagnosis not present

## 2020-09-27 DIAGNOSIS — G609 Hereditary and idiopathic neuropathy, unspecified: Secondary | ICD-10-CM | POA: Diagnosis not present

## 2020-09-27 DIAGNOSIS — J9601 Acute respiratory failure with hypoxia: Secondary | ICD-10-CM | POA: Diagnosis not present

## 2020-09-28 DIAGNOSIS — I272 Pulmonary hypertension, unspecified: Secondary | ICD-10-CM | POA: Diagnosis not present

## 2020-09-28 DIAGNOSIS — R2689 Other abnormalities of gait and mobility: Secondary | ICD-10-CM | POA: Diagnosis not present

## 2020-09-28 DIAGNOSIS — J9601 Acute respiratory failure with hypoxia: Secondary | ICD-10-CM | POA: Diagnosis not present

## 2020-09-28 DIAGNOSIS — I5033 Acute on chronic diastolic (congestive) heart failure: Secondary | ICD-10-CM | POA: Diagnosis not present

## 2020-09-28 DIAGNOSIS — G609 Hereditary and idiopathic neuropathy, unspecified: Secondary | ICD-10-CM | POA: Diagnosis not present

## 2020-09-28 DIAGNOSIS — I110 Hypertensive heart disease with heart failure: Secondary | ICD-10-CM | POA: Diagnosis not present

## 2020-10-01 DIAGNOSIS — R2689 Other abnormalities of gait and mobility: Secondary | ICD-10-CM | POA: Diagnosis not present

## 2020-10-01 DIAGNOSIS — I110 Hypertensive heart disease with heart failure: Secondary | ICD-10-CM | POA: Diagnosis not present

## 2020-10-01 DIAGNOSIS — J9601 Acute respiratory failure with hypoxia: Secondary | ICD-10-CM | POA: Diagnosis not present

## 2020-10-01 DIAGNOSIS — I272 Pulmonary hypertension, unspecified: Secondary | ICD-10-CM | POA: Diagnosis not present

## 2020-10-01 DIAGNOSIS — G609 Hereditary and idiopathic neuropathy, unspecified: Secondary | ICD-10-CM | POA: Diagnosis not present

## 2020-10-01 DIAGNOSIS — I5033 Acute on chronic diastolic (congestive) heart failure: Secondary | ICD-10-CM | POA: Diagnosis not present

## 2020-10-03 DIAGNOSIS — R2689 Other abnormalities of gait and mobility: Secondary | ICD-10-CM | POA: Diagnosis not present

## 2020-10-03 DIAGNOSIS — G609 Hereditary and idiopathic neuropathy, unspecified: Secondary | ICD-10-CM | POA: Diagnosis not present

## 2020-10-03 DIAGNOSIS — I272 Pulmonary hypertension, unspecified: Secondary | ICD-10-CM | POA: Diagnosis not present

## 2020-10-03 DIAGNOSIS — I5033 Acute on chronic diastolic (congestive) heart failure: Secondary | ICD-10-CM | POA: Diagnosis not present

## 2020-10-03 DIAGNOSIS — J9601 Acute respiratory failure with hypoxia: Secondary | ICD-10-CM | POA: Diagnosis not present

## 2020-10-03 DIAGNOSIS — I110 Hypertensive heart disease with heart failure: Secondary | ICD-10-CM | POA: Diagnosis not present

## 2020-10-04 DIAGNOSIS — I5033 Acute on chronic diastolic (congestive) heart failure: Secondary | ICD-10-CM | POA: Diagnosis not present

## 2020-10-04 DIAGNOSIS — I272 Pulmonary hypertension, unspecified: Secondary | ICD-10-CM | POA: Diagnosis not present

## 2020-10-04 DIAGNOSIS — I110 Hypertensive heart disease with heart failure: Secondary | ICD-10-CM | POA: Diagnosis not present

## 2020-10-04 DIAGNOSIS — J9601 Acute respiratory failure with hypoxia: Secondary | ICD-10-CM | POA: Diagnosis not present

## 2020-10-04 DIAGNOSIS — R2689 Other abnormalities of gait and mobility: Secondary | ICD-10-CM | POA: Diagnosis not present

## 2020-10-04 DIAGNOSIS — G609 Hereditary and idiopathic neuropathy, unspecified: Secondary | ICD-10-CM | POA: Diagnosis not present

## 2020-10-05 DIAGNOSIS — I110 Hypertensive heart disease with heart failure: Secondary | ICD-10-CM | POA: Diagnosis not present

## 2020-10-05 DIAGNOSIS — H409 Unspecified glaucoma: Secondary | ICD-10-CM | POA: Diagnosis not present

## 2020-10-05 DIAGNOSIS — F0390 Unspecified dementia without behavioral disturbance: Secondary | ICD-10-CM | POA: Diagnosis not present

## 2020-10-05 DIAGNOSIS — I5033 Acute on chronic diastolic (congestive) heart failure: Secondary | ICD-10-CM | POA: Diagnosis not present

## 2020-10-05 DIAGNOSIS — R2689 Other abnormalities of gait and mobility: Secondary | ICD-10-CM | POA: Diagnosis not present

## 2020-10-05 DIAGNOSIS — I272 Pulmonary hypertension, unspecified: Secondary | ICD-10-CM | POA: Diagnosis not present

## 2020-10-05 DIAGNOSIS — J9601 Acute respiratory failure with hypoxia: Secondary | ICD-10-CM | POA: Diagnosis not present

## 2020-10-05 DIAGNOSIS — M6281 Muscle weakness (generalized): Secondary | ICD-10-CM | POA: Diagnosis not present

## 2020-10-05 DIAGNOSIS — G609 Hereditary and idiopathic neuropathy, unspecified: Secondary | ICD-10-CM | POA: Diagnosis not present

## 2020-10-10 DIAGNOSIS — I272 Pulmonary hypertension, unspecified: Secondary | ICD-10-CM | POA: Diagnosis not present

## 2020-10-10 DIAGNOSIS — J9601 Acute respiratory failure with hypoxia: Secondary | ICD-10-CM | POA: Diagnosis not present

## 2020-10-10 DIAGNOSIS — G609 Hereditary and idiopathic neuropathy, unspecified: Secondary | ICD-10-CM | POA: Diagnosis not present

## 2020-10-10 DIAGNOSIS — R2689 Other abnormalities of gait and mobility: Secondary | ICD-10-CM | POA: Diagnosis not present

## 2020-10-10 DIAGNOSIS — I110 Hypertensive heart disease with heart failure: Secondary | ICD-10-CM | POA: Diagnosis not present

## 2020-10-10 DIAGNOSIS — I5033 Acute on chronic diastolic (congestive) heart failure: Secondary | ICD-10-CM | POA: Diagnosis not present

## 2020-10-12 DIAGNOSIS — I110 Hypertensive heart disease with heart failure: Secondary | ICD-10-CM | POA: Diagnosis not present

## 2020-10-12 DIAGNOSIS — R2689 Other abnormalities of gait and mobility: Secondary | ICD-10-CM | POA: Diagnosis not present

## 2020-10-12 DIAGNOSIS — I272 Pulmonary hypertension, unspecified: Secondary | ICD-10-CM | POA: Diagnosis not present

## 2020-10-12 DIAGNOSIS — I5033 Acute on chronic diastolic (congestive) heart failure: Secondary | ICD-10-CM | POA: Diagnosis not present

## 2020-10-12 DIAGNOSIS — G609 Hereditary and idiopathic neuropathy, unspecified: Secondary | ICD-10-CM | POA: Diagnosis not present

## 2020-10-12 DIAGNOSIS — J9601 Acute respiratory failure with hypoxia: Secondary | ICD-10-CM | POA: Diagnosis not present

## 2020-10-16 DIAGNOSIS — I110 Hypertensive heart disease with heart failure: Secondary | ICD-10-CM | POA: Diagnosis not present

## 2020-10-16 DIAGNOSIS — G609 Hereditary and idiopathic neuropathy, unspecified: Secondary | ICD-10-CM | POA: Diagnosis not present

## 2020-10-16 DIAGNOSIS — I272 Pulmonary hypertension, unspecified: Secondary | ICD-10-CM | POA: Diagnosis not present

## 2020-10-16 DIAGNOSIS — R2689 Other abnormalities of gait and mobility: Secondary | ICD-10-CM | POA: Diagnosis not present

## 2020-10-16 DIAGNOSIS — J9601 Acute respiratory failure with hypoxia: Secondary | ICD-10-CM | POA: Diagnosis not present

## 2020-10-16 DIAGNOSIS — I5033 Acute on chronic diastolic (congestive) heart failure: Secondary | ICD-10-CM | POA: Diagnosis not present

## 2020-10-17 DIAGNOSIS — R2689 Other abnormalities of gait and mobility: Secondary | ICD-10-CM | POA: Diagnosis not present

## 2020-10-17 DIAGNOSIS — I5033 Acute on chronic diastolic (congestive) heart failure: Secondary | ICD-10-CM | POA: Diagnosis not present

## 2020-10-17 DIAGNOSIS — I272 Pulmonary hypertension, unspecified: Secondary | ICD-10-CM | POA: Diagnosis not present

## 2020-10-17 DIAGNOSIS — I110 Hypertensive heart disease with heart failure: Secondary | ICD-10-CM | POA: Diagnosis not present

## 2020-10-17 DIAGNOSIS — J9601 Acute respiratory failure with hypoxia: Secondary | ICD-10-CM | POA: Diagnosis not present

## 2020-10-17 DIAGNOSIS — G609 Hereditary and idiopathic neuropathy, unspecified: Secondary | ICD-10-CM | POA: Diagnosis not present

## 2020-10-18 DIAGNOSIS — J9601 Acute respiratory failure with hypoxia: Secondary | ICD-10-CM | POA: Diagnosis not present

## 2020-10-18 DIAGNOSIS — R2689 Other abnormalities of gait and mobility: Secondary | ICD-10-CM | POA: Diagnosis not present

## 2020-10-18 DIAGNOSIS — I272 Pulmonary hypertension, unspecified: Secondary | ICD-10-CM | POA: Diagnosis not present

## 2020-10-18 DIAGNOSIS — I110 Hypertensive heart disease with heart failure: Secondary | ICD-10-CM | POA: Diagnosis not present

## 2020-10-18 DIAGNOSIS — I5033 Acute on chronic diastolic (congestive) heart failure: Secondary | ICD-10-CM | POA: Diagnosis not present

## 2020-10-18 DIAGNOSIS — G609 Hereditary and idiopathic neuropathy, unspecified: Secondary | ICD-10-CM | POA: Diagnosis not present

## 2020-10-20 DIAGNOSIS — J9601 Acute respiratory failure with hypoxia: Secondary | ICD-10-CM | POA: Diagnosis not present

## 2020-10-20 DIAGNOSIS — I272 Pulmonary hypertension, unspecified: Secondary | ICD-10-CM | POA: Diagnosis not present

## 2020-10-20 DIAGNOSIS — R2689 Other abnormalities of gait and mobility: Secondary | ICD-10-CM | POA: Diagnosis not present

## 2020-10-20 DIAGNOSIS — I5033 Acute on chronic diastolic (congestive) heart failure: Secondary | ICD-10-CM | POA: Diagnosis not present

## 2020-10-20 DIAGNOSIS — I110 Hypertensive heart disease with heart failure: Secondary | ICD-10-CM | POA: Diagnosis not present

## 2020-10-20 DIAGNOSIS — G609 Hereditary and idiopathic neuropathy, unspecified: Secondary | ICD-10-CM | POA: Diagnosis not present

## 2020-10-22 DIAGNOSIS — G609 Hereditary and idiopathic neuropathy, unspecified: Secondary | ICD-10-CM | POA: Diagnosis not present

## 2020-10-22 DIAGNOSIS — J9601 Acute respiratory failure with hypoxia: Secondary | ICD-10-CM | POA: Diagnosis not present

## 2020-10-22 DIAGNOSIS — R2689 Other abnormalities of gait and mobility: Secondary | ICD-10-CM | POA: Diagnosis not present

## 2020-10-22 DIAGNOSIS — I5033 Acute on chronic diastolic (congestive) heart failure: Secondary | ICD-10-CM | POA: Diagnosis not present

## 2020-10-22 DIAGNOSIS — I110 Hypertensive heart disease with heart failure: Secondary | ICD-10-CM | POA: Diagnosis not present

## 2020-10-22 DIAGNOSIS — I272 Pulmonary hypertension, unspecified: Secondary | ICD-10-CM | POA: Diagnosis not present

## 2020-10-23 DIAGNOSIS — G609 Hereditary and idiopathic neuropathy, unspecified: Secondary | ICD-10-CM | POA: Diagnosis not present

## 2020-10-23 DIAGNOSIS — I110 Hypertensive heart disease with heart failure: Secondary | ICD-10-CM | POA: Diagnosis not present

## 2020-10-23 DIAGNOSIS — R2689 Other abnormalities of gait and mobility: Secondary | ICD-10-CM | POA: Diagnosis not present

## 2020-10-23 DIAGNOSIS — I272 Pulmonary hypertension, unspecified: Secondary | ICD-10-CM | POA: Diagnosis not present

## 2020-10-23 DIAGNOSIS — I5033 Acute on chronic diastolic (congestive) heart failure: Secondary | ICD-10-CM | POA: Diagnosis not present

## 2020-10-23 DIAGNOSIS — J9601 Acute respiratory failure with hypoxia: Secondary | ICD-10-CM | POA: Diagnosis not present

## 2020-10-24 DIAGNOSIS — R2689 Other abnormalities of gait and mobility: Secondary | ICD-10-CM | POA: Diagnosis not present

## 2020-10-24 DIAGNOSIS — I5033 Acute on chronic diastolic (congestive) heart failure: Secondary | ICD-10-CM | POA: Diagnosis not present

## 2020-10-24 DIAGNOSIS — J9601 Acute respiratory failure with hypoxia: Secondary | ICD-10-CM | POA: Diagnosis not present

## 2020-10-24 DIAGNOSIS — I110 Hypertensive heart disease with heart failure: Secondary | ICD-10-CM | POA: Diagnosis not present

## 2020-10-24 DIAGNOSIS — G609 Hereditary and idiopathic neuropathy, unspecified: Secondary | ICD-10-CM | POA: Diagnosis not present

## 2020-10-24 DIAGNOSIS — I272 Pulmonary hypertension, unspecified: Secondary | ICD-10-CM | POA: Diagnosis not present

## 2020-10-26 DIAGNOSIS — J9601 Acute respiratory failure with hypoxia: Secondary | ICD-10-CM | POA: Diagnosis not present

## 2020-10-26 DIAGNOSIS — R2689 Other abnormalities of gait and mobility: Secondary | ICD-10-CM | POA: Diagnosis not present

## 2020-10-26 DIAGNOSIS — G609 Hereditary and idiopathic neuropathy, unspecified: Secondary | ICD-10-CM | POA: Diagnosis not present

## 2020-10-26 DIAGNOSIS — I5033 Acute on chronic diastolic (congestive) heart failure: Secondary | ICD-10-CM | POA: Diagnosis not present

## 2020-10-26 DIAGNOSIS — I272 Pulmonary hypertension, unspecified: Secondary | ICD-10-CM | POA: Diagnosis not present

## 2020-10-26 DIAGNOSIS — I110 Hypertensive heart disease with heart failure: Secondary | ICD-10-CM | POA: Diagnosis not present

## 2020-10-29 DIAGNOSIS — I110 Hypertensive heart disease with heart failure: Secondary | ICD-10-CM | POA: Diagnosis not present

## 2020-10-29 DIAGNOSIS — R2689 Other abnormalities of gait and mobility: Secondary | ICD-10-CM | POA: Diagnosis not present

## 2020-10-29 DIAGNOSIS — J9601 Acute respiratory failure with hypoxia: Secondary | ICD-10-CM | POA: Diagnosis not present

## 2020-10-29 DIAGNOSIS — I272 Pulmonary hypertension, unspecified: Secondary | ICD-10-CM | POA: Diagnosis not present

## 2020-10-29 DIAGNOSIS — G609 Hereditary and idiopathic neuropathy, unspecified: Secondary | ICD-10-CM | POA: Diagnosis not present

## 2020-10-29 DIAGNOSIS — I5033 Acute on chronic diastolic (congestive) heart failure: Secondary | ICD-10-CM | POA: Diagnosis not present

## 2020-10-30 NOTE — Progress Notes (Unsigned)
Cardiology Office Note    Date:  10/31/2020   ID:  Christina Frey, Christina Frey [birth year], MRN [id number]  PCP:  Elfredia Nevins, MD  Cardiologist: Nona Dell, MD    Chief Complaint  Patient presents with  . Follow-up    6 week visit    History of Present Illness:    Christina Frey is a 80 y.o. female with past medical history of persistent atrial fibrillation, chronic diastolic CHF, HTN, Hypothyroidism, PUD and OSA (intolerant to CPAP) who presents to the office today for 6-week follow-up.   She was examined by Dr. Diona Browner in 09/2020 as a new patient referral for CHF as she had been hospitalized for an acute diastolic CHF exacerbation in 08/2020 and recent echocardiogram had shown a preserved EF of 60-65% with moderate RV dilatation and severe pulmonary HTN. Was also diagnosed with atrial fibrillation during admission. She was started on Eliquis 5mg  BID for anticoagulation at the time of her visit and HR was well-controlled without the use of AV nodal blocking agents. She was continued on Lasix 40mg  daily with plans for repeat labs at follow-up.   In talking with the patient and her son today, she reports overall doing well. She is not very active at baseline and uses a walker for ambulation but reports her breathing has been stable. No recent orthopnea or PND. Weight has been stable at 173 - 174 lbs on her home scales (at 177 lbs on the office scales today). She still has mild edema but this has significantly improved as compared to the prior months. She denies any recent chest pain. Does report palpitations which can last for a few minutes then spontaneously resolve. Has not checked her vitals when this occurs. Consumes 1 soda daily and no coffee or tea.    Past Medical History:  Diagnosis Date  . Chronic pain   . Essential hypertension   . GERD (gastroesophageal reflux disease)   . Hyperlipidemia   . Hypothyroidism   . Obesity   . OSA (obstructive sleep apnea)    Unable to wear  CPAP  . Osteoarthritis   . PUD (peptic ulcer disease)   . Type 2 diabetes mellitus (HCC)     Past Surgical History:  Procedure Laterality Date  . TOTAL KNEE ARTHROPLASTY    . TUBAL LIGATION      Current Medications: Outpatient Medications Prior to Visit  Medication Sig Dispense Refill  . apixaban (ELIQUIS) 5 MG TABS tablet Take 1 tablet (5 mg total) by mouth 2 (two) times daily. 60 tablet 6  . citalopram (CELEXA) 20 MG tablet Take 10 mg by mouth daily.    . DULOXETINE HCL PO Take 20 mg by mouth in the morning and at bedtime.    . furosemide (LASIX) 40 MG tablet Take 40 mg by mouth daily.    gabapentin (NEURONTIN) 300 MG capsule Take 1 capsule by mouth 3 (three) times daily.     levothyroxine (SYNTHROID) 100 MCG tablet Take 100 mcg by mouth daily before breakfast.    . lisinopril (ZESTRIL) 5 MG tablet Take 5 mg by mouth daily.    . mirtazapine (REMERON) 15 MG tablet Take 15 mg by mouth at bedtime.    Marland Kitchen oxybutynin (DITROPAN XL) 15 MG 24 hr tablet Take 15 mg by mouth at bedtime.    . pantoprazole (PROTONIX) 40 MG tablet Take 40 mg by mouth daily.    . cephALEXin (KEFLEX) 500 MG capsule Take 500 mg by mouth  4 (four) times daily.     No facility-administered medications prior to visit.     Allergies:   Other and Zetia [ezetimibe]   Social History   Socioeconomic History  . Marital status: Widowed    Spouse name: Not on file  . Number of children: Not on file  . Years of education: Not on file  . Highest education level: Not on file  Occupational History  . Not on file  Tobacco Use  . Smoking status: Never Smoker  . Smokeless tobacco: Never Used  Substance and Sexual Activity  . Alcohol use: No  . Drug use: No  . Sexual activity: Not on file  Other Topics Concern  . Not on file  Social History Narrative  . Not on file   Social Determinants of Health   Financial Resource Strain: Not on file  Food Insecurity: Not on file  Transportation Needs: Not on file   Physical Activity: Not on file  Stress: Not on file  Social Connections: Not on file     Family History:  The patient's family history includes ALS in her brother; Arthritis in her brother; Diabetes in her sister; Heart disease in her brother; Hypertension in her brother.   Review of Systems:   Please see the history of present illness.     General:  No chills, fever, night sweats or weight changes.  Cardiovascular:  No chest pain, orthopnea, paroxysmal nocturnal dyspnea. Positive for edema (improved) and palpitations.  Dermatological: No rash, lesions/masses Respiratory: No cough, dyspnea Urologic: No hematuria, dysuria Abdominal:   No nausea, vomiting, diarrhea, bright red blood per rectum, melena, or hematemesis Neurologic:  No visual changes, wkns, changes in mental status. All other systems reviewed and are otherwise negative except as noted above.   Physical Exam:    VS:  BP 122/70   Pulse 72   Ht 5\' 2"  (1.575 m)   Wt 177 lb 4.8 oz (80.4 kg)   SpO2 94%   BMI 32.43 kg/m    General: Well developed, elderly female appearing in no acute distress. Head: Normocephalic, atraumatic. Neck: No carotid bruits. JVD not elevated.  Lungs: Respirations regular and unlabored, without wheezes or rales.  Heart: Irregularly irregular. No S3 or S4.  No murmur, no rubs, or gallops appreciated. Abdomen: Appears non-distended. No obvious abdominal masses. Msk:  Strength and tone appear normal for age. No obvious joint deformities or effusions. Extremities: No clubbing or cyanosis. Trace lower extremity edema bilaterally.  Distal pedal pulses are 2+ bilaterally. Neuro: Alert and oriented X 3. Moves all extremities spontaneously. No focal deficits noted. Psych:  Responds to questions appropriately with a normal affect. Skin: No rashes or lesions noted  Wt Readings from Last 3 Encounters:  10/31/20 177 lb 4.8 oz (80.4 kg)  09/17/20 177 lb (80.3 kg)  10/26/16 201 lb 4.5 oz (91.3 kg)      Studies/Labs Reviewed:   EKG:  EKG is not ordered today.   Recent Labs: No results found for requested labs within last 8760 hours.   Lipid Panel No results found for: CHOL, TRIG, HDL, CHOLHDL, VLDL, LDLCALC, LDLDIRECT  Additional studies/ records that were reviewed today include:   Echocardiogram: 06/2014 Study Conclusions   - Left ventricle: The cavity size was normal. Wall thickness was  normal. Systolic function was normal. The estimated ejection  fraction was in the range of 55% to 60%. Although no diagnostic  regional wall motion abnormality was identified, this possibility  cannot be completely excluded  on the basis of this study. Doppler  parameters are consistent with abnormal left ventricular  relaxation (grade 1 diastolic dysfunction).  - Mitral valve: Calcified annulus.  - Left atrium: The atrium was mildly dilated.  - Atrial septum: No defect or patent foramen ovale was identified.  - Pulmonary arteries: PA peak pressure: 32 mm Hg (S).    Echocardiogram: 06/2020 (Care Everywhere) Summary  1. Technically difficult study.  2. The left ventricle is normal in size with normal wall thickness.  3. The left ventricular systolic function is normal, LVEF is visually  estimated at 60-65%.  4. The left atrium is moderatelyto severely dilated in size.  5. The right ventricle is moderately dilated in size, with low normal  systolic function.  6. There is moderate tricuspid regurgitation.  7. There is severe pulmonary hypertension, estimated pulmonary artery  systolicpressure is 65 mmHg.  8. The right atrium is severely dilated in size.    Assessment:    1. Chronic diastolic heart failure (HCC)   2. Persistent atrial fibrillation (HCC)   3. Medication management   4. Essential hypertension      Plan:   In order of problems listed above:  1. Chronic Diastolic CHF - Her weight has been stable and she denies any orthopnea or PND.  Edema has also improved as compared to prior examinations per the patient's report. Will continue Lasix 40mg  daily for now. Recheck BMET with upcoming labs.  2. Persistent Atrial Fibrillation - She does report brief palpitations at times but HR has been well-controlled at recent office visits. She was provided with a BP/HR log and was encouraged to monitor this. Pending results, could consider initiation of a low-dose BB. The role of limiting caffeine intake was reviewed as well.  - She has been tolerating Eliquis well without any reported bleeding. Will recheck a CBC.   3. HTN - BP is well-controlled at 122/70 during today's visit. Continue current medication regimen with Lasix 40mg  daily and Lisinopril 5mg  daily.    Medication Adjustments/Labs and Tests Ordered: Current medicines are reviewed at length with the patient today.  Concerns regarding medicines are outlined above.  Medication changes, Labs and Tests ordered today are listed in the Patient Instructions below. Patient Instructions  Medication Instructions:  Your physician recommends that you continue on your current medications as directed. Please refer to the Current Medication list given to you today.  *If you need a refill on your cardiac medications before your next appointment, please call your pharmacy*   Lab Work: BMET CBC  If you have labs (blood work) drawn today and your tests are completely normal, you will receive your results only by: MyChart Message (if you have MyChart) OR . A paper copy in the mail If you have any lab test that is abnormal or we need to change your treatment, we will call you to review the results.   Testing/Procedures: None    Follow-Up: At [facility], you and your health needs are our priority.  As part of our continuing mission to provide you with exceptional heart care, we have created designated Provider Care Teams.  These Care Teams include your primary Cardiologist (physician)  and Advanced Practice Providers (APPs -  Physician Assistants and Nurse Practitioners) who all work together to provide you with the care you need, when you need it.  We recommend signing up for the patient portal called "MyChart".  Sign up information is provided on this After Visit Summary.  MyChart is  used to connect with patients for Virtual Visits (Telemedicine).  Patients are able to view lab/test results, encounter notes, upcoming appointments, etc.  Non-urgent messages can be sent to your provider as well.   To learn more about what you can do with MyChart, go to [email or web address].    Your next appointment:   6 month(s)  The format for your next appointment:   In Person  Provider:   You may see Nona Dell, MD or one of the following Advanced Practice Providers on your designated Care Team:    Randall An, PA-C     Other Instructions Please keep heart rate/blood pressure log and update [place] in TWO (2) weeks.         Signed, Ellsworth Lennox, PA-C  10/31/2020 7:03 PM    [facility] [address] [place], [place] [address] Phone: [phone] Fax: [phone]

## 2020-10-31 ENCOUNTER — Ambulatory Visit (INDEPENDENT_AMBULATORY_CARE_PROVIDER_SITE_OTHER): Payer: Medicare Other | Admitting: Student

## 2020-10-31 ENCOUNTER — Encounter: Payer: Self-pay | Admitting: Student

## 2020-10-31 ENCOUNTER — Other Ambulatory Visit: Payer: Self-pay

## 2020-10-31 VITALS — BP 122/70 | HR 72 | Ht 62.0 in | Wt 177.3 lb

## 2020-10-31 DIAGNOSIS — Z79899 Other long term (current) drug therapy: Secondary | ICD-10-CM | POA: Diagnosis not present

## 2020-10-31 DIAGNOSIS — I4819 Other persistent atrial fibrillation: Secondary | ICD-10-CM | POA: Diagnosis not present

## 2020-10-31 DIAGNOSIS — I10 Essential (primary) hypertension: Secondary | ICD-10-CM | POA: Diagnosis not present

## 2020-10-31 DIAGNOSIS — I5032 Chronic diastolic (congestive) heart failure: Secondary | ICD-10-CM

## 2020-10-31 NOTE — Patient Instructions (Signed)
Medication Instructions:  Your physician recommends that you continue on your current medications as directed. Please refer to the Current Medication list given to you today.  *If you need a refill on your cardiac medications before your next appointment, please call your pharmacy*   Lab Work: BMET CBC  If you have labs (blood work) drawn today and your tests are completely normal, you will receive your results only by: Marland Kitchen MyChart Message (if you have MyChart) OR . A paper copy in the mail If you have any lab test that is abnormal or we need to change your treatment, we will call you to review the results.   Testing/Procedures: None    Follow-Up: At [facility], you and your health needs are our priority.  As part of our continuing mission to provide you with exceptional heart care, we have created designated Provider Care Teams.  These Care Teams include your primary Cardiologist (physician) and Advanced Practice Providers (APPs -  Physician Assistants and Nurse Practitioners) who all work together to provide you with the care you need, when you need it.  We recommend signing up for the patient portal called "MyChart".  Sign up information is provided on this After Visit Summary.  MyChart is used to connect with patients for Virtual Visits (Telemedicine).  Patients are able to view lab/test results, encounter notes, upcoming appointments, etc.  Non-urgent messages can be sent to your provider as well.   To learn more about what you can do with MyChart, go to [email or web address].    Your next appointment:   6 month(s)  The format for your next appointment:   In Person  Provider:   You may see Nona Dell, MD or one of the following Advanced Practice Providers on your designated Care Team:    Randall An, PA-C     Other Instructions Please keep heart rate/blood pressure log and update [place] in TWO (2) weeks.

## 2020-11-01 DIAGNOSIS — I5033 Acute on chronic diastolic (congestive) heart failure: Secondary | ICD-10-CM | POA: Diagnosis not present

## 2020-11-01 DIAGNOSIS — R2689 Other abnormalities of gait and mobility: Secondary | ICD-10-CM | POA: Diagnosis not present

## 2020-11-01 DIAGNOSIS — G609 Hereditary and idiopathic neuropathy, unspecified: Secondary | ICD-10-CM | POA: Diagnosis not present

## 2020-11-01 DIAGNOSIS — I272 Pulmonary hypertension, unspecified: Secondary | ICD-10-CM | POA: Diagnosis not present

## 2020-11-01 DIAGNOSIS — I110 Hypertensive heart disease with heart failure: Secondary | ICD-10-CM | POA: Diagnosis not present

## 2020-11-01 DIAGNOSIS — J9601 Acute respiratory failure with hypoxia: Secondary | ICD-10-CM | POA: Diagnosis not present

## 2020-11-02 ENCOUNTER — Telehealth: Payer: Self-pay | Admitting: Student

## 2020-11-02 DIAGNOSIS — I272 Pulmonary hypertension, unspecified: Secondary | ICD-10-CM | POA: Diagnosis not present

## 2020-11-02 DIAGNOSIS — G609 Hereditary and idiopathic neuropathy, unspecified: Secondary | ICD-10-CM | POA: Diagnosis not present

## 2020-11-02 DIAGNOSIS — I110 Hypertensive heart disease with heart failure: Secondary | ICD-10-CM | POA: Diagnosis not present

## 2020-11-02 DIAGNOSIS — J9601 Acute respiratory failure with hypoxia: Secondary | ICD-10-CM | POA: Diagnosis not present

## 2020-11-02 DIAGNOSIS — R2689 Other abnormalities of gait and mobility: Secondary | ICD-10-CM | POA: Diagnosis not present

## 2020-11-02 DIAGNOSIS — I5033 Acute on chronic diastolic (congestive) heart failure: Secondary | ICD-10-CM | POA: Diagnosis not present

## 2020-11-02 NOTE — Telephone Encounter (Signed)
New message   Marisue Ivan with [facility] called   Pt c/o swelling: STAT is pt has developed SOB within 24 hours  1) How much weight have you gained and in what time span? 1 day  2) If swelling, where is the swelling located? All over   3) Are you currently taking a fluid pill? Yes 40 mg of lasik  4) Are you currently SOB? no  5) Do you have a log of your daily weights (if so, list)?  177 today  174 yesterday 173 Wednesday   6) Have you gained 3 pounds in a day or 5 pounds in a week? Yes   7) Have you traveled recently?  No   Per Marisue Ivan patient ate a can of [employer] and she believes that is was is causing the swelling/weight gain

## 2020-11-02 NOTE — Telephone Encounter (Signed)
I spoke with Christina Frey, she will instruct patient regarding Lasix dosing and they will cal back next week if weight remains above baseline.

## 2020-11-02 NOTE — Telephone Encounter (Signed)
I will forward to B.Strader, PA-C 

## 2020-11-02 NOTE — Telephone Encounter (Signed)
    I would recommend she double up on her Lasix for the next two days and take 40mg  BID then resume her prior dosing of 40mg  daily. They should reach out early next week if weight has not returned to baseline.   Signed, , PA-C 11/02/2020, 10:36 AM Pager: [phone]

## 2020-11-14 ENCOUNTER — Other Ambulatory Visit: Payer: Self-pay

## 2020-11-14 ENCOUNTER — Other Ambulatory Visit (HOSPITAL_COMMUNITY)
Admission: RE | Admit: 2020-11-14 | Discharge: 2020-11-14 | Disposition: A | Discharge status: Home / Self Care | Payer: Medicare Other | Source: Ambulatory Visit | Attending: Student | Admitting: Student

## 2020-11-14 DIAGNOSIS — Z79899 Other long term (current) drug therapy: Secondary | ICD-10-CM | POA: Diagnosis not present

## 2020-11-14 LAB — BASIC METABOLIC PANEL
Anion gap: 9 (ref 5–15)
BUN: 11 mg/dL (ref 8–23)
CO2: 29 mmol/L (ref 22–32)
Calcium: 8.7 mg/dL — ABNORMAL LOW (ref 8.9–10.3)
Chloride: 99 mmol/L (ref 98–111)
Creatinine, Ser: 0.72 mg/dL (ref 0.44–1.00)
GFR, Estimated: >60 mL/min (ref >60)
Glucose, Bld: 95 mg/dL (ref 70–99)
Potassium: 3.1 mmol/L — ABNORMAL LOW (ref 3.5–5.1)
Sodium: 137 mmol/L (ref 135–145)

## 2020-11-14 LAB — CBC
HCT: 37.9 % (ref 36.0–46.0)
Hemoglobin: 11.6 g/dL — ABNORMAL LOW (ref 12.0–15.0)
MCH: 29.1 pg (ref 26.0–34.0)
MCHC: 30.6 g/dL (ref 30.0–36.0)
MCV: 95 fL (ref 80.0–100.0)
Platelets: 184 10*3/uL (ref 150–400)
RBC: 3.99 MIL/uL (ref 3.87–5.11)
RDW: 15.4 % (ref 11.5–15.5)
WBC: 7.1 10*3/uL (ref 4.0–10.5)
nRBC: 0 % (ref 0.0–0.2)

## 2020-11-15 ENCOUNTER — Telehealth: Payer: Self-pay | Admitting: *Deleted

## 2020-11-15 DIAGNOSIS — Z79899 Other long term (current) drug therapy: Secondary | ICD-10-CM

## 2020-11-15 MED ORDER — POTASSIUM CHLORIDE CRYS ER 20 MEQ PO TBCR: 20.0000 meq | EXTENDED_RELEASE_TABLET | Freq: Every day | ORAL | 3 refills | Status: DC

## 2020-11-15 NOTE — Telephone Encounter (Signed)
-----   Message from Ellsworth Lennox, [place] sent at 11/15/2020 11:44 AM EST ----- Please let the patient know her lab work shows her Hgb is stable at 11.6 and platelets are within a normal range. Kidney function remains stable but K+ is low at 3.1. Would start K-dur 20 mEq daily. Repeat BMET again in 2-3 weeks.

## 2020-11-15 NOTE — Telephone Encounter (Signed)
Pt notified and orders placed  

## 2021-02-19 DIAGNOSIS — M1712 Unilateral primary osteoarthritis, left knee: Secondary | ICD-10-CM | POA: Diagnosis not present

## 2021-02-19 DIAGNOSIS — H25011 Cortical age-related cataract, right eye: Secondary | ICD-10-CM | POA: Diagnosis not present

## 2021-02-19 DIAGNOSIS — Z961 Presence of intraocular lens: Secondary | ICD-10-CM | POA: Diagnosis not present

## 2021-02-19 DIAGNOSIS — H2511 Age-related nuclear cataract, right eye: Secondary | ICD-10-CM | POA: Diagnosis not present

## 2021-02-19 DIAGNOSIS — M79641 Pain in right hand: Secondary | ICD-10-CM | POA: Diagnosis not present

## 2021-02-19 DIAGNOSIS — H401122 Primary open-angle glaucoma, left eye, moderate stage: Secondary | ICD-10-CM | POA: Diagnosis not present

## 2021-02-19 DIAGNOSIS — M25562 Pain in left knee: Secondary | ICD-10-CM | POA: Diagnosis not present

## 2021-03-08 DIAGNOSIS — Z6835 Body mass index (BMI) 35.0-35.9, adult: Secondary | ICD-10-CM | POA: Diagnosis not present

## 2021-03-08 DIAGNOSIS — L281 Prurigo nodularis: Secondary | ICD-10-CM | POA: Diagnosis not present

## 2021-03-08 DIAGNOSIS — I5030 Unspecified diastolic (congestive) heart failure: Secondary | ICD-10-CM | POA: Diagnosis not present

## 2021-03-08 DIAGNOSIS — E6609 Other obesity due to excess calories: Secondary | ICD-10-CM | POA: Diagnosis not present

## 2021-03-08 DIAGNOSIS — I4891 Unspecified atrial fibrillation: Secondary | ICD-10-CM | POA: Diagnosis not present

## 2021-03-08 DIAGNOSIS — E039 Hypothyroidism, unspecified: Secondary | ICD-10-CM | POA: Diagnosis not present

## 2021-03-08 DIAGNOSIS — E7849 Other hyperlipidemia: Secondary | ICD-10-CM | POA: Diagnosis not present

## 2021-03-08 DIAGNOSIS — R600 Localized edema: Secondary | ICD-10-CM | POA: Diagnosis not present

## 2021-03-08 DIAGNOSIS — I10 Essential (primary) hypertension: Secondary | ICD-10-CM | POA: Diagnosis not present

## 2021-03-08 DIAGNOSIS — G894 Chronic pain syndrome: Secondary | ICD-10-CM | POA: Diagnosis not present

## 2021-03-08 DIAGNOSIS — E1129 Type 2 diabetes mellitus with other diabetic kidney complication: Secondary | ICD-10-CM | POA: Diagnosis not present

## 2021-03-26 DIAGNOSIS — Z20822 Contact with and (suspected) exposure to covid-19: Secondary | ICD-10-CM | POA: Diagnosis not present

## 2021-04-03 ENCOUNTER — Other Ambulatory Visit: Payer: Self-pay | Admitting: Cardiology

## 2021-04-04 NOTE — Telephone Encounter (Signed)
Prescription refill request for Eliquis received. Indication:  Atrial fib Last office visit: 10/31/20  B Strader PA-C Scr: 0.72  on 11/14/20 Age: 80 Weight: 80.4kg  Based on above findings Eliquis 5mg  twice daily is the appropriate dose.  Refill approved.

## 2021-05-01 DIAGNOSIS — M79641 Pain in right hand: Secondary | ICD-10-CM | POA: Diagnosis not present

## 2021-05-01 DIAGNOSIS — M720 Palmar fascial fibromatosis [Dupuytren]: Secondary | ICD-10-CM | POA: Diagnosis not present

## 2021-05-03 DIAGNOSIS — E039 Hypothyroidism, unspecified: Secondary | ICD-10-CM | POA: Diagnosis not present

## 2021-05-03 DIAGNOSIS — E782 Mixed hyperlipidemia: Secondary | ICD-10-CM | POA: Diagnosis not present

## 2021-05-03 DIAGNOSIS — F419 Anxiety disorder, unspecified: Secondary | ICD-10-CM | POA: Diagnosis not present

## 2021-05-03 DIAGNOSIS — G894 Chronic pain syndrome: Secondary | ICD-10-CM | POA: Diagnosis not present

## 2021-05-03 DIAGNOSIS — I10 Essential (primary) hypertension: Secondary | ICD-10-CM | POA: Diagnosis not present

## 2021-05-03 DIAGNOSIS — Z1389 Encounter for screening for other disorder: Secondary | ICD-10-CM | POA: Diagnosis not present

## 2021-05-03 DIAGNOSIS — E1129 Type 2 diabetes mellitus with other diabetic kidney complication: Secondary | ICD-10-CM | POA: Diagnosis not present

## 2021-05-03 DIAGNOSIS — I4891 Unspecified atrial fibrillation: Secondary | ICD-10-CM | POA: Diagnosis not present

## 2021-05-03 DIAGNOSIS — Z0001 Encounter for general adult medical examination with abnormal findings: Secondary | ICD-10-CM | POA: Diagnosis not present

## 2021-05-03 DIAGNOSIS — I5030 Unspecified diastolic (congestive) heart failure: Secondary | ICD-10-CM | POA: Diagnosis not present

## 2021-05-03 DIAGNOSIS — Z6837 Body mass index (BMI) 37.0-37.9, adult: Secondary | ICD-10-CM | POA: Diagnosis not present

## 2021-05-03 DIAGNOSIS — Z1331 Encounter for screening for depression: Secondary | ICD-10-CM | POA: Diagnosis not present

## 2021-05-08 ENCOUNTER — Other Ambulatory Visit (HOSPITAL_COMMUNITY): Payer: Self-pay | Admitting: Family Medicine

## 2021-05-08 DIAGNOSIS — Z780 Asymptomatic menopausal state: Secondary | ICD-10-CM

## 2021-05-08 DIAGNOSIS — Z13820 Encounter for screening for osteoporosis: Secondary | ICD-10-CM

## 2021-05-19 NOTE — Progress Notes (Signed)
Cardiology Office Note  Date: 05/20/2021   ID: Christina Frey, DOB February 10, 1941, MRN [id number]  PCP:  Elfredia Nevins, MD  Cardiologist:  Nona Dell, MD Electrophysiologist:  None   Chief Complaint  Patient presents with   Cardiac follow-up    History of Present Illness: Christina Frey is an 79 y.o. female last seen in February by Ms. Strader PA-C.  She is here today with her son for a follow-up visit.  She has experienced a 20 pound weight gain since we last saw her, admits that she has not been weighing herself since March, uncertain about how quickly this fluid gain occurred.  She reports compliance with Lasix 40 mg daily and potassium supplement.  She has had leg swelling, also NYHA class II-III dyspnea.  No sense of palpitations or chest pain.  She reportedly was seen at [facility] recently and had lab work which we are requesting for review.  Her last echocardiogram was done at [facility] back in October 2021 as outlined below.  We discussed getting an updated study.  We also went over her medications and discussed adjustments.  Past Medical History:  Diagnosis Date   Chronic pain    Essential hypertension    GERD (gastroesophageal reflux disease)    Hyperlipidemia    Hypothyroidism    Obesity    OSA (obstructive sleep apnea)    Unable to wear CPAP   Osteoarthritis    PUD (peptic ulcer disease)    Type 2 diabetes mellitus (HCC)     Past Surgical History:  Procedure Laterality Date   TOTAL KNEE ARTHROPLASTY     TUBAL LIGATION      Current Outpatient Medications  Medication Sig Dispense Refill   citalopram (CELEXA) 20 MG tablet Take 10 mg by mouth daily.     DULoxetine (CYMBALTA) 30 MG capsule Take 30 mg by mouth 2 (two) times daily.     ELIQUIS 5 MG TABS tablet TAKE 1 TABLET BY MOUTH TWICE A DAY 60 tablet 6   furosemide (LASIX) 40 MG tablet Take 1 tablet (40 mg total) by mouth 2 (two) times daily. 180 tablet 3   gabapentin (NEURONTIN) 300 MG capsule Take 1  capsule by mouth 3 (three) times daily.      latanoprost (XALATAN) 0.005 % ophthalmic solution Place 1 drop into the left eye at bedtime.     levothyroxine (SYNTHROID) 100 MCG tablet Take 100 mcg by mouth daily before breakfast.     lisinopril (ZESTRIL) 5 MG tablet Take 5 mg by mouth daily.     mirtazapine (REMERON) 15 MG tablet Take 15 mg by mouth at bedtime.     oxybutynin (DITROPAN XL) 15 MG 24 hr tablet Take 15 mg by mouth at bedtime.     pantoprazole (PROTONIX) 40 MG tablet Take 40 mg by mouth daily.     potassium chloride SA (KLOR-CON) 20 MEQ tablet Take 1 tablet (20 mEq total) by mouth daily. 90 tablet 3   traMADol (ULTRAM) 50 MG tablet Take 50 mg by mouth as needed.     No current facility-administered medications for this visit.   Allergies:  Other and Zetia [ezetimibe]   ROS: No falls or syncope, uses a walker.  Physical Exam: VS:  BP 124/70   Pulse 94   Ht 5\' 2"  (1.575 m)   Wt 195 lb (88.5 kg)   SpO2 95%   BMI 35.67 kg/m , BMI Body mass index is 35.67 kg/m.  Wt Readings from  Last 3 Encounters:  05/20/21 195 lb (88.5 kg)  10/31/20 177 lb 4.8 oz (80.4 kg)  09/17/20 177 lb (80.3 kg)    General: Elderly woman, no distress. HEENT: Conjunctiva and lids normal, wearing a mask. Neck: Supple, no carotid bruits. Lungs: Decreased breath sounds at the bases bilaterally, nonlabored breathing at rest. Cardiac: Irregularly irregular, no S3, 3/6 left basal systolic murmur, no pericardial rub. Abdomen: Protuberant, bowel sounds present. Extremities: 2-3+ leg edema and erythema.  ECG:  An ECG dated September 2021 was personally reviewed today and demonstrated:  Probable atrial fibrillation with nonspecific ST changes and intermittent PVCs.  Recent Labwork: 11/14/2020: BUN 11; Creatinine, Ser 0.72; Hemoglobin 11.6; Platelets 184; Potassium 3.1; Sodium 137   Other Studies Reviewed Today:  Echocardiogram 06/26/2020 [facility]): Summary    1. Technically difficult study.     2. The left ventricle is normal in size with normal wall thickness.    3. The left ventricular systolic function is normal, LVEF is visually  estimated at 60-65%.    4. The left atrium is moderately to severely dilated in size.    5. The right ventricle is moderately dilated in size, with low normal  systolic function.    6. There is moderate tricuspid regurgitation.    7. There is severe pulmonary hypertension, estimated pulmonary artery  systolic pressure is 65 mmHg.    8. The right atrium is severely dilated  in size.   Assessment and Plan:  1.  Acute on chronic diastolic heart failure with 20 pound weight gain since earlier this year.  Plan to request recent lab work from [place].  Increase Lasix to 40 mg twice daily and KCl to 20 mEq twice daily.  Follow-up echocardiogram for reassessment of LVEF, also degree of pulmonary hypertension.  Follow-up arranged within the next 2 weeks with BMET.  2.  Persistent atrial fibrillation CHA2DS2-VASc score of at least 6.  She continues on Eliquis for stroke prophylaxis.  Adding Coreg 3.125 mg twice daily as well.  Medication Adjustments/Labs and Tests Ordered: Current medicines are reviewed at length with the patient today.  Concerns regarding medicines are outlined above.   Tests Ordered: Orders Placed This Encounter  Procedures   Basic metabolic panel   ECHOCARDIOGRAM COMPLETE     Medication Changes: Meds ordered this encounter  Medications   furosemide (LASIX) 40 MG tablet    Sig: Take 1 tablet (40 mg total) by mouth 2 (two) times daily.    Dispense:  180 tablet    Refill:  3     Disposition:  Follow up  2 weeks.  Signed, Jonelle Sidle, MD, [facility] 05/20/2021 1:50 PM    [facility] at [facility] [address], [place], [place] [address] Phone: [phone]; Fax: [phone]

## 2021-05-20 ENCOUNTER — Other Ambulatory Visit: Payer: Self-pay

## 2021-05-20 ENCOUNTER — Encounter: Payer: Self-pay | Admitting: Cardiology

## 2021-05-20 ENCOUNTER — Ambulatory Visit (INDEPENDENT_AMBULATORY_CARE_PROVIDER_SITE_OTHER): Payer: Medicare Other | Admitting: Cardiology

## 2021-05-20 ENCOUNTER — Encounter: Payer: Self-pay | Admitting: *Deleted

## 2021-05-20 VITALS — BP 124/70 | HR 94 | Ht 62.0 in | Wt 195.0 lb

## 2021-05-20 DIAGNOSIS — I5032 Chronic diastolic (congestive) heart failure: Secondary | ICD-10-CM | POA: Diagnosis not present

## 2021-05-20 DIAGNOSIS — I5033 Acute on chronic diastolic (congestive) heart failure: Secondary | ICD-10-CM | POA: Diagnosis not present

## 2021-05-20 DIAGNOSIS — I4819 Other persistent atrial fibrillation: Secondary | ICD-10-CM | POA: Diagnosis not present

## 2021-05-20 MED ORDER — FUROSEMIDE 40 MG PO TABS: 40.0000 mg | ORAL_TABLET | Freq: Two times a day (BID) | ORAL | 3 refills | Status: DC

## 2021-05-20 MED ORDER — CARVEDILOL 3.125 MG PO TABS: 3.1250 mg | ORAL_TABLET | Freq: Two times a day (BID) | ORAL | 3 refills | Status: DC

## 2021-05-20 NOTE — Patient Instructions (Signed)
Medication Instructions:   Increase Lasix to 40 mg Two Times Daily  Start Coreg 3.125 mg Two Times Daily   *If you need a refill on your cardiac medications before your next appointment, please call your pharmacy*   Lab Work: Your physician recommends that you return for lab work in: Just before your next visit ( 2 Weeks)   If you have labs (blood work) drawn today and your tests are completely normal, you will receive your results only by: MyChart Message (if you have MyChart) OR A paper copy in the mail If you have any lab test that is abnormal or we need to change your treatment, we will call you to review the results.   Testing/Procedures: Your physician has requested that you have an echocardiogram. Echocardiography is a painless test that uses sound waves to create images of your heart. It provides your doctor with information about the size and shape of your heart and how well your heart's chambers and valves are working. This procedure takes approximately one hour. There are no restrictions for this procedure.    Follow-Up: At [facility], you and your health needs are our priority.  As part of our continuing mission to provide you with exceptional heart care, we have created designated Provider Care Teams.  These Care Teams include your primary Cardiologist (physician) and Advanced Practice Providers (APPs -  Physician Assistants and Nurse Practitioners) who all work together to provide you with the care you need, when you need it.  We recommend signing up for the patient portal called "MyChart".  Sign up information is provided on this After Visit Summary.  MyChart is used to connect with patients for Virtual Visits (Telemedicine).  Patients are able to view lab/test results, encounter notes, upcoming appointments, etc.  Non-urgent messages can be sent to your provider as well.   To learn more about what you can do with MyChart, go to [email or web address].    Your next  appointment:   2 week(s)  The format for your next appointment:   In Person  Provider:   You will see one of the following Advanced Practice Providers on your designated Care Team:   Randall An, PA-C  Jacolyn Reedy, PA-C    Other Instructions Thank you for choosing [facility]!

## 2021-05-21 ENCOUNTER — Ambulatory Visit (HOSPITAL_COMMUNITY)
Admission: RE | Admit: 2021-05-21 | Discharge: 2021-05-21 | Disposition: A | Discharge status: Home / Self Care | Payer: Medicare Other | Source: Ambulatory Visit | Attending: Family Medicine | Admitting: Family Medicine

## 2021-05-21 DIAGNOSIS — Z780 Asymptomatic menopausal state: Secondary | ICD-10-CM | POA: Insufficient documentation

## 2021-05-21 DIAGNOSIS — M85851 Other specified disorders of bone density and structure, right thigh: Secondary | ICD-10-CM | POA: Diagnosis not present

## 2021-05-21 DIAGNOSIS — M85832 Other specified disorders of bone density and structure, left forearm: Secondary | ICD-10-CM | POA: Diagnosis not present

## 2021-06-11 ENCOUNTER — Other Ambulatory Visit: Payer: Self-pay

## 2021-06-11 ENCOUNTER — Ambulatory Visit (HOSPITAL_COMMUNITY)
Admission: RE | Admit: 2021-06-11 | Discharge: 2021-06-11 | Disposition: A | Discharge status: Home / Self Care | Payer: Medicare Other | Source: Ambulatory Visit | Attending: Cardiology | Admitting: Cardiology

## 2021-06-11 DIAGNOSIS — I5032 Chronic diastolic (congestive) heart failure: Secondary | ICD-10-CM | POA: Diagnosis not present

## 2021-06-11 LAB — ECHOCARDIOGRAM COMPLETE
AR max vel: 1.86 cm2
AV Area VTI: 2.19 cm2
AV Area mean vel: 1.94 cm2
AV Mean grad: 3 mmHg
AV Peak grad: 8 mmHg
Ao pk vel: 1.41 m/s
Area-P 1/2: 3.5 cm2
MV VTI: 1.6 cm2
S' Lateral: 2.9 cm

## 2021-06-11 NOTE — Progress Notes (Signed)
*  PRELIMINARY RESULTS* Echocardiogram 2D Echocardiogram has been performed.  Carolyne Fiscal 06/11/2021, 4:03 PM

## 2021-06-17 ENCOUNTER — Encounter: Payer: Self-pay | Admitting: Physician Assistant

## 2021-06-17 NOTE — Progress Notes (Signed)
Cardiology Office Note    Date:  06/18/2021   ID:  Christina Frey, DOB 1940-12-23, MRN [id number]  PCP:  Redmond School, MD  Cardiologist:  Rozann Lesches, MD  Electrophysiologist:  None   Chief Complaint: f/u CHF  History of Present Illness:   Christina Frey is a 80 y.o. female with history of persistent atrial fibrillation, essential HTN, chronic pain, GERD, HLD, hypothyroidism, prior OSA intolerant to CPAP, OA, DM2, mild anemia by labs, PUD, chronic diastolic CHF/RV dysfunction, ASD, and pulm HTN who presents for follow-up.   Prior nuc stress test and LE arterial duplex in 2007 were normal. She was hospitalized at [facility] in 06/2020 with a fall, elevated CPK, and hypoxia. 2D echo had shown normal EF and severe pulmonary HTN. She hd another admission there in 08/2020 for hypoxia felt due to CHF. CTA chest 08/2020 showed "upper lobe predominant asymmetric ground-glass pulmonary infiltrate likely representative of atypical infection in the appropriate clinical setting. Mild to moderate global cardiomegaly with morphologic changes in keeping with pulmonary arterial hypertension and right heart failure. Small bilateral pleural effusions with associated bibasilar atelectasis. No pulmonary embolism."  In 09/2020, she was found to have atrial fibrillation of unclear duration but suspected longstanding. She was managed with rate control strategy and initiation of Eliquis. She was recently seen in the office 05/2021 with 20lb weight gain sometime gradually from 11/2020 onward. Her Lasix was increased to 61m BID and low dose carvedilol was added. F/u 2D echo 06/11/21 EF 65-70%, indeterminate diastolic parameters, interventricular septum is flattened in systole and diastole, consistent with right ventricular pressure and volume overload, RVF mildly reduced and RV moderately enlarged, moderately elevated PASP, severe biatrial enlargement, small pericardial effusion, mild-moderate MR, TR, and aortic  sclerosis without stenosis, dilated IVC, and moderate sized secundum ASD with L-R shunting.  She presents back for follow-up feeling about the same. Weight has not really budged at home, staying between 193-196 at home. She is up 4 lbs by our scale. Regarding baseline weight, she reports she was around 177 in 11/2020. She stopped following it once it started to increase. She complains of generalized fatigue and decreased endurance. No overt orthopnea, but does get winded with activities. No CP or palpitations. She reports edema is about the same, though not nearly as bad as it was in 08/2020.  Labwork independently reviewed: 04/2021 scan Hgb 11.2, plt 253, BUN 12, Cr 0.85, K 4.1, albumin 4.2, alk phos 138, AST/ALT wnl, LDL 66, TSH wnl  Past Medical History:  Diagnosis Date   ASD (atrial septal defect)    Chronic diastolic CHF (congestive heart failure) (HCC)    Chronic pain    Essential hypertension    GERD (gastroesophageal reflux disease)    Hyperlipidemia    Hypothyroidism    Mitral regurgitation    Obesity    OSA (obstructive sleep apnea)    Unable to wear CPAP   Osteoarthritis    Persistent atrial fibrillation (HCC)    PUD (peptic ulcer disease)    Pulmonary hypertension (HCC)    Tricuspid regurgitation    Type 2 diabetes mellitus ([facility] [place]     Past Surgical History:  Procedure Laterality Date   TOTAL KNEE ARTHROPLASTY     TUBAL LIGATION      Current Medications: Current Meds  Medication Sig   carvedilol (COREG) 3.125 MG tablet Take 1 tablet (3.125 mg total) by mouth 2 (two) times daily.   citalopram (CELEXA) 20 MG tablet Take 10 mg by mouth  daily.   DULoxetine (CYMBALTA) 30 MG capsule Take 30 mg by mouth 2 (two) times daily.   ELIQUIS 5 MG TABS tablet TAKE 1 TABLET BY MOUTH TWICE A DAY   furosemide (LASIX) 40 MG tablet Take 1 tablet (40 mg total) by mouth 2 (two) times daily.   gabapentin (NEURONTIN) 300 MG capsule Take 1 capsule by mouth 3 (three) times daily.     latanoprost (XALATAN) 0.005 % ophthalmic solution Place 1 drop into the left eye at bedtime.   levothyroxine (SYNTHROID) 100 MCG tablet Take 100 mcg by mouth daily before breakfast.   lisinopril (ZESTRIL) 5 MG tablet Take 5 mg by mouth daily.   mirtazapine (REMERON) 15 MG tablet Take 15 mg by mouth at bedtime.   oxybutynin (DITROPAN XL) 15 MG 24 hr tablet Take 15 mg by mouth at bedtime.   pantoprazole (PROTONIX) 40 MG tablet Take 40 mg by mouth daily.   potassium chloride SA (KLOR-CON) 20 MEQ tablet Take 1 tablet (20 mEq total) by mouth daily.   traMADol (ULTRAM) 50 MG tablet Take 50 mg by mouth as needed.     Allergies:   Other and Zetia [ezetimibe]   Social History   Socioeconomic History   Marital status: Widowed    Spouse name: Not on file   Number of children: Not on file   Years of education: Not on file   Highest education level: Not on file  Occupational History   Not on file  Tobacco Use   Smoking status: Never   Smokeless tobacco: Never  Substance and Sexual Activity   Alcohol use: No   Drug use: No   Sexual activity: Not on file  Other Topics Concern   Not on file  Social History Narrative   Not on file   Social Determinants of Health   Financial Resource Strain: Not on file  Food Insecurity: Not on file  Transportation Needs: Not on file  Physical Activity: Not on file  Stress: Not on file  Social Connections: Not on file     Family History:  The patient's family history includes ALS in her brother; Arthritis in her brother; Diabetes in her sister; Heart disease in her brother; Hypertension in her brother.  ROS:   Please see the history of present illness.  All other systems are reviewed and otherwise negative.    EKGs/Labs/Other Studies Reviewed:    Studies reviewed are outlined and summarized above. Reports included below if pertinent.  2D echo 06/11/21  IMPRESSIONS     1. Left ventricular ejection fraction, by estimation, is 65 to 70%. The   left ventricle has normal function. The left ventricle has no regional  wall motion abnormalities. Left ventricular diastolic parameters are  indeterminate. There is the  interventricular septum is flattened in systole and diastole, consistent  with right ventricular pressure and volume overload.   2. Right ventricular systolic function is mildly reduced. The right  ventricular size is moderately enlarged. There is moderately elevated  pulmonary artery systolic pressure. The estimated right ventricular  systolic pressure is 30.1 mmHg.   3. Left atrial size was severely dilated.   4. Right atrial size was severely dilated.   5. A small pericardial effusion is present. The pericardial effusion is  posterior to the left ventricle.   6. The mitral valve is abnormal. Mild mitral valve regurgitation.  Moderate mitral annular calcification.   7. Tricuspid valve regurgitation is mild to moderate.   8. The aortic valve is tricuspid.  Aortic valve regurgitation is not  visualized. Mild to moderate aortic valve sclerosis/calcification is  present, without any evidence of aortic stenosis. Aortic valve mean  gradient measures 3.0 mmHg.   9. The inferior vena cava is dilated in size with >50% respiratory  variability, suggesting right atrial pressure of 8 mmHg.  10. Evidence of atrial level shunting detected by color flow Doppler.  There is a moderately sized secundum atrial septal defect with  predominantly left to right shunting across the atrial septum.   Comparison(s): Prior images unable to be directly viewed.     EKG:  EKG is personally reviewed today showing atrial fib 79bpm with occasional PVCs, possible RVH, generally low voltage, possible prior lateral infarct, similar to prior   Recent Labs: 11/14/2020: BUN 11; Creatinine, Ser 0.72; Hemoglobin 11.6; Platelets 184; Potassium 3.1; Sodium 137  Recent Lipid Panel No results found for: CHOL, TRIG, HDL, CHOLHDL, VLDL, LDLCALC,  LDLDIRECT  PHYSICAL EXAM:    VS:  BP 124/80   Pulse 60   Ht 5' 2"  (1.575 m)   Wt 199 lb (90.3 kg)   BMI 36.40 kg/m   BMI: Body mass index is 36.4 kg/m.  GEN: Well nourished, well developed female in no acute distress HEENT: normocephalic, atraumatic Neck: no JVD, carotid bruits, or masses Cardiac: irregularly irregular; 3/6 SEM LSB, no rubs or gallops, 1-2+ BLE edema with mild erythema, no demarcations Respiratory:  clear to auscultation bilaterally, normal work of breathing GI: soft, nontender, nondistended, + BS MS: no deformity or atrophy Skin: warm and dry, no rash Neuro:  Alert and Oriented x 3, Strength and sensation are intact, follows commands Psych: euthymic mood, full affect  Wt Readings from Last 3 Encounters:  06/18/21 199 lb (90.3 kg)  05/20/21 195 lb (88.5 kg)  10/31/20 177 lb 4.8 oz (80.4 kg)     ASSESSMENT & PLAN:   1. Acute on chronic diastolic CHF / right heart failure - she has not really seen any changes in her weight or symptoms since increasing Lasix. I will follow up basic labs today before making further decisions on her diuretic adjustment. If creatinine is stable I would consider changing Lasix to torsemide for increased bioavailability. She is not thrilled with the idea of trying to increase the Lasix dose. I did tell her that if she does not begin to move in the right direction, would have low threshold to present to the hospital for IV diuresis. We also discussed that sometimes development of right heart failure and pulmonary HTN can be a result of untreated sleep apnea. We will place a referral to [facility] pulmonology to revisit sleep study.  2. Moderate pulmonary HTN - question multifactorial in setting of CHF, OSA. Will continue to work on diuresis above and also refer to [facility] to consider sleep study / further eval. CTA 2021 reviewed as above. Consideration can be given to right heart cath when she's more euvolemic to reassess whether  there are additional factors at play.  3. Valvular heart disease with mild-moderate MR/TR - continue to follow clinically for now.  4. Persistent atrial fibrillation - primary cardiologist has recommended rate control strategy. Her rate is controlled. Will continue Eliquis. We will f/u a BMET/CBC today to ensure labs are stable.  5. ASD - per Dr. Domenic Polite on recent result note, " ASD also noted, but this would be managed conservatively at this point.  She is already anticoagulated which should reduce stroke risk."  6. PVCs - noted on EKG  this visit and prior tracing in January. Will check BMET/Mg/TSH with labs above. Upon further review post-visit, it seems worthwhile to pursue a 3 day Zio to quantify her burden in case this is contributing to symptoms. When we call her with labs, we'll let her know this plan. Continue carvedilol in the interim.  Disposition: F/u with Dr. Domenic Polite or APP in 2 weeks. Low threshold to send to hospital if symptoms do not begin to improve.   Medication Adjustments/Labs and Tests Ordered: Current medicines are reviewed at length with the patient today.  Concerns regarding medicines are outlined above. Medication changes, Labs and Tests ordered today are summarized above and listed in the Patient Instructions accessible in Encounters.    Signed, Charlie Pitter, PA-C  06/18/2021 2:42 PM    [facility] Location in [place]. [place], [place] [address] Ph: [phone]; Fax [phone]

## 2021-06-18 ENCOUNTER — Other Ambulatory Visit (HOSPITAL_COMMUNITY)
Admission: RE | Admit: 2021-06-18 | Discharge: 2021-06-18 | Disposition: A | Discharge status: Home / Self Care | Payer: Medicare Other | Source: Ambulatory Visit | Attending: Physician Assistant | Admitting: Physician Assistant

## 2021-06-18 ENCOUNTER — Other Ambulatory Visit: Payer: Self-pay

## 2021-06-18 ENCOUNTER — Ambulatory Visit (INDEPENDENT_AMBULATORY_CARE_PROVIDER_SITE_OTHER): Payer: Medicare Other | Admitting: Physician Assistant

## 2021-06-18 ENCOUNTER — Encounter: Payer: Self-pay | Admitting: Physician Assistant

## 2021-06-18 VITALS — BP 124/80 | HR 60 | Ht 62.0 in | Wt 199.0 lb

## 2021-06-18 DIAGNOSIS — I272 Pulmonary hypertension, unspecified: Secondary | ICD-10-CM

## 2021-06-18 DIAGNOSIS — Q211 Atrial septal defect, unspecified: Secondary | ICD-10-CM | POA: Diagnosis not present

## 2021-06-18 DIAGNOSIS — I071 Rheumatic tricuspid insufficiency: Secondary | ICD-10-CM | POA: Diagnosis not present

## 2021-06-18 DIAGNOSIS — I4821 Permanent atrial fibrillation: Secondary | ICD-10-CM

## 2021-06-18 DIAGNOSIS — I5033 Acute on chronic diastolic (congestive) heart failure: Secondary | ICD-10-CM

## 2021-06-18 DIAGNOSIS — I340 Nonrheumatic mitral (valve) insufficiency: Secondary | ICD-10-CM

## 2021-06-18 DIAGNOSIS — I493 Ventricular premature depolarization: Secondary | ICD-10-CM | POA: Diagnosis not present

## 2021-06-18 LAB — CBC
HCT: 37.4 % (ref 36.0–46.0)
Hemoglobin: 11.4 g/dL — ABNORMAL LOW (ref 12.0–15.0)
MCH: 28.7 pg (ref 26.0–34.0)
MCHC: 30.5 g/dL (ref 30.0–36.0)
MCV: 94.2 fL (ref 80.0–100.0)
Platelets: 190 10*3/uL (ref 150–400)
RBC: 3.97 MIL/uL (ref 3.87–5.11)
RDW: 14.2 % (ref 11.5–15.5)
WBC: 7.6 10*3/uL (ref 4.0–10.5)
nRBC: 0 % (ref 0.0–0.2)

## 2021-06-18 LAB — MAGNESIUM: Magnesium: 2 mg/dL (ref 1.7–2.4)

## 2021-06-18 LAB — TSH: TSH: 2.55 u[IU]/mL (ref 0.350–4.500)

## 2021-06-18 LAB — BRAIN NATRIURETIC PEPTIDE: B Natriuretic Peptide: 291 pg/mL — ABNORMAL HIGH (ref 0.0–100.0)

## 2021-06-18 NOTE — Patient Instructions (Signed)
Medication Instructions:  Your physician recommends that you continue on your current medications as directed. Please refer to the Current Medication list given to you today.   *If you need a refill on your cardiac medications before your next appointment, please call your pharmacy*   Lab Work: Your physician recommends that you return for lab work in: BMET, BNP, CBC, TSH.   If you have labs (blood work) drawn today and your tests are completely normal, you will receive your results only by: MyChart Message (if you have MyChart) OR A paper copy in the mail If you have any lab test that is abnormal or we need to change your treatment, we will call you to review the results.   Testing/Procedures: NONE    Follow-Up: At [facility], you and your health needs are our priority.  As part of our continuing mission to provide you with exceptional heart care, we have created designated Provider Care Teams.  These Care Teams include your primary Cardiologist (physician) and Advanced Practice Providers (APPs -  Physician Assistants and Nurse Practitioners) who all work together to provide you with the care you need, when you need it.  We recommend signing up for the patient portal called "MyChart".  Sign up information is provided on this After Visit Summary.  MyChart is used to connect with patients for Virtual Visits (Telemedicine).  Patients are able to view lab/test results, encounter notes, upcoming appointments, etc.  Non-urgent messages can be sent to your provider as well.   To learn more about what you can do with MyChart, go to [email or web address].    Your next appointment:   2 week(s)  The format for your next appointment:   In Person  Provider:   You may see Nona Dell, MD or one of the following Advanced Practice Providers on your designated Care Team:   Randall An, PA-C  Jacolyn Reedy, PA-C    Other Instructions Thank you for choosing [facility]!

## 2021-06-18 NOTE — Addendum Note (Signed)
Addended by: Kerney Elbe on: 06/18/2021 03:55 PM   Modules accepted: Orders

## 2021-06-21 ENCOUNTER — Telehealth: Payer: Self-pay

## 2021-06-21 NOTE — Telephone Encounter (Signed)
I spoke with son who is the person who drives for Ms. Cawley . I explained that it was important that she get her lab work today. Son states he cannot bring her today but would have labs drawn on Monday 06/24/21.

## 2021-06-21 NOTE — Telephone Encounter (Signed)
-----   Message from Laurann [place], [place] sent at 06/21/2021  7:44 AM EDT ----- Sending to [facility] in case someone is off - do not yet see BMET resulted (see notes below). Need this lab to be able to give further med changes for her CHF. Can we please check on this? Would order stat and have her come in this AM so we can call her with further instructions. Thx.

## 2021-06-25 ENCOUNTER — Other Ambulatory Visit (HOSPITAL_COMMUNITY)
Admission: RE | Admit: 2021-06-25 | Discharge: 2021-06-25 | Disposition: A | Discharge status: Home / Self Care | Payer: Medicare Other | Source: Ambulatory Visit | Attending: Physician Assistant | Admitting: Physician Assistant

## 2021-06-25 DIAGNOSIS — I5033 Acute on chronic diastolic (congestive) heart failure: Secondary | ICD-10-CM | POA: Diagnosis not present

## 2021-06-25 DIAGNOSIS — I272 Pulmonary hypertension, unspecified: Secondary | ICD-10-CM | POA: Insufficient documentation

## 2021-06-25 LAB — BASIC METABOLIC PANEL
Anion gap: 7 (ref 5–15)
BUN: 11 mg/dL (ref 8–23)
CO2: 33 mmol/L — ABNORMAL HIGH (ref 22–32)
Calcium: 9.4 mg/dL (ref 8.9–10.3)
Chloride: 95 mmol/L — ABNORMAL LOW (ref 98–111)
Creatinine, Ser: 0.95 mg/dL (ref 0.44–1.00)
GFR, Estimated: >60 mL/min (ref >60)
Glucose, Bld: 140 mg/dL — ABNORMAL HIGH (ref 70–99)
Potassium: 3.6 mmol/L (ref 3.5–5.1)
Sodium: 135 mmol/L (ref 135–145)

## 2021-06-25 NOTE — Progress Notes (Signed)
Cardiology Office Note    Date:  06/26/2021   ID:  Christina Frey, DOB 10/29/1940, MRN [id number]  PCP:  Elfredia Nevins, MD  Cardiologist: Nona Dell, MD    Chief Complaint  Patient presents with   Follow-up    1 week visit    History of Present Illness:    Christina Frey is a 80 y.o. female with past medical history of persistent atrial fibrillation, HFpEF, ASD, pulmonary HTN, HTN, Hypothyroidism, PUD and OSA (previously intolerant to CPAP) who presents to the office today for 1-week follow-up.   She was examined by Dr. Diona Browner in 05/2021 and reported a 20 lb weight gain within 6 months with worsening edema despite compliance with Lasix 40mg  daily. This was increased to 40mg  BID and a repeat echo was obtained which showed a preserved EF of 65-70% with no regional WMA. RV function was mildly reduced and PASP was moderately elevated at 49 mmHg. She did see , PA-C on 06/18/2021 and her weight had increased by 4 lbs since her prior visit. Repeat labs were recommended with plans to switch to Torsemide pending reassessment of her renal function. Was also recommended she have a 3-day Zio patch to assess PVC burden.   In talking with the patient and her son today, she reports still having significant lower extremity edema. Weight has been stable on her home scales but has declined by 4 lbs on the office scales since her last visit. Reports her baseline weight is in the 170's so still approximately 20 lbs above this. She does sleep with her head elevated. No specific PND. Scheduled to see Pulmonology later this year and we reviewed the indications for this. No recent chest pain but she does experience intermittent palpitations which spontaneously resolve.   She does not add salt to her food but her son comments that she consumes "junk food" a majority of the time. She has been reading nutrition labels and is trying to consume foods lower in sodium.    Past Medical History:   Diagnosis Date   ASD (atrial septal defect)    Chronic diastolic CHF (congestive heart failure) (HCC)    Chronic pain    Essential hypertension    GERD (gastroesophageal reflux disease)    Hyperlipidemia    Hypothyroidism    Mitral regurgitation    Obesity    OSA (obstructive sleep apnea)    Unable to wear CPAP   Osteoarthritis    Persistent atrial fibrillation (HCC)    PUD (peptic ulcer disease)    Pulmonary hypertension (HCC)    Tricuspid regurgitation    Type 2 diabetes mellitus (HCC)     Past Surgical History:  Procedure Laterality Date   TOTAL KNEE ARTHROPLASTY     TUBAL LIGATION      Current Medications: Outpatient Medications Prior to Visit  Medication Sig Dispense Refill   carvedilol (COREG) 3.125 MG tablet Take 1 tablet (3.125 mg total) by mouth 2 (two) times daily. 180 tablet 3   citalopram (CELEXA) 20 MG tablet Take 10 mg by mouth daily.     DULoxetine (CYMBALTA) 30 MG capsule Take 30 mg by mouth 2 (two) times daily.     ELIQUIS 5 MG TABS tablet TAKE 1 TABLET BY MOUTH TWICE A DAY 60 tablet 6   gabapentin (NEURONTIN) 300 MG capsule Take 1 capsule by mouth 3 (three) times daily.      latanoprost (XALATAN) 0.005 % ophthalmic solution Place 1 drop into the left eye  at bedtime.     latanoprost (XALATAN) 0.005 % ophthalmic solution PLACE 1 DROP INTO THE LEFT EYE NIGHTLY.     levothyroxine (SYNTHROID) 100 MCG tablet Take 100 mcg by mouth daily before breakfast.     lisinopril (ZESTRIL) 5 MG tablet Take 5 mg by mouth daily.     mirtazapine (REMERON) 15 MG tablet Take 15 mg by mouth at bedtime.     oxybutynin (DITROPAN XL) 15 MG 24 hr tablet Take 15 mg by mouth at bedtime.     pantoprazole (PROTONIX) 40 MG tablet Take 40 mg by mouth daily.     traMADol (ULTRAM) 50 MG tablet Take 50 mg by mouth as needed.     furosemide (LASIX) 40 MG tablet Take 1 tablet (40 mg total) by mouth 2 (two) times daily. 180 tablet 3   potassium chloride SA (KLOR-CON) 20 MEQ tablet Take 1  tablet (20 mEq total) by mouth daily. 90 tablet 3   No facility-administered medications prior to visit.     Allergies:   Other and Zetia [ezetimibe]   Social History   Socioeconomic History   Marital status: Widowed    Spouse name: Not on file   Number of children: Not on file   Years of education: Not on file   Highest education level: Not on file  Occupational History   Not on file  Tobacco Use   Smoking status: Never   Smokeless tobacco: Never  Vaping Use   Vaping Use: Never used  Substance and Sexual Activity   Alcohol use: No   Drug use: No   Sexual activity: Not on file  Other Topics Concern   Not on file  Social History Narrative   Not on file   Social Determinants of Health   Financial Resource Strain: Not on file  Food Insecurity: Not on file  Transportation Needs: Not on file  Physical Activity: Not on file  Stress: Not on file  Social Connections: Not on file     Family History:  The patient's family history includes ALS in her brother; Arthritis in her brother; Diabetes in her sister; Heart disease in her brother; Hypertension in her brother.   Review of Systems:    Please see the history of present illness.     All other systems reviewed and are otherwise negative except as noted above.   Physical Exam:    VS:  BP 122/78   Pulse 78   Ht 5\' 2"  (1.575 m)   Wt 195 lb 12.8 oz (88.8 kg)   SpO2 93%   BMI 35.81 kg/m    General: Well developed, well nourished,female appearing in no acute distress. Head: Normocephalic, atraumatic. Neck: No carotid bruits. JVD not elevated.  Lungs: Respirations regular and unlabored, without wheezes or rales.  Heart: Irregularly irregular. No S3 or S4. 2/6 SEM along LLSB Abdomen: Appears non-distended. No obvious abdominal masses. Msk:  Strength and tone appear normal for age. No obvious joint deformities or effusions. Extremities: No clubbing or cyanosis. 2+ pitting edema bilaterally.  Distal pedal pulses are 2+  bilaterally. Neuro: Alert and oriented X 3. Moves all extremities spontaneously. No focal deficits noted. Psych:  Responds to questions appropriately with a normal affect. Skin: No rashes or lesions noted  Wt Readings from Last 3 Encounters:  06/26/21 195 lb 12.8 oz (88.8 kg)  06/18/21 199 lb (90.3 kg)  05/20/21 195 lb (88.5 kg)     Studies/Labs Reviewed:   EKG:  EKG is not ordered  today.   Recent Labs: 06/18/2021: B Natriuretic Peptide 291.0; Hemoglobin 11.4; Magnesium 2.0; Platelets 190; TSH 2.550 06/25/2021: BUN 11; Creatinine, Ser 0.95; Potassium 3.6; Sodium 135   Lipid Panel No results found for: CHOL, TRIG, HDL, CHOLHDL, VLDL, LDLCALC, LDLDIRECT  Additional studies/ records that were reviewed today include:   Echocardiogram: 06/2021 IMPRESSIONS     1. Left ventricular ejection fraction, by estimation, is 65 to 70%. The  left ventricle has normal function. The left ventricle has no regional  wall motion abnormalities. Left ventricular diastolic parameters are  indeterminate. There is the  interventricular septum is flattened in systole and diastole, consistent  with right ventricular pressure and volume overload.   2. Right ventricular systolic function is mildly reduced. The right  ventricular size is moderately enlarged. There is moderately elevated  pulmonary artery systolic pressure. The estimated right ventricular  systolic pressure is 49.7 mmHg.   3. Left atrial size was severely dilated.   4. Right atrial size was severely dilated.   5. A small pericardial effusion is present. The pericardial effusion is  posterior to the left ventricle.   6. The mitral valve is abnormal. Mild mitral valve regurgitation.  Moderate mitral annular calcification.   7. Tricuspid valve regurgitation is mild to moderate.   8. The aortic valve is tricuspid. Aortic valve regurgitation is not  visualized. Mild to moderate aortic valve sclerosis/calcification is  present, without any  evidence of aortic stenosis. Aortic valve mean  gradient measures 3.0 mmHg.   9. The inferior vena cava is dilated in size with >50% respiratory  variability, suggesting right atrial pressure of 8 mmHg.  10. Evidence of atrial level shunting detected by color flow Doppler.  There is a moderately sized secundum atrial septal defect with  predominantly left to right shunting across the atrial septum.   Comparison(s): Prior images unable to be directly viewed.   Assessment:    1. Acute on chronic diastolic (congestive) heart failure (HCC)   2. Moderate pulmonary hypertension (HCC)   3. Persistent atrial fibrillation (HCC)   4. PVC's (premature ventricular contractions)   5. Medication management   6. Atrial septal defect   7. Essential hypertension      Plan:   In order of problems listed above:  1. Acute on Chronic HFpEF - Her weight has declined by 4 lbs since her prior visit but she will remains 20+ lbs above her baseline and has significant edema on examination today despite titration of Lasix. We reviewed switching to Torsemide as previously recommended and she in agreement with this. Therefore, will stop Lasix and switch to Torsemide 20mg  BID and also titrate K-dur to 20 mEq BID given her low-normal K+ of 3.6 by labs yesterday. Repeat BMET in 7- 10 days. She was encouraged to continue to follow a low-sodium diet.   2. Abnormal RV/Pulmonary HTN - Recent echocardiogram showed RV function was mildly reduced and she did have elevated PASP of 49 mmHg. Suspect this is secondary to her CHF and untreated OSA. She was referred to [facility] at the time of her last visit and is scheduled to see them later this year. She is interested in a Home Sleep Study if this option is available.   3. Persistent Atrial Fibrillation - She does report occasional palpitations and it is unclear if this is secondary to her AF or PVC's. Will plan for a 3-day Zio as outlined below. Continue Coreg  3.125mg  BID for rate-control and Eliquis 5mg  BID for anticoagulation  which is the appropriate dose at this time based off her age, weight and kidney function.   4. PVC's - She was noted to have frequent PVC's on her most recent EKG's and a 3-day Zio patch was recommended at her last visit but has not yet been placed. Will place today. Continue Coreg at current dosing for now.   5. ASD - Conservative management previously recommended given her age and due to the fact she is already on anticoagulation for her atrial fibrillation.   6. HTN - Her blood pressure is well-controlled at 122/78 during today's visit. Continue current medication regimen with Coreg 3.125mg  BID and Lisinopril 5mg  daily.    Medication Adjustments/Labs and Tests Ordered: Current medicines are reviewed at length with the patient today.  Concerns regarding medicines are outlined above.  Medication changes, Labs and Tests ordered today are listed in the Patient Instructions below. Patient Instructions  Medication Instructions:   Stop Taking Lasix   Start Torsemide 20 mg Two Times Daily  Increase Potassium to 20 meq Two Times Daily   *If you need a refill on your cardiac medications before your next appointment, please call your pharmacy*   Lab Work: Your physician recommends that you return for lab work in: 2 weeks ( 07/10/21) (BMET)   If you have labs (blood work) drawn today and your tests are completely normal, you will receive your results only by: MyChart Message (if you have MyChart) OR A paper copy in the mail If you have any lab test that is abnormal or we need to change your treatment, we will call you to review the results.   Testing/Procedures: Christena Deem- Long Term Monitor Instructions   Your physician has requested you wear your ZIO patch monitor___3___days.   This is a single patch monitor.  [employer] supplies one patch monitor per enrollment.  Additional stickers are not available.   Please do not  apply patch if you will be having a Nuclear Stress Test, Echocardiogram, Cardiac CT, MRI, or Chest Xray during the time frame you would be wearing the monitor. The patch cannot be worn during these tests.  You cannot remove and re-apply the ZIO XT patch monitor.   Your ZIO patch monitor will be sent USPS Priority mail from [facility] directly to your home address. The monitor may also be mailed to a PO BOX if home delivery is not available.   It may take 3-5 days to receive your monitor after you have been enrolled.   Once you have received you monitor, please review enclosed instructions.  Your monitor has already been registered assigning a specific monitor serial # to you.   Applying the monitor   Shave hair from upper left chest.   Hold abrader disc by orange tab.  Rub abrader in 40 strokes over left upper chest as indicated in your monitor instructions.   Clean area with 4 enclosed alcohol pads .  Use all pads to assure are is cleaned thoroughly.  Let dry.   Apply patch as indicated in monitor instructions.  Patch will be place under collarbone on left side of chest with arrow pointing upward.   Rub patch adhesive wings for 2 minutes.Remove white label marked "1".  Remove white label marked "2".  Rub patch adhesive wings for 2 additional minutes.   While looking in a mirror, press and release button in center of patch.  A small green light will flash 3-4 times .  This will be your only indicator the  monitor has been turned on.     Do not shower for the first 24 hours.  You may shower after the first 24 hours.   Press button if you feel a symptom. You will hear a small click.  Record Date, Time and Symptom in the Patient Log Book.   When you are ready to remove patch, follow instructions on last 2 pages of Patient Log Book.  Stick patch monitor onto last page of Patient Log Book.   Place Patient Log Book in [place] box.  Use locking tab on box and tape box closed securely.  The  [employer] and [employer] has [employer] on it.  Please place in mailbox as soon as possible.  Your physician should have your test results approximately 7 days after the monitor has been mailed back to [facility].   Call [facility] Customer Care at [phone] if you have questions regarding your ZIO XT patch monitor.  Call them immediately if you see an orange light blinking on your monitor.   If your monitor falls off in less than 4 days contact our Monitor department at [phone].  If your monitor becomes loose or falls off after 4 days call [employer] at [phone] for suggestions on securing your monitor.     Follow-Up: At [facility], you and your health needs are our priority.  As part of our continuing mission to provide you with exceptional heart care, we have created designated Provider Care Teams.  These Care Teams include your primary Cardiologist (physician) and Advanced Practice Providers (APPs -  Physician Assistants and Nurse Practitioners) who all work together to provide you with the care you need, when you need it.  We recommend signing up for the patient portal called "MyChart".  Sign up information is provided on this After Visit Summary.  MyChart is used to connect with patients for Virtual Visits (Telemedicine).  Patients are able to view lab/test results, encounter notes, upcoming appointments, etc.  Non-urgent messages can be sent to your provider as well.   To learn more about what you can do with MyChart, go to [email or web address].    Your next appointment:   3-4 week(s)  The format for your next appointment:   In Person  Provider:   Nona Dell, MD or Randall An, PA-C   Other Instructions Thank you for choosing [facility]!      Signed, Ellsworth Lennox, PA-C  06/26/2021 7:55 PM     [facility] [address] [place], [place] [address] Phone: [phone] Fax: [phone]

## 2021-06-26 ENCOUNTER — Encounter: Payer: Self-pay | Admitting: Student

## 2021-06-26 ENCOUNTER — Ambulatory Visit (INDEPENDENT_AMBULATORY_CARE_PROVIDER_SITE_OTHER): Payer: Medicare Other

## 2021-06-26 ENCOUNTER — Ambulatory Visit (INDEPENDENT_AMBULATORY_CARE_PROVIDER_SITE_OTHER): Payer: Medicare Other | Admitting: Student

## 2021-06-26 ENCOUNTER — Other Ambulatory Visit: Payer: Self-pay

## 2021-06-26 VITALS — BP 122/78 | HR 78 | Ht 62.0 in | Wt 195.8 lb

## 2021-06-26 DIAGNOSIS — Z79899 Other long term (current) drug therapy: Secondary | ICD-10-CM

## 2021-06-26 DIAGNOSIS — I272 Pulmonary hypertension, unspecified: Secondary | ICD-10-CM | POA: Diagnosis not present

## 2021-06-26 DIAGNOSIS — Q211 Atrial septal defect, unspecified: Secondary | ICD-10-CM

## 2021-06-26 DIAGNOSIS — I4819 Other persistent atrial fibrillation: Secondary | ICD-10-CM | POA: Diagnosis not present

## 2021-06-26 DIAGNOSIS — I493 Ventricular premature depolarization: Secondary | ICD-10-CM

## 2021-06-26 DIAGNOSIS — I10 Essential (primary) hypertension: Secondary | ICD-10-CM | POA: Diagnosis not present

## 2021-06-26 DIAGNOSIS — I5033 Acute on chronic diastolic (congestive) heart failure: Secondary | ICD-10-CM | POA: Diagnosis not present

## 2021-06-26 MED ORDER — POTASSIUM CHLORIDE CRYS ER 20 MEQ PO TBCR: 20.0000 meq | EXTENDED_RELEASE_TABLET | Freq: Two times a day (BID) | ORAL | 3 refills | Status: DC

## 2021-06-26 MED ORDER — TORSEMIDE 20 MG PO TABS: 20.0000 mg | ORAL_TABLET | Freq: Two times a day (BID) | ORAL | 3 refills | Status: DC

## 2021-06-26 NOTE — Patient Instructions (Signed)
Medication Instructions:   Stop Taking Lasix   Start Torsemide 20 mg Two Times Daily  Increase Potassium to 20 meq Two Times Daily   *If you need a refill on your cardiac medications before your next appointment, please call your pharmacy*   Lab Work: Your physician recommends that you return for lab work in: 2 weeks ( 07/10/21) (BMET)   If you have labs (blood work) drawn today and your tests are completely normal, you will receive your results only by: MyChart Message (if you have MyChart) OR A paper copy in the mail If you have any lab test that is abnormal or we need to change your treatment, we will call you to review the results.   Testing/Procedures: Christena Deem- Long Term Monitor Instructions   Your physician has requested you wear your ZIO patch monitor___3___days.   This is a single patch monitor.  [employer] supplies one patch monitor per enrollment.  Additional stickers are not available.   Please do not apply patch if you will be having a Nuclear Stress Test, Echocardiogram, Cardiac CT, MRI, or Chest Xray during the time frame you would be wearing the monitor. The patch cannot be worn during these tests.  You cannot remove and re-apply the ZIO XT patch monitor.   Your ZIO patch monitor will be sent USPS Priority mail from [facility] directly to your home address. The monitor may also be mailed to a PO BOX if home delivery is not available.   It may take 3-5 days to receive your monitor after you have been enrolled.   Once you have received you monitor, please review enclosed instructions.  Your monitor has already been registered assigning a specific monitor serial # to you.   Applying the monitor   Shave hair from upper left chest.   Hold abrader disc by orange tab.  Rub abrader in 40 strokes over left upper chest as indicated in your monitor instructions.   Clean area with 4 enclosed alcohol pads .  Use all pads to assure are is cleaned thoroughly.  Let dry.    Apply patch as indicated in monitor instructions.  Patch will be place under collarbone on left side of chest with arrow pointing upward.   Rub patch adhesive wings for 2 minutes.Remove white label marked "1".  Remove white label marked "2".  Rub patch adhesive wings for 2 additional minutes.   While looking in a mirror, press and release button in center of patch.  A small green light will flash 3-4 times .  This will be your only indicator the monitor has been turned on.     Do not shower for the first 24 hours.  You may shower after the first 24 hours.   Press button if you feel a symptom. You will hear a small click.  Record Date, Time and Symptom in the Patient Log Book.   When you are ready to remove patch, follow instructions on last 2 pages of Patient Log Book.  Stick patch monitor onto last page of Patient Log Book.   Place Patient Log Book in [place] box.  Use locking tab on box and tape box closed securely.  The [employer] and [employer] has [employer] on it.  Please place in mailbox as soon as possible.  Your physician should have your test results approximately 7 days after the monitor has been mailed back to [facility].   Call [facility] Customer Care at [phone] if you have questions regarding  your ZIO XT patch monitor.  Call them immediately if you see an orange light blinking on your monitor.   If your monitor falls off in less than 4 days contact our Monitor department at [phone].  If your monitor becomes loose or falls off after 4 days call [employer] at [phone] for suggestions on securing your monitor.     Follow-Up: At [facility], you and your health needs are our priority.  As part of our continuing mission to provide you with exceptional heart care, we have created designated Provider Care Teams.  These Care Teams include your primary Cardiologist (physician) and Advanced Practice Providers (APPs -  Physician Assistants and Nurse Practitioners)  who all work together to provide you with the care you need, when you need it.  We recommend signing up for the patient portal called "MyChart".  Sign up information is provided on this After Visit Summary.  MyChart is used to connect with patients for Virtual Visits (Telemedicine).  Patients are able to view lab/test results, encounter notes, upcoming appointments, etc.  Non-urgent messages can be sent to your provider as well.   To learn more about what you can do with MyChart, go to [email or web address].    Your next appointment:   3-4 week(s)  The format for your next appointment:   In Person  Provider:   Nona Dell, MD or Randall An, PA-C   Other Instructions Thank you for choosing [facility]!

## 2021-07-05 DIAGNOSIS — I493 Ventricular premature depolarization: Secondary | ICD-10-CM | POA: Diagnosis not present

## 2021-07-12 ENCOUNTER — Telehealth: Payer: Self-pay

## 2021-07-12 ENCOUNTER — Other Ambulatory Visit (HOSPITAL_COMMUNITY)
Admission: RE | Admit: 2021-07-12 | Discharge: 2021-07-12 | Disposition: A | Discharge status: Home / Self Care | Payer: Medicare Other | Source: Ambulatory Visit | Attending: Student | Admitting: Student

## 2021-07-12 DIAGNOSIS — Z79899 Other long term (current) drug therapy: Secondary | ICD-10-CM | POA: Insufficient documentation

## 2021-07-12 DIAGNOSIS — I5033 Acute on chronic diastolic (congestive) heart failure: Secondary | ICD-10-CM | POA: Diagnosis not present

## 2021-07-12 LAB — BASIC METABOLIC PANEL
Anion gap: 5 (ref 5–15)
BUN: 16 mg/dL (ref 8–23)
CO2: 32 mmol/L (ref 22–32)
Calcium: 9.2 mg/dL (ref 8.9–10.3)
Chloride: 98 mmol/L (ref 98–111)
Creatinine, Ser: 0.96 mg/dL (ref 0.44–1.00)
GFR, Estimated: 59 mL/min — ABNORMAL LOW (ref >60)
Glucose, Bld: 115 mg/dL — ABNORMAL HIGH (ref 70–99)
Potassium: 3.7 mmol/L (ref 3.5–5.1)
Sodium: 135 mmol/L (ref 135–145)

## 2021-07-12 NOTE — Telephone Encounter (Signed)
Pt notified of results. Pt stated that weight is at 194 lb

## 2021-07-12 NOTE — Telephone Encounter (Signed)
-----   Message from Ellsworth Lennox, [place] sent at 07/12/2021  4:17 PM EDT ----- Please let the patient know her electrolytes and kidney function are within a normal range since switching to Torsemide. Please see how her edema has been doing since the switch and see if she has an update on her weight?

## 2021-07-17 ENCOUNTER — Ambulatory Visit: Payer: Medicare Other | Admitting: Student

## 2021-07-25 NOTE — Progress Notes (Deleted)
Cardiology Office Note    Date:  07/25/2021   ID:  Christina Frey, DOB [birth year], MRN [id number]  PCP:  Redmond School, MD  Cardiologist: Rozann Lesches, MD    No chief complaint on file.   History of Present Illness:    Christina Frey is a 80 y.o. female with past medical history of persistent atrial fibrillation, HFpEF, ASD, pulmonary HTN, HTN, Hypothyroidism, PUD and OSA (previously intolerant to CPAP) who presents to the office today for 4-week follow-up.  She was examined myself in 06/2021 reported baseline weight in the 170s and was still approximate 20 pounds above her baseline.  She did report worsening lower extremity edema but denied any significant changes in her respiratory status.  She was switched from Lasix to torsemide 20 mg twice daily and was encouraged to keep follow-up with pulmonology in regards to upcoming sleep study.  A 3-day Zio patch was also recommended to assess her PVC burden given frequent PVC's by prior EKG. Monitor did show she was in atrial fibrillation throughout the study and heart rate was well controlled in the 70's. Her PVC burden was only 2% and she was continued on Coreg.    Past Medical History:  Diagnosis Date   ASD (atrial septal defect)    Chronic diastolic CHF (congestive heart failure) (HCC)    Chronic pain    Essential hypertension    GERD (gastroesophageal reflux disease)    Hyperlipidemia    Hypothyroidism    Mitral regurgitation    Obesity    OSA (obstructive sleep apnea)    Unable to wear CPAP   Osteoarthritis    Persistent atrial fibrillation (HCC)    PUD (peptic ulcer disease)    Pulmonary hypertension (HCC)    Tricuspid regurgitation    Type 2 diabetes mellitus ([place])     Past Surgical History:  Procedure Laterality Date   TOTAL KNEE ARTHROPLASTY     TUBAL LIGATION      Current Medications: Outpatient Medications Prior to Visit  Medication Sig Dispense Refill   carvedilol (COREG) 3.125 MG tablet Take 1  tablet (3.125 mg total) by mouth 2 (two) times daily. 180 tablet 3   citalopram (CELEXA) 20 MG tablet Take 10 mg by mouth daily.     DULoxetine (CYMBALTA) 30 MG capsule Take 30 mg by mouth 2 (two) times daily.     ELIQUIS 5 MG TABS tablet TAKE 1 TABLET BY MOUTH TWICE A DAY 60 tablet 6   gabapentin (NEURONTIN) 300 MG capsule Take 1 capsule by mouth 3 (three) times daily.      latanoprost (XALATAN) 0.005 % ophthalmic solution Place 1 drop into the left eye at bedtime.     latanoprost (XALATAN) 0.005 % ophthalmic solution PLACE 1 DROP INTO THE LEFT EYE NIGHTLY.     levothyroxine (SYNTHROID) 100 MCG tablet Take 100 mcg by mouth daily before breakfast.     lisinopril (ZESTRIL) 5 MG tablet Take 5 mg by mouth daily.     mirtazapine (REMERON) 15 MG tablet Take 15 mg by mouth at bedtime.     oxybutynin (DITROPAN XL) 15 MG 24 hr tablet Take 15 mg by mouth at bedtime.     pantoprazole (PROTONIX) 40 MG tablet Take 40 mg by mouth daily.     potassium chloride SA (KLOR-CON) 20 MEQ tablet Take 1 tablet (20 mEq total) by mouth 2 (two) times daily. 180 tablet 3   torsemide (DEMADEX) 20 MG tablet Take 1 tablet (20 mg total)  by mouth 2 (two) times daily. 180 tablet 3   traMADol (ULTRAM) 50 MG tablet Take 50 mg by mouth as needed.     No facility-administered medications prior to visit.     Allergies:   Other and Zetia [ezetimibe]   Social History   Socioeconomic History   Marital status: Widowed    Spouse name: Not on file   Number of children: Not on file   Years of education: Not on file   Highest education level: Not on file  Occupational History   Not on file  Tobacco Use   Smoking status: Never   Smokeless tobacco: Never  Vaping Use   Vaping Use: Never used  Substance and Sexual Activity   Alcohol use: No   Drug use: No   Sexual activity: Not on file  Other Topics Concern   Not on file  Social History Narrative   Not on file   Social Determinants of Health   Financial Resource  Strain: Not on file  Food Insecurity: Not on file  Transportation Needs: Not on file  Physical Activity: Not on file  Stress: Not on file  Social Connections: Not on file     Family History:  The patient's ***family history includes ALS in her brother; Arthritis in her brother; Diabetes in her sister; Heart disease in her brother; Hypertension in her brother.   Review of Systems:    Please see the history of present illness.     All other systems reviewed and are otherwise negative except as noted above.   Physical Exam:    VS:  There were no vitals taken for this visit.   General: Well developed, well nourished,female appearing in no acute distress. Head: Normocephalic, atraumatic. Neck: No carotid bruits. JVD not elevated.  Lungs: Respirations regular and unlabored, without wheezes or rales.  Heart: ***Regular rate and rhythm. No S3 or S4.  No murmur, no rubs, or gallops appreciated. Abdomen: Appears non-distended. No obvious abdominal masses. Msk:  Strength and tone appear normal for age. No obvious joint deformities or effusions. Extremities: No clubbing or cyanosis. No edema.  Distal pedal pulses are 2+ bilaterally. Neuro: Alert and oriented X 3. Moves all extremities spontaneously. No focal deficits noted. Psych:  Responds to questions appropriately with a normal affect. Skin: No rashes or lesions noted  Wt Readings from Last 3 Encounters:  06/26/21 195 lb 12.8 oz (88.8 kg)  06/18/21 199 lb (90.3 kg)  05/20/21 195 lb (88.5 kg)        Studies/Labs Reviewed:   EKG:  EKG is*** ordered today.  The ekg ordered today demonstrates ***  Recent Labs: 06/18/2021: B Natriuretic Peptide 291.0; Hemoglobin 11.4; Magnesium 2.0; Platelets 190; TSH 2.550 07/12/2021: BUN 16; Creatinine, Ser 0.96; Potassium 3.7; Sodium 135   Lipid Panel No results found for: CHOL, TRIG, HDL, CHOLHDL, VLDL, LDLCALC, LDLDIRECT  Additional studies/ records that were reviewed today include:    Echocardiogram: 06/2021 IMPRESSIONS     1. Left ventricular ejection fraction, by estimation, is 65 to 70%. The  left ventricle has normal function. The left ventricle has no regional  wall motion abnormalities. Left ventricular diastolic parameters are  indeterminate. There is the  interventricular septum is flattened in systole and diastole, consistent  with right ventricular pressure and volume overload.   2. Right ventricular systolic function is mildly reduced. The right  ventricular size is moderately enlarged. There is moderately elevated  pulmonary artery systolic pressure. The estimated right ventricular  systolic  pressure is 49.7 mmHg.   3. Left atrial size was severely dilated.   4. Right atrial size was severely dilated.   5. A small pericardial effusion is present. The pericardial effusion is  posterior to the left ventricle.   6. The mitral valve is abnormal. Mild mitral valve regurgitation.  Moderate mitral annular calcification.   7. Tricuspid valve regurgitation is mild to moderate.   8. The aortic valve is tricuspid. Aortic valve regurgitation is not  visualized. Mild to moderate aortic valve sclerosis/calcification is  present, without any evidence of aortic stenosis. Aortic valve mean  gradient measures 3.0 mmHg.   9. The inferior vena cava is dilated in size with >50% respiratory  variability, suggesting right atrial pressure of 8 mmHg.  10. Evidence of atrial level shunting detected by color flow Doppler.  There is a moderately sized secundum atrial septal defect with  predominantly left to right shunting across the atrial septum.   Comparison(s): Prior images unable to be directly viewed.    Holter Monitor: 06/2021 ZIO XT reviewed.  2 days, 12 hours analyzed.  Atrial fibrillation was present throughout with heart rate ranging from 50 bpm up to 106 bpm and average heart rate 74 bpm.  There were occasional PVCs representing 2% total beats with otherwise  rare ventricular couplets and limited ventricular bigeminy.  No pauses were noted.  Assessment:    No diagnosis found.   Plan:   In order of problems listed above:  ***    Shared Decision Making/Informed Consent:   {Are you ordering a CV Procedure (e.g. stress test, cath, DCCV, TEE, etc)?   Press F2        :[id number]    Medication Adjustments/Labs and Tests Ordered: Current medicines are reviewed at length with the patient today.  Concerns regarding medicines are outlined above.  Medication changes, Labs and Tests ordered today are listed in the Patient Instructions below. There are no Patient Instructions on file for this visit.   Signed, Erma Heritage, PA-C  07/25/2021 1:02 PM    [facility] [address] [place], [place] [address] Phone: [phone] Fax: [phone]

## 2021-07-26 ENCOUNTER — Ambulatory Visit: Payer: Medicare Other | Admitting: Student

## 2021-08-16 ENCOUNTER — Institutional Professional Consult (permissible substitution): Payer: Medicare Other | Admitting: Pulmonary Disease

## 2021-09-19 DIAGNOSIS — H401122 Primary open-angle glaucoma, left eye, moderate stage: Secondary | ICD-10-CM | POA: Diagnosis not present

## 2021-10-18 ENCOUNTER — Encounter: Payer: Self-pay | Admitting: Pulmonary Disease

## 2021-10-18 ENCOUNTER — Other Ambulatory Visit: Payer: Self-pay

## 2021-10-18 ENCOUNTER — Ambulatory Visit (INDEPENDENT_AMBULATORY_CARE_PROVIDER_SITE_OTHER): Payer: Medicare Other | Admitting: Pulmonary Disease

## 2021-10-18 VITALS — BP 136/82 | HR 68 | Temp 98.2°F | Ht 64.0 in | Wt 199.0 lb

## 2021-10-18 DIAGNOSIS — I10 Essential (primary) hypertension: Secondary | ICD-10-CM | POA: Diagnosis not present

## 2021-10-18 DIAGNOSIS — R0683 Snoring: Secondary | ICD-10-CM

## 2021-10-18 DIAGNOSIS — G4733 Obstructive sleep apnea (adult) (pediatric): Secondary | ICD-10-CM

## 2021-10-18 DIAGNOSIS — R1319 Other dysphagia: Secondary | ICD-10-CM | POA: Diagnosis not present

## 2021-10-18 DIAGNOSIS — R1310 Dysphagia, unspecified: Secondary | ICD-10-CM | POA: Insufficient documentation

## 2021-10-18 NOTE — Assessment & Plan Note (Signed)
Obtain esophagram, prior esophagram showed narrowing of distal esophagus, rule out stricture

## 2021-10-18 NOTE — Patient Instructions (Addendum)
°  X home sleep study  Use compression stockings , sleep with legs elevated  X Esophagram  X BMET

## 2021-10-18 NOTE — Assessment & Plan Note (Signed)
Given excessive daytime somnolence, narrow pharyngeal exam, witnessed apneas & loud snoring, obstructive sleep apnea is very likely & an overnight polysomnogram will be scheduled as a home study. The pathophysiology of obstructive sleep apnea , it's cardiovascular consequences & modes of treatment including CPAP were discused with the patient in detail & they evidenced understanding.  She will be willing to retrial CPAP if we use nasal mask interface

## 2021-10-18 NOTE — Addendum Note (Signed)
Addended by: Carleene Mains D on: 10/18/2021 02:29 PM   Modules accepted: Orders

## 2021-10-18 NOTE — Progress Notes (Signed)
Subjective:    Patient ID: Christina Frey, female    DOB: 27-Jun-1941, 81 y.o.   MRN: [id number]  HPI  81 year old never smoker referred for evaluation of sleep disordered breathing and moderate pulmonary hypertension  PMH - persistent atrial fibrillation, HFpEF, ASD, pulmonary HTN, HTN, Hypothyroidism Elevated right hemidiaphragm  She is accompanied by her daughter-in-law Joseph Art today.  Besides the above problems she also reports persistent pedal edema worse over the last few months and increased redness of both legs.  She also reports difficulty swallowing to both liquids and solids. She had a sleep study done at ED and was told that she had sleep apnea, could not tolerate CPAP but did use it for a period of time, on a subsequent follow-up study she was told that she does not have sleep apnea and CPAP was discontinued.  I reviewed cardiology consultation  Effort sleepiness score is 8 and she reports excessive sleepiness in the daytime sometimes she will sleep all day and at other times only for a few hours.  Bedtime can be around 10 PM, sleep latency about 30 minutes, she sleeps on her back with 2 pillows, reports frequent nocturnal awakenings and nonrefreshing sleep and is out of bed anywhere between 9 AM to 10 AM feeling tired with dryness of mouth without headaches. She has gained about 20 pounds in the last few months There is no history suggestive of cataplexy, sleep paralysis or parasomnias  Chest x-ray 10/2016 personally reviewed shows elevated right hemidiaphragm  Significant tests/ events reviewed Esophagram 2009 smooth narrowing of the distal esophagus  Echo 06/2021 RVSP 49 mm   Past Medical History:  Diagnosis Date   ASD (atrial septal defect)    Chronic diastolic CHF (congestive heart failure) (HCC)    Chronic pain    Essential hypertension    GERD (gastroesophageal reflux disease)    Hyperlipidemia    Hypothyroidism    Mitral regurgitation    Obesity    OSA  (obstructive sleep apnea)    Unable to wear CPAP   Osteoarthritis    Persistent atrial fibrillation (HCC)    PUD (peptic ulcer disease)    Pulmonary hypertension (HCC)    Tricuspid regurgitation    Type 2 diabetes mellitus ([place])     Past Surgical History:  Procedure Laterality Date   TOTAL KNEE ARTHROPLASTY     TUBAL LIGATION      Allergies  Allergen Reactions   Other     Anti-inflammatory's-causes stomach ulcers   Zetia [Ezetimibe] Other (See Comments)    Lightheadedness     Social History   Socioeconomic History   Marital status: Widowed    Spouse name: Not on file   Number of children: Not on file   Years of education: Not on file   Highest education level: Not on file  Occupational History   Not on file  Tobacco Use   Smoking status: Never   Smokeless tobacco: Never  Vaping Use   Vaping Use: Never used  Substance and Sexual Activity   Alcohol use: No   Drug use: No   Sexual activity: Not on file  Other Topics Concern   Not on file  Social History Narrative   Not on file   Social Determinants of Health   Financial Resource Strain: Not on file  Food Insecurity: Not on file  Transportation Needs: Not on file  Physical Activity: Not on file  Stress: Not on file  Social Connections: Not on file  Intimate  Partner Violence: Not on file    Family History  Problem Relation Age of Onset   ALS Brother    Heart disease Brother    Hypertension Brother    Arthritis Brother    Diabetes Sister      Review of Systems Shortness of breath with activity Chest pain Weight gain 20 pounds Difficulty swallowing Anxiety depression Feet swelling Joint stiffness  Constitutional: negative for anorexia, fevers and sweats  Eyes: negative for irritation, redness and visual disturbance  Ears, nose, mouth, throat, and face: negative for earaches, epistaxis, nasal congestion and sore throat  Respiratory: negative for cough,  sputum and wheezing  Cardiovascular:  negative for chest pain, orthopnea, palpitations and syncope  Gastrointestinal: negative for abdominal pain, constipation, diarrhea, melena, nausea and vomiting  Genitourinary:negative for dysuria, frequency and hematuria  Hematologic/lymphatic: negative for bleeding, easy bruising and lymphadenopathy  Musculoskeletal:negative for arthralgias, muscle weakness  Neurological: negative for coordination problems, gait problems, headaches and weakness  Endocrine: negative for diabetic symptoms including polydipsia, polyuria and weight loss     Objective:   Physical Exam  Gen. Pleasant, obese, in no distress, normal affect ENT - no pallor,icterus, no post nasal drip, class 2-3 airway Neck: No JVD, no thyromegaly, no carotid bruits Lungs: no use of accessory muscles, no dullness to percussion, decreased without rales or rhonchi  Cardiovascular: Rhythm regular, heart sounds  normal, no murmurs or gallops, 1+ peripheral edema Abdomen: soft and non-tender, no hepatosplenomegaly, BS normal. Musculoskeletal: No deformities, no cyanosis or clubbing Neuro:  alert, non focal, no tremors Extremities-good pulse in both feet, erythema, no warmth 1+ edema       Assessment & Plan:   Stasis changes both lower extremities related to chronic pedal edema -She has good pulses doubt arterial circulation issue, likely venous insufficiency. I encouraged her to use compression stockings that she has as much as possible in the daytime and limb elevation when she sleeps or is resting

## 2021-10-19 LAB — BASIC METABOLIC PANEL
BUN/Creatinine Ratio: 11 — ABNORMAL LOW (ref 12–28)
BUN: 11 mg/dL (ref 8–27)
CO2: 30 mmol/L — ABNORMAL HIGH (ref 20–29)
Calcium: 9.9 mg/dL (ref 8.7–10.3)
Chloride: 95 mmol/L — ABNORMAL LOW (ref 96–106)
Creatinine, Ser: 1.03 mg/dL — ABNORMAL HIGH (ref 0.57–1.00)
Glucose: 110 mg/dL — ABNORMAL HIGH (ref 70–99)
Potassium: 4.7 mmol/L (ref 3.5–5.2)
Sodium: 137 mmol/L (ref 134–144)
eGFR: 55 mL/min/{1.73_m2} — ABNORMAL LOW (ref >59)

## 2021-10-22 DIAGNOSIS — Z20828 Contact with and (suspected) exposure to other viral communicable diseases: Secondary | ICD-10-CM | POA: Diagnosis not present

## 2021-10-22 DIAGNOSIS — Z1152 Encounter for screening for COVID-19: Secondary | ICD-10-CM | POA: Diagnosis not present

## 2021-10-28 ENCOUNTER — Other Ambulatory Visit: Payer: Self-pay

## 2021-10-28 ENCOUNTER — Ambulatory Visit (HOSPITAL_COMMUNITY)
Admission: RE | Admit: 2021-10-28 | Discharge: 2021-10-28 | Disposition: A | Discharge status: Home / Self Care | Payer: Medicare Other | Source: Ambulatory Visit | Attending: Pulmonary Disease | Admitting: Pulmonary Disease

## 2021-10-28 DIAGNOSIS — K224 Dyskinesia of esophagus: Secondary | ICD-10-CM | POA: Diagnosis not present

## 2021-10-28 DIAGNOSIS — R1319 Other dysphagia: Secondary | ICD-10-CM | POA: Diagnosis not present

## 2021-10-30 ENCOUNTER — Telehealth: Payer: Self-pay | Admitting: Pulmonary Disease

## 2021-10-30 ENCOUNTER — Other Ambulatory Visit: Payer: Self-pay

## 2021-10-30 DIAGNOSIS — R1319 Other dysphagia: Secondary | ICD-10-CM

## 2021-10-31 ENCOUNTER — Telehealth: Payer: Self-pay

## 2021-10-31 MED ORDER — POTASSIUM CHLORIDE CRYS ER 20 MEQ PO TBCR: 20.0000 meq | EXTENDED_RELEASE_TABLET | Freq: Every day | ORAL | 3 refills | Status: AC

## 2021-10-31 NOTE — Telephone Encounter (Signed)
Spoke to patient.  She stated that Megan relayed results to her son, Tawanna Cooler.  Nothing further needed at this time.

## 2021-10-31 NOTE — Telephone Encounter (Signed)
I spoke with Christina Frey ,(on DPR) , and she had already decreased potassium to 20 meq qd a couple of weeks ago

## 2021-10-31 NOTE — Telephone Encounter (Signed)
-----   Message from Ellsworth Lennox, [place] sent at 10/22/2021  8:34 AM EST ----- Thank you for the update!  Bradly Bienenstock, can you please call her and ask them to cut her K-dur down to 20 mEq ONCE DAILY.

## 2021-11-06 ENCOUNTER — Other Ambulatory Visit: Payer: Self-pay | Admitting: Cardiology

## 2021-11-06 NOTE — Telephone Encounter (Signed)
Prescription refill request for Eliquis received. ?Indication: Atrial fib ?Last office visit: 06/26/21  B Strader PA-C ?Scr: 1.03 on 10/18/21 ?Age: 81 ?Weight: 88.8kg ? ?Based on above findings Eliquis 5mg  twice daily is the appropriate dose.  Refill approved.  Pt has F/U appt with B Strader PA-C on 11/22/21. ? ?

## 2021-11-11 DIAGNOSIS — Z20822 Contact with and (suspected) exposure to covid-19: Secondary | ICD-10-CM | POA: Diagnosis not present

## 2021-11-21 NOTE — Progress Notes (Signed)
? ?Cardiology Office Note   ? ?Date:  11/23/2021  ? ?ID:  Christina Frey, DOB 08-May-1941, MRN [id number] ? ?PCP:  Redmond School, MD  ?Cardiologist: Rozann Lesches, MD   ? ?Chief Complaint  ?Patient presents with  ? Follow-up  ?  Routine Visit  ? ? ?History of Present Illness:   ? ?Christina Frey is a 81 y.o. female with past medical history of permanent atrial fibrillation, HFpEF, ASD, pulmonary HTN, HTN, Hypothyroidism and PUD who presents to the office today for overdue follow-up. ?  ?She was examined by myself in 06/2021 and reported a baseline weight in the 170's and was still approximately 20 pounds above her baseline. She did report worsening lower extremity edema but denied any significant changes in her respiratory status. She was switched from Lasix to Torsemide 20 mg twice daily and was encouraged to keep follow-up with Pulmonology in regards to upcoming sleep study. A 3-day Zio patch was also recommended to assess her PVC burden given frequent PVC's by prior EKG. Monitor did show she was in atrial fibrillation throughout the study and heart rate was well controlled in the 70's. Her PVC burden was only 2% and she was continued on Coreg. ? ?In talking with the patient and her son today, she reports her lower extremity edema improved following her last visit and switching to Torsemide. Does not elevate her legs routinely. Her family members report she is not active and sits a majority of the day. We reviewed ways to gradually increase her activity as she is becoming more deconditioned. She did gain 20 lbs over the course of several months last year but was started on Celexa, Cymbalta and Remeron during that timeframe. She denies any specific orthopnea, PND, chest pain or palpitations.  ? ? ?Past Medical History:  ?Diagnosis Date  ? ASD (atrial septal defect)   ? Chronic diastolic CHF (congestive heart failure) ([place])   ? Chronic pain   ? Essential hypertension   ? GERD (gastroesophageal reflux disease)   ?  Hyperlipidemia   ? Hypothyroidism   ? Mitral regurgitation   ? Obesity   ? OSA (obstructive sleep apnea)   ? Unable to wear CPAP  ? Osteoarthritis   ? Persistent atrial fibrillation ([place])   ? PUD (peptic ulcer disease)   ? Pulmonary hypertension ([place])   ? Tricuspid regurgitation   ? Type 2 diabetes mellitus ([place])   ? ? ?Past Surgical History:  ?Procedure Laterality Date  ? TOTAL KNEE ARTHROPLASTY    ? TUBAL LIGATION    ? ? ?Current Medications: ?Outpatient Medications Prior to Visit  ?Medication Sig Dispense Refill  ? carvedilol (COREG) 3.125 MG tablet Take 1 tablet (3.125 mg total) by mouth 2 (two) times daily. 180 tablet 3  ? citalopram (CELEXA) 20 MG tablet Take 10 mg by mouth daily.    ? DULoxetine (CYMBALTA) 30 MG capsule Take 30 mg by mouth 2 (two) times daily.    ? ELIQUIS 5 MG TABS tablet TAKE 1 TABLET BY MOUTH TWICE A DAY 60 tablet 6  ? gabapentin (NEURONTIN) 300 MG capsule Take 1 capsule by mouth 3 (three) times daily.     ? latanoprost (XALATAN) 0.005 % ophthalmic solution Place 1 drop into the left eye at bedtime.    ? latanoprost (XALATAN) 0.005 % ophthalmic solution PLACE 1 DROP INTO THE LEFT EYE NIGHTLY.    ? levothyroxine (SYNTHROID) 100 MCG tablet Take 100 mcg by mouth daily before breakfast.    ?  lisinopril (ZESTRIL) 5 MG tablet Take 5 mg by mouth daily.    ? mirtazapine (REMERON) 15 MG tablet Take 15 mg by mouth at bedtime.    ? oxybutynin (DITROPAN XL) 15 MG 24 hr tablet Take 15 mg by mouth at bedtime.    ? pantoprazole (PROTONIX) 40 MG tablet Take 40 mg by mouth daily.    ? potassium chloride SA (KLOR-CON M20) 20 MEQ tablet Take 1 tablet (20 mEq total) by mouth daily. 90 tablet 3  ? torsemide (DEMADEX) 20 MG tablet Take 1 tablet (20 mg total) by mouth 2 (two) times daily. 180 tablet 3  ? traMADol (ULTRAM) 50 MG tablet Take 50 mg by mouth as needed. (Patient not taking: Reported on 11/22/2021)    ? ?No facility-administered medications prior to visit.  ?  ? ?Allergies:   Other and Zetia  [ezetimibe]  ? ?Social History  ? ?Socioeconomic History  ? Marital status: Widowed  ?  Spouse name: Not on file  ? Number of children: Not on file  ? Years of education: Not on file  ? Highest education level: Not on file  ?Occupational History  ? Not on file  ?Tobacco Use  ? Smoking status: Never  ? Smokeless tobacco: Never  ?Vaping Use  ? Vaping Use: Never used  ?Substance and Sexual Activity  ? Alcohol use: No  ? Drug use: No  ? Sexual activity: Not on file  ?Other Topics Concern  ? Not on file  ?Social History Narrative  ? Not on file  ? ?Social Determinants of Health  ? ?Financial Resource Strain: Not on file  ?Food Insecurity: Not on file  ?Transportation Needs: Not on file  ?Physical Activity: Not on file  ?Stress: Not on file  ?Social Connections: Not on file  ?  ? ?Family History:  The patient's family history includes ALS in her brother; Arthritis in her brother; Diabetes in her sister; Heart disease in her brother; Hypertension in her brother.  ? ?Review of Systems:   ? ?Please see the history of present illness.    ? ?All other systems reviewed and are otherwise negative except as noted above. ? ? ?Physical Exam:   ? ?VS:  BP 108/72   Pulse 80   Ht 5\' 2"  (1.575 m)   Wt 200 lb (90.7 kg)   SpO2 98%   BMI 36.58 kg/m?    ?General: Pleasant elderly female appearing in no acute distress. ?Head: Normocephalic, atraumatic. ?Neck: No carotid bruits. JVD not elevated.  ?Lungs: Respirations regular and unlabored, without wheezes or rales.  ?Heart: Irregularly irregular. No S3 or S4. 2/6 SEM along LLSB.  ?Abdomen: Appears non-distended. No obvious abdominal masses. ?Msk:  Strength and tone appear normal for age. No obvious joint deformities or effusions. ?Extremities: No clubbing or cyanosis. Trace lower extremity edema.  Distal pedal pulses are 2+ bilaterally. ?Neuro: Alert and oriented X 3. Moves all extremities spontaneously. No focal deficits noted. ?Psych:  Responds to questions appropriately with a  normal affect. ?Skin: No rashes or lesions noted ? ?Wt Readings from Last 3 Encounters:  ?11/22/21 200 lb (90.7 kg)  ?10/18/21 199 lb (90.3 kg)  ?06/26/21 195 lb 12.8 oz (88.8 kg)  ?  ? ?Studies/Labs Reviewed:  ? ?EKG:  EKG is not ordered today.  ? ?Recent Labs: ?06/18/2021: B Natriuretic Peptide 291.0; Hemoglobin 11.4; Magnesium 2.0; Platelets 190; TSH 2.550 ?10/18/2021: BUN 11; Creatinine, Ser 1.03; Potassium 4.7; Sodium 137  ? ?Lipid Panel ?No results found for:  CHOL, TRIG, HDL, CHOLHDL, VLDL, LDLCALC, LDLDIRECT ? ?Additional studies/ records that were reviewed today include:  ? ?Echocardiogram: 06/2021 ?IMPRESSIONS  ? ? ? 1. Left ventricular ejection fraction, by estimation, is 65 to 70%. The  ?left ventricle has normal function. The left ventricle has no regional  ?wall motion abnormalities. Left ventricular diastolic parameters are  ?indeterminate. There is the  ?interventricular septum is flattened in systole and diastole, consistent  ?with right ventricular pressure and volume overload.  ? 2. Right ventricular systolic function is mildly reduced. The right  ?ventricular size is moderately enlarged. There is moderately elevated  ?pulmonary artery systolic pressure. The estimated right ventricular  ?systolic pressure is [id number] mmHg.  ? 3. Left atrial size was severely dilated.  ? 4. Right atrial size was severely dilated.  ? 5. A small pericardial effusion is present. The pericardial effusion is  ?posterior to the left ventricle.  ? 6. The mitral valve is abnormal. Mild mitral valve regurgitation.  ?Moderate mitral annular calcification.  ? 7. Tricuspid valve regurgitation is mild to moderate.  ? 8. The aortic valve is tricuspid. Aortic valve regurgitation is not  ?visualized. Mild to moderate aortic valve sclerosis/calcification is  ?present, without any evidence of aortic stenosis. Aortic valve mean  ?gradient measures 3.0 mmHg.  ? 9. The inferior vena cava is dilated in size with >50% respiratory   ?variability, suggesting right atrial pressure of 8 mmHg.  ?10. Evidence of atrial level shunting detected by color flow Doppler.  ?There is a moderately sized secundum atrial septal defect with  ?predominantly left to right shunti

## 2021-11-22 ENCOUNTER — Other Ambulatory Visit: Payer: Self-pay

## 2021-11-22 ENCOUNTER — Ambulatory Visit (INDEPENDENT_AMBULATORY_CARE_PROVIDER_SITE_OTHER): Payer: Medicare Other | Admitting: Student

## 2021-11-22 ENCOUNTER — Encounter: Payer: Self-pay | Admitting: Student

## 2021-11-22 VITALS — BP 108/72 | HR 80 | Ht 62.0 in | Wt 200.0 lb

## 2021-11-22 DIAGNOSIS — I272 Pulmonary hypertension, unspecified: Secondary | ICD-10-CM

## 2021-11-22 DIAGNOSIS — Q211 Atrial septal defect, unspecified: Secondary | ICD-10-CM | POA: Diagnosis not present

## 2021-11-22 DIAGNOSIS — I5032 Chronic diastolic (congestive) heart failure: Secondary | ICD-10-CM

## 2021-11-22 DIAGNOSIS — I10 Essential (primary) hypertension: Secondary | ICD-10-CM

## 2021-11-22 DIAGNOSIS — I4819 Other persistent atrial fibrillation: Secondary | ICD-10-CM | POA: Diagnosis not present

## 2021-11-22 NOTE — Patient Instructions (Signed)
Medication Instructions:  Your physician recommends that you continue on your current medications as directed. Please refer to the Current Medication list given to you today.  *If you need a refill on your cardiac medications before your next appointment, please call your pharmacy*   Lab Work: NONE   If you have labs (blood work) drawn today and your tests are completely normal, you will receive your results only by: . MyChart Message (if you have MyChart) OR . A paper copy in the mail If you have any lab test that is abnormal or we need to change your treatment, we will call you to review the results.   Testing/Procedures: NONE    Follow-Up: At [facility], you and your health needs are our priority.  As part of our continuing mission to provide you with exceptional heart care, we have created designated Provider Care Teams.  These Care Teams include your primary Cardiologist (physician) and Advanced Practice Providers (APPs -  Physician Assistants and Nurse Practitioners) who all work together to provide you with the care you need, when you need it.  We recommend signing up for the patient portal called "MyChart".  Sign up information is provided on this After Visit Summary.  MyChart is used to connect with patients for Virtual Visits (Telemedicine).  Patients are able to view lab/test results, encounter notes, upcoming appointments, etc.  Non-urgent messages can be sent to your provider as well.   To learn more about what you can do with MyChart, go to [email or web address].com.    Your next appointment:   6 month(s)  The format for your next appointment:   In Person  Provider:   Samuel McDowell, MD or Brittany Strader, PA-C   Other Instructions Thank you for choosing [facility]!    

## 2021-11-23 ENCOUNTER — Encounter: Payer: Self-pay | Admitting: Student

## 2021-11-26 DIAGNOSIS — R300 Dysuria: Secondary | ICD-10-CM | POA: Diagnosis not present

## 2021-11-26 DIAGNOSIS — Z6837 Body mass index (BMI) 37.0-37.9, adult: Secondary | ICD-10-CM | POA: Diagnosis not present

## 2021-11-26 DIAGNOSIS — E6609 Other obesity due to excess calories: Secondary | ICD-10-CM | POA: Diagnosis not present

## 2021-12-06 ENCOUNTER — Other Ambulatory Visit (HOSPITAL_COMMUNITY): Payer: Self-pay | Admitting: Internal Medicine

## 2021-12-06 ENCOUNTER — Ambulatory Visit: Payer: Medicare Other

## 2021-12-06 ENCOUNTER — Ambulatory Visit (HOSPITAL_COMMUNITY)
Admission: RE | Admit: 2021-12-06 | Discharge: 2021-12-06 | Disposition: A | Discharge status: Home / Self Care | Payer: Medicare Other | Source: Ambulatory Visit | Attending: Internal Medicine | Admitting: Internal Medicine

## 2021-12-06 DIAGNOSIS — E6609 Other obesity due to excess calories: Secondary | ICD-10-CM | POA: Diagnosis not present

## 2021-12-06 DIAGNOSIS — G4733 Obstructive sleep apnea (adult) (pediatric): Secondary | ICD-10-CM | POA: Diagnosis not present

## 2021-12-06 DIAGNOSIS — R0683 Snoring: Secondary | ICD-10-CM

## 2021-12-06 DIAGNOSIS — I5030 Unspecified diastolic (congestive) heart failure: Secondary | ICD-10-CM | POA: Diagnosis not present

## 2021-12-06 DIAGNOSIS — I10 Essential (primary) hypertension: Secondary | ICD-10-CM | POA: Diagnosis not present

## 2021-12-06 DIAGNOSIS — J811 Chronic pulmonary edema: Secondary | ICD-10-CM | POA: Diagnosis not present

## 2021-12-06 DIAGNOSIS — T50905A Adverse effect of unspecified drugs, medicaments and biological substances, initial encounter: Secondary | ICD-10-CM | POA: Diagnosis not present

## 2021-12-06 DIAGNOSIS — I5033 Acute on chronic diastolic (congestive) heart failure: Secondary | ICD-10-CM | POA: Diagnosis not present

## 2021-12-06 DIAGNOSIS — R0600 Dyspnea, unspecified: Secondary | ICD-10-CM | POA: Diagnosis not present

## 2021-12-06 DIAGNOSIS — R0902 Hypoxemia: Secondary | ICD-10-CM | POA: Diagnosis not present

## 2021-12-06 DIAGNOSIS — E039 Hypothyroidism, unspecified: Secondary | ICD-10-CM | POA: Diagnosis not present

## 2021-12-06 DIAGNOSIS — I517 Cardiomegaly: Secondary | ICD-10-CM | POA: Diagnosis not present

## 2021-12-06 DIAGNOSIS — I4891 Unspecified atrial fibrillation: Secondary | ICD-10-CM | POA: Diagnosis not present

## 2021-12-06 DIAGNOSIS — Z6838 Body mass index (BMI) 38.0-38.9, adult: Secondary | ICD-10-CM | POA: Diagnosis not present

## 2021-12-06 DIAGNOSIS — R7309 Other abnormal glucose: Secondary | ICD-10-CM | POA: Diagnosis not present

## 2021-12-12 ENCOUNTER — Telehealth: Payer: Self-pay | Admitting: Pulmonary Disease

## 2021-12-12 DIAGNOSIS — G4733 Obstructive sleep apnea (adult) (pediatric): Secondary | ICD-10-CM

## 2021-12-12 NOTE — Telephone Encounter (Signed)
HST showed mild  OSA with AHI 8/ hr ? ?But oxygen drop was severe ?Suggest CPAP titration study to decide level of CPAP + oxygen if needed ? ?

## 2021-12-12 NOTE — Telephone Encounter (Signed)
Called and spoke with patient's daughter in law Renee per DPR to let her know results of sleep study and that Dr. Vassie Loll would like to order additional testing. She expressed understanding and would like to proceed. Order has been placed. Nothing further needed at this time.  ?

## 2021-12-22 ENCOUNTER — Other Ambulatory Visit: Payer: Self-pay

## 2021-12-22 ENCOUNTER — Emergency Department (HOSPITAL_COMMUNITY): Payer: Medicare Other

## 2021-12-22 ENCOUNTER — Ambulatory Visit (HOSPITAL_COMMUNITY)
Admission: EM | Admit: 2021-12-22 | Discharge: 2021-12-23 | Disposition: A | Discharge status: Home / Self Care | Payer: Medicare Other | Attending: Emergency Medicine | Admitting: Emergency Medicine

## 2021-12-22 ENCOUNTER — Encounter (HOSPITAL_COMMUNITY): Payer: Self-pay

## 2021-12-22 DIAGNOSIS — T18108A Unspecified foreign body in esophagus causing other injury, initial encounter: Secondary | ICD-10-CM | POA: Diagnosis not present

## 2021-12-22 DIAGNOSIS — G4733 Obstructive sleep apnea (adult) (pediatric): Secondary | ICD-10-CM | POA: Insufficient documentation

## 2021-12-22 DIAGNOSIS — X58XXXA Exposure to other specified factors, initial encounter: Secondary | ICD-10-CM | POA: Insufficient documentation

## 2021-12-22 DIAGNOSIS — E785 Hyperlipidemia, unspecified: Secondary | ICD-10-CM | POA: Diagnosis not present

## 2021-12-22 DIAGNOSIS — K222 Esophageal obstruction: Secondary | ICD-10-CM

## 2021-12-22 DIAGNOSIS — R079 Chest pain, unspecified: Secondary | ICD-10-CM | POA: Diagnosis not present

## 2021-12-22 DIAGNOSIS — E119 Type 2 diabetes mellitus without complications: Secondary | ICD-10-CM | POA: Diagnosis not present

## 2021-12-22 DIAGNOSIS — J811 Chronic pulmonary edema: Secondary | ICD-10-CM | POA: Diagnosis not present

## 2021-12-22 DIAGNOSIS — E039 Hypothyroidism, unspecified: Secondary | ICD-10-CM | POA: Diagnosis not present

## 2021-12-22 DIAGNOSIS — I4819 Other persistent atrial fibrillation: Secondary | ICD-10-CM | POA: Diagnosis not present

## 2021-12-22 DIAGNOSIS — I10 Essential (primary) hypertension: Secondary | ICD-10-CM | POA: Diagnosis not present

## 2021-12-22 DIAGNOSIS — Z7901 Long term (current) use of anticoagulants: Secondary | ICD-10-CM | POA: Diagnosis not present

## 2021-12-22 DIAGNOSIS — K219 Gastro-esophageal reflux disease without esophagitis: Secondary | ICD-10-CM | POA: Diagnosis not present

## 2021-12-22 DIAGNOSIS — Z79890 Hormone replacement therapy (postmenopausal): Secondary | ICD-10-CM | POA: Insufficient documentation

## 2021-12-22 DIAGNOSIS — I110 Hypertensive heart disease with heart failure: Secondary | ICD-10-CM | POA: Diagnosis not present

## 2021-12-22 DIAGNOSIS — Z743 Need for continuous supervision: Secondary | ICD-10-CM | POA: Diagnosis not present

## 2021-12-22 DIAGNOSIS — E669 Obesity, unspecified: Secondary | ICD-10-CM | POA: Diagnosis not present

## 2021-12-22 DIAGNOSIS — I5032 Chronic diastolic (congestive) heart failure: Secondary | ICD-10-CM | POA: Insufficient documentation

## 2021-12-22 DIAGNOSIS — I517 Cardiomegaly: Secondary | ICD-10-CM | POA: Diagnosis not present

## 2021-12-22 DIAGNOSIS — R1310 Dysphagia, unspecified: Secondary | ICD-10-CM | POA: Diagnosis present

## 2021-12-22 DIAGNOSIS — Z79899 Other long term (current) drug therapy: Secondary | ICD-10-CM | POA: Insufficient documentation

## 2021-12-22 DIAGNOSIS — K2970 Gastritis, unspecified, without bleeding: Secondary | ICD-10-CM | POA: Insufficient documentation

## 2021-12-22 DIAGNOSIS — T18128A Food in esophagus causing other injury, initial encounter: Secondary | ICD-10-CM | POA: Diagnosis not present

## 2021-12-22 DIAGNOSIS — R0902 Hypoxemia: Secondary | ICD-10-CM | POA: Diagnosis not present

## 2021-12-22 MED ORDER — LORAZEPAM 2 MG/ML IJ SOLN
1.0000 mg | Freq: Once | INTRAMUSCULAR | Status: AC
  Administered 2021-12-23: 1 mg via INTRAVENOUS
  Filled 2021-12-22: qty 1

## 2021-12-22 MED ORDER — NITROGLYCERIN 0.4 MG SL SUBL
0.4000 mg | SUBLINGUAL_TABLET | Freq: Once | SUBLINGUAL | Status: AC
  Administered 2021-12-23: 0.4 mg via SUBLINGUAL
  Filled 2021-12-22: qty 1

## 2021-12-22 MED ORDER — GLUCAGON HCL RDNA (DIAGNOSTIC) 1 MG IJ SOLR
1.0000 mg | Freq: Once | INTRAMUSCULAR | Status: AC
  Administered 2021-12-23: 1 mg via INTRAVENOUS
  Filled 2021-12-22: qty 1

## 2021-12-22 NOTE — ED Triage Notes (Signed)
Pt arrived from home via [facility] w c/o sensation that her last bite of chicken is still in her throat. Pt is scheduled for esophageal stretching in May. ?

## 2021-12-22 NOTE — ED Provider Notes (Signed)
?[facility] ?Provider Note ? ? ?CSN: [id number] ?Arrival date & time: 12/22/21  2325 ? ?  ? ?History ? ?Chief Complaint  ?Patient presents with  ? esophageal dysphagia  ? ? ?CALYSSA YARDLEY is a 81 y.o. female. ? ?81 year old female who presents to the ER today secondary to esophageal pain.  Patient was eating chicken tonight and on the last bite she felt like it did not go down.  She felt like it was stuck in the top of her throat.  She later massaged it a little bit and then it seems like it stuck in the middle of her throat.  She has not been able to tolerate p.o. fluids or solids since then.  She is able to tolerate her secretions.  She is never had a problem with this before.  Cardiac problems in the past.  Has never seen a GI doctor. ? ? ? ?  ? ?Home Medications ?Prior to Admission medications   ?Medication Sig Start Date End Date Taking? Authorizing Provider  ?carvedilol (COREG) 3.125 MG tablet Take 1 tablet (3.125 mg total) by mouth 2 (two) times daily. 05/20/21 11/22/21  Satira Sark, MD  ?citalopram (CELEXA) 20 MG tablet Take 10 mg by mouth daily.    [provider]  ?DULoxetine (CYMBALTA) 30 MG capsule Take 30 mg by mouth 2 (two) times daily. 05/03/21   [provider]  ?ELIQUIS 5 MG TABS tablet TAKE 1 TABLET BY MOUTH TWICE A DAY 11/06/21   Satira Sark, MD  ?gabapentin (NEURONTIN) 300 MG capsule Take 1 capsule by mouth 3 (three) times daily.  10/22/16   [provider]  ?latanoprost (XALATAN) 0.005 % ophthalmic solution Place 1 drop into the left eye at bedtime. 04/23/21   [provider]  ?latanoprost (XALATAN) 0.005 % ophthalmic solution PLACE 1 DROP INTO THE LEFT EYE NIGHTLY. 06/25/21   [provider]  ?levothyroxine (SYNTHROID) 100 MCG tablet Take 100 mcg by mouth daily before breakfast.    [provider]  ?lisinopril (ZESTRIL) 5 MG tablet Take 5 mg by mouth daily.    [provider]  ?mirtazapine (REMERON) 15  MG tablet Take 15 mg by mouth at bedtime.    [provider]  ?oxybutynin (DITROPAN XL) 15 MG 24 hr tablet Take 15 mg by mouth at bedtime.    [provider]  ?pantoprazole (PROTONIX) 40 MG tablet Take 40 mg by mouth daily.    [provider]  ?potassium chloride SA (KLOR-CON M20) 20 MEQ tablet Take 1 tablet (20 mEq total) by mouth daily. 10/31/21 01/29/22  Strader, Fransisco Hertz, PA-C  ?torsemide (DEMADEX) 20 MG tablet Take 1 tablet (20 mg total) by mouth 2 (two) times daily. 06/26/21 11/22/21  Strader, Fransisco Hertz, PA-C  ?traMADol (ULTRAM) 50 MG tablet Take 50 mg by mouth as needed. ?Patient not taking: Reported on 11/22/2021 05/10/21   [provider]  ?   ? ?Allergies    ?Other and Zetia [ezetimibe]   ? ?Review of Systems   ?Review of Systems ? ?Physical Exam ?Updated Vital Signs ?BP 126/74   Pulse 73   Temp 97.9 ?F (36.6 ?C) (Oral)   Resp 16   SpO2 94%  ?Physical Exam ?Vitals and nursing note reviewed.  ?HENT:  ?   Nose: Nose normal. No congestion or rhinorrhea.  ?   Mouth/Throat:  ?   Mouth: Mucous membranes are moist.  ?   Pharynx: Oropharynx is clear.  ?Eyes:  ?  Pupils: Pupils are equal, round, and reactive to light.  ?Pulmonary:  ?   Effort: Pulmonary effort is normal.  ?Abdominal:  ?   General: Abdomen is flat.  ?Skin: ?   General: Skin is warm and dry.  ?   Coloration: Skin is not jaundiced or pale.  ?Neurological:  ?   General: No focal deficit present.  ?   Mental Status: She is oriented to person, place, and time.  ? ? ?ED Results / Procedures / Treatments   ?Labs ?(all labs ordered are listed, but only abnormal results are displayed) ?Labs Reviewed - No data to display ? ?EKG ?None ? ?Radiology ?DG Chest Portable 1 View ? ?Result Date: 12/23/2021 ?CLINICAL DATA:  Chest pain EXAM: PORTABLE CHEST 1 VIEW COMPARISON:  12/06/2021 FINDINGS: Lung volumes are small but are symmetric. Cardiomegaly is stable. Central pulmonary vascular congestion and superimposed interstitial  pulmonary infiltrate in keeping with pulmonary edema is stable, most in keeping with mild cardiogenic failure. No pneumothorax or pleural effusion. Osseous structures are age-appropriate IMPRESSION: Mild cardiogenic failure.  Stable cardiomegaly. Electronically Signed   By: Fidela Salisbury M.D.   On: 12/23/2021 00:23   ? ?Procedures ?Procedures  ? ? ?Medications Ordered in ED ?Medications  ?LORazepam (ATIVAN) injection 1 mg (1 mg Intravenous Given 12/23/21 0021)  ?nitroGLYCERIN (NITROSTAT) SL tablet 0.4 mg (0.4 mg Sublingual Given 12/23/21 0026)  ?glucagon (human recombinant) (GLUCAGEN) injection 1 mg (1 mg Intravenous Given 12/23/21 0025)  ?fentaNYL (SUBLIMAZE) injection 50 mcg (50 mcg Intravenous Given 12/23/21 0231)  ? ? ?ED Course/ Medical Decision Making/ A&P ?  ?                        ?Medical Decision Making ?Amount and/or Complexity of Data Reviewed ?Radiology: ordered. ? ?Risk ?Prescription drug management. ? ?Will attempt to get esophageal impaction resolved.  ?Meds didn't work. Will try cola ? ?Cola didn't work, d/w GI, Dr. Abbey Chatters, will see patient in AM for EGD. Around 0630. Should be stable for discharge afterwards.  ? ?Final Clinical Impression(s) / ED Diagnoses ?Final diagnoses:  ?Esophageal obstruction due to food impaction  ? ? ?Rx / DC Orders ?ED Discharge Orders   ? ? None  ? ?  ? ? ?  ?Merrily Pew, MD ?12/23/21 0321 ? ?

## 2021-12-23 ENCOUNTER — Other Ambulatory Visit: Payer: Self-pay

## 2021-12-23 ENCOUNTER — Encounter (HOSPITAL_COMMUNITY): Payer: Self-pay | Admitting: Emergency Medicine

## 2021-12-23 ENCOUNTER — Emergency Department (EMERGENCY_DEPARTMENT_HOSPITAL): Payer: Medicare Other | Admitting: Certified Registered"

## 2021-12-23 ENCOUNTER — Encounter (HOSPITAL_COMMUNITY)
Admission: EM | Disposition: A | Discharge status: Home / Self Care | Payer: Self-pay | Source: Home / Self Care | Attending: Emergency Medicine

## 2021-12-23 ENCOUNTER — Emergency Department (HOSPITAL_COMMUNITY): Payer: Medicare Other | Admitting: Certified Registered"

## 2021-12-23 DIAGNOSIS — I4819 Other persistent atrial fibrillation: Secondary | ICD-10-CM | POA: Diagnosis not present

## 2021-12-23 DIAGNOSIS — E785 Hyperlipidemia, unspecified: Secondary | ICD-10-CM | POA: Diagnosis not present

## 2021-12-23 DIAGNOSIS — T18128A Food in esophagus causing other injury, initial encounter: Secondary | ICD-10-CM

## 2021-12-23 DIAGNOSIS — I110 Hypertensive heart disease with heart failure: Secondary | ICD-10-CM | POA: Diagnosis not present

## 2021-12-23 DIAGNOSIS — E669 Obesity, unspecified: Secondary | ICD-10-CM | POA: Diagnosis not present

## 2021-12-23 DIAGNOSIS — I517 Cardiomegaly: Secondary | ICD-10-CM | POA: Diagnosis not present

## 2021-12-23 DIAGNOSIS — T18108A Unspecified foreign body in esophagus causing other injury, initial encounter: Secondary | ICD-10-CM | POA: Diagnosis not present

## 2021-12-23 DIAGNOSIS — K222 Esophageal obstruction: Secondary | ICD-10-CM

## 2021-12-23 DIAGNOSIS — I5032 Chronic diastolic (congestive) heart failure: Secondary | ICD-10-CM | POA: Diagnosis not present

## 2021-12-23 DIAGNOSIS — E039 Hypothyroidism, unspecified: Secondary | ICD-10-CM | POA: Diagnosis not present

## 2021-12-23 DIAGNOSIS — R079 Chest pain, unspecified: Secondary | ICD-10-CM | POA: Diagnosis not present

## 2021-12-23 DIAGNOSIS — K2970 Gastritis, unspecified, without bleeding: Secondary | ICD-10-CM

## 2021-12-23 DIAGNOSIS — E119 Type 2 diabetes mellitus without complications: Secondary | ICD-10-CM | POA: Diagnosis not present

## 2021-12-23 DIAGNOSIS — I10 Essential (primary) hypertension: Secondary | ICD-10-CM | POA: Diagnosis not present

## 2021-12-23 DIAGNOSIS — Z7901 Long term (current) use of anticoagulants: Secondary | ICD-10-CM | POA: Diagnosis not present

## 2021-12-23 DIAGNOSIS — J811 Chronic pulmonary edema: Secondary | ICD-10-CM | POA: Diagnosis not present

## 2021-12-23 DIAGNOSIS — K219 Gastro-esophageal reflux disease without esophagitis: Secondary | ICD-10-CM | POA: Diagnosis not present

## 2021-12-23 DIAGNOSIS — G4733 Obstructive sleep apnea (adult) (pediatric): Secondary | ICD-10-CM | POA: Diagnosis not present

## 2021-12-23 HISTORY — PX: ESOPHAGOGASTRODUODENOSCOPY (EGD) WITH PROPOFOL: SHX5813

## 2021-12-23 SURGERY — ESOPHAGOGASTRODUODENOSCOPY (EGD) WITH PROPOFOL
Anesthesia: General

## 2021-12-23 MED ORDER — SUCCINYLCHOLINE CHLORIDE 200 MG/10ML IV SOSY
PREFILLED_SYRINGE | INTRAVENOUS | Status: AC
  Filled 2021-12-23: qty 10

## 2021-12-23 MED ORDER — FENTANYL CITRATE PF 50 MCG/ML IJ SOSY
50.0000 ug | PREFILLED_SYRINGE | Freq: Once | INTRAMUSCULAR | Status: AC
  Administered 2021-12-23: 50 ug via INTRAVENOUS
  Filled 2021-12-23: qty 1

## 2021-12-23 MED ORDER — PHENYLEPHRINE 40 MCG/ML (10ML) SYRINGE FOR IV PUSH (FOR BLOOD PRESSURE SUPPORT)
PREFILLED_SYRINGE | INTRAVENOUS | Status: DC | PRN
  Administered 2021-12-23 (×2): 80 ug via INTRAVENOUS

## 2021-12-23 MED ORDER — LIDOCAINE 2% (20 MG/ML) 5 ML SYRINGE
INTRAMUSCULAR | Status: DC | PRN
Start: 2021-12-23 — End: 2021-12-23
  Administered 2021-12-23: 100 mg via INTRAVENOUS

## 2021-12-23 MED ORDER — DEXAMETHASONE SODIUM PHOSPHATE 4 MG/ML IJ SOLN
INTRAMUSCULAR | Status: AC
  Filled 2021-12-23: qty 1

## 2021-12-23 MED ORDER — DEXAMETHASONE SODIUM PHOSPHATE 4 MG/ML IJ SOLN
INTRAMUSCULAR | Status: DC | PRN
  Administered 2021-12-23: 4 mg via INTRAVENOUS

## 2021-12-23 MED ORDER — PHENYLEPHRINE 40 MCG/ML (10ML) SYRINGE FOR IV PUSH (FOR BLOOD PRESSURE SUPPORT)
PREFILLED_SYRINGE | INTRAVENOUS | Status: AC
  Filled 2021-12-23: qty 10

## 2021-12-23 MED ORDER — ONDANSETRON HCL 4 MG/2ML IJ SOLN
INTRAMUSCULAR | Status: DC | PRN
  Administered 2021-12-23: 4 mg via INTRAVENOUS

## 2021-12-23 MED ORDER — LIDOCAINE HCL (PF) 2 % IJ SOLN
INTRAMUSCULAR | Status: AC
Start: 2021-12-23 — End: ?
  Filled 2021-12-23: qty 5

## 2021-12-23 MED ORDER — EPHEDRINE 5 MG/ML INJ
INTRAVENOUS | Status: AC
  Filled 2021-12-23: qty 5

## 2021-12-23 MED ORDER — PANTOPRAZOLE SODIUM 40 MG PO TBEC: 40.0000 mg | DELAYED_RELEASE_TABLET | Freq: Two times a day (BID) | ORAL | 5 refills | Status: DC

## 2021-12-23 MED ORDER — SODIUM CHLORIDE 0.9 % IV SOLN: INTRAVENOUS | Status: DC

## 2021-12-23 MED ORDER — FENTANYL CITRATE (PF) 100 MCG/2ML IJ SOLN
INTRAMUSCULAR | Status: AC
  Filled 2021-12-23: qty 2

## 2021-12-23 MED ORDER — EPHEDRINE SULFATE-NACL 50-0.9 MG/10ML-% IV SOSY
PREFILLED_SYRINGE | INTRAVENOUS | Status: DC | PRN
  Administered 2021-12-23: 5 mg via INTRAVENOUS

## 2021-12-23 MED ORDER — ONDANSETRON HCL 4 MG/2ML IJ SOLN
INTRAMUSCULAR | Status: AC
Start: 2021-12-23 — End: ?
  Filled 2021-12-23: qty 2

## 2021-12-23 MED ORDER — FENTANYL CITRATE (PF) 250 MCG/5ML IJ SOLN
INTRAMUSCULAR | Status: DC | PRN
  Administered 2021-12-23: 50 ug via INTRAVENOUS

## 2021-12-23 MED ORDER — SUCCINYLCHOLINE CHLORIDE 200 MG/10ML IV SOSY
PREFILLED_SYRINGE | INTRAVENOUS | Status: DC | PRN
  Administered 2021-12-23: 100 mg via INTRAVENOUS

## 2021-12-23 MED ORDER — LACTATED RINGERS IV SOLN: INTRAVENOUS | Status: DC

## 2021-12-23 MED ORDER — PROPOFOL 10 MG/ML IV BOLUS
INTRAVENOUS | Status: DC | PRN
  Administered 2021-12-23: 120 mg via INTRAVENOUS
  Administered 2021-12-23: 50 mg via INTRAVENOUS

## 2021-12-23 NOTE — ED Notes (Signed)
Dr Marletta Lor paged to Dr Clayborne Dana @ [phone] ?

## 2021-12-23 NOTE — Op Note (Signed)
[facility] ?Patient Name: Christina Frey ?Procedure Date: 12/23/2021 7:20 AM ?MRN: [id number] ?Date of Birth: [birth year] ?Attending MD: Hennie Duos. Marletta Lor , DO ?CSN: [id number] ?Age: 81 ?Admit Type: Inpatient ?Procedure:                Upper GI endoscopy ?Indications:              Foreign body in the esophagus ?Providers:                Hennie Duos. Marletta Lor, DO, Jannett Celestine, RN, Crisann  ?                          Donnie Aho, Technician ?Referring MD:              ?Medicines:                See the Anesthesia note for documentation of the  ?                          administered medications ?Complications:            No immediate complications. ?Estimated Blood Loss:     Estimated blood loss was minimal. ?Procedure:                Pre-Anesthesia Assessment: ?                          - The anesthesia plan was to use general anesthesia. ?                          After obtaining informed consent, the endoscope was  ?                          passed under direct vision. Throughout the  ?                          procedure, the patient's blood pressure, pulse, and  ?                          oxygen saturations were monitored continuously. The  ?                          GIF-H190 ([id number]) scope was introduced through the  ?                          mouth, and advanced to the second part of duodenum.  ?                          The upper GI endoscopy was accomplished without  ?                          difficulty. The patient tolerated the procedure  ?                          well. ?Scope In: 7:35:45 AM ?Scope Out: 8:00:32 AM ?Total Procedure Duration: 0 hours 24 minutes 47 seconds  ?Findings: ?     Food was found at the cricopharyngeus. Removal  of food was accomplished.  ?     Food debris was removed with rat tooth grasper. After half of food bolus  ?     removed, I was able to gently push rest of food debris past stricture in  ?     proximal esophagus and into stomach with endoscope. ?     Localized mild inflammation  characterized by erythema was found in the  ?     gastric antrum. ?     The duodenal bulb, first portion of the duodenum and second portion of  ?     the duodenum were normal. ?Impression:               - Food at the cricopharyngeus. Removal was  ?                          successful. ?                          - Gastritis. ?                          - Normal duodenal bulb, first portion of the  ?                          duodenum and second portion of the duodenum. ?Moderate Sedation: ?     Per Anesthesia Care ?Recommendation:           - Patient has a contact number available for  ?                          emergencies. The signs and symptoms of potential  ?                          delayed complications were discussed with the  ?                          patient. Return to normal activities tomorrow.  ?                          Written discharge instructions were provided to the  ?                          patient. ?                          - Mechanical soft diet. ?                          - Use Protonix (pantoprazole) 40 mg PO BID. ?                          - Return to GI office in 6 weeks. ?Procedure Code(s):        --- Professional --- ?                          [phone], Esophagogastroduodenoscopy, flexible,  ?  transoral; with removal of foreign body(s) ?Diagnosis Code(s):        --- Professional --- ?                          O15.615P, Food in esophagus causing other injury,  ?                          initial encounter ?                          K29.70, Gastritis, unspecified, without bleeding ?                          T18.108A, Unspecified foreign body in esophagus  ?                          causing other injury, initial encounter ?CPT copyright 2019 [employer]. All rights reserved. ?The codes documented in this report are preliminary and upon coder review may  ?be revised to meet current compliance requirements. ?Hennie Duos. Marletta Lor, DO ?Hennie Duos. Meridian Scherger,  DO ?12/23/2021 8:11:23 AM ?This report has been signed electronically. ?Number of Addenda: 0 ?

## 2021-12-23 NOTE — Transfer of Care (Signed)
Immediate Anesthesia Transfer of Care Note ? ?Patient: Christina Frey ? ?Procedure(s) Performed: ESOPHAGOGASTRODUODENOSCOPY (EGD) WITH PROPOFOL ? ?Patient Location: PACU ? ?Anesthesia Type:General ? ?Level of Consciousness: awake ? ?Airway & Oxygen Therapy: Patient Spontanous Breathing and Patient connected to face mask oxygen ? ?Post-op Assessment: Report given to RN and Post -op Vital signs reviewed and stable ? ?Post vital signs: Reviewed and stable ? ?Last Vitals:  ?Vitals Value Taken Time  ?BP 127/83 12/23/21 0806  ?Temp    ?Pulse 91 12/23/21 0811  ?Resp 24 12/23/21 0811  ?SpO2 94 % 12/23/21 0811  ?Vitals shown include unvalidated device data. ? ?Last Pain:  ?Vitals:  ? 12/23/21 0730  ?TempSrc:   ?PainSc: 7   ?   ? ?  ? ?Complications: No notable events documented. ?

## 2021-12-23 NOTE — Anesthesia Postprocedure Evaluation (Signed)
Anesthesia Post Note ? ?Patient: Christina Frey ? ?Procedure(s) Performed: ESOPHAGOGASTRODUODENOSCOPY (EGD) WITH PROPOFOL ? ?Patient location during evaluation: Phase II ?Anesthesia Type: General ?Level of consciousness: awake ?Pain management: pain level controlled ?Vital Signs Assessment: post-procedure vital signs reviewed and stable ?Respiratory status: spontaneous breathing and respiratory function stable ?Cardiovascular status: blood pressure returned to baseline and stable ?Postop Assessment: no headache and no apparent nausea or vomiting ?Anesthetic complications: no ?Comments: Late entry ? ? ?No notable events documented. ? ? ?Last Vitals:  ?Vitals:  ? 12/23/21 0815 12/23/21 0834  ?BP: 132/82 127/61  ?Pulse: 95 84  ?Resp: (!) 22 18  ?Temp:  36.7 ?C  ?SpO2: 96% 95%  ?  ?Last Pain:  ?Vitals:  ? 12/23/21 0834  ?TempSrc: Oral  ?PainSc: 0-No pain  ? ? ?  ?  ?  ?  ?  ?  ? ?Louann Sjogren ? ? ? ? ?

## 2021-12-23 NOTE — Discharge Instructions (Addendum)
EGD ?Discharge instructions ?Please read the instructions outlined below and refer to this sheet in the next few weeks. These discharge instructions provide you with general information on caring for yourself after you leave the hospital. Your doctor may also give you specific instructions. While your treatment has been planned according to the most current medical practices available, unavoidable complications occasionally occur. If you have any problems or questions after discharge, please call your doctor. ?ACTIVITY ?You may resume your regular activity but move at a slower pace for the next 24 hours.  ?Take frequent rest periods for the next 24 hours.  ?Walking will help expel (get rid of) the air and reduce the bloated feeling in your abdomen.  ?No driving for 24 hours (because of the anesthesia (medicine) used during the test).  ?You may shower.  ?Do not sign any important legal documents or operate any machinery for 24 hours (because of the anesthesia used during the test).  ?NUTRITION ?Drink plenty of fluids.  ?You may resume your normal diet.  ?Begin with a light meal and progress to your normal diet.  ?Avoid alcoholic beverages for 24 hours or as instructed by your caregiver.  ?MEDICATIONS ?You may resume your normal medications unless your caregiver tells you otherwise.  ?WHAT YOU CAN EXPECT TODAY ?You may experience abdominal discomfort such as a feeling of fullness or ?gas? pains.  ?FOLLOW-UP ?Your doctor will discuss the results of your test with you.  ?SEEK IMMEDIATE MEDICAL ATTENTION IF ANY OF THE FOLLOWING OCCUR: ?Excessive nausea (feeling sick to your stomach) and/or vomiting.  ?Severe abdominal pain and distention (swelling).  ?Trouble swallowing.  ?Temperature over 101? F (37.8? C).  ?Rectal bleeding or vomiting of blood.  ? ? ?You had a large amount of food in the first portion of your esophagus.  I was able to remove this successfully.  I am going to increase your pantoprazole to twice daily.   Appears that you have a stricture in this area.  I would recommend you avoid tough textures. All meats should be chopped finely. Eat slowly, take small bites, chew thoroughly, and drink plenty of liquids throughout meals.  Follow-up with GI in 6 to 8 weeks. ?Resume Eliquis 12/24/21 ? ?I hope you have a great rest of your week! ? ?Christina Frey. Abbey Chatters, D.O. ?Gastroenterology and Hepatology ?[facility] ? ?

## 2021-12-23 NOTE — Anesthesia Procedure Notes (Signed)
Procedure Name: Intubation ?Date/Time: 12/23/2021 7:31 AM ?Performed by: Orlie Dakin, CRNA ?Pre-anesthesia Checklist: Patient identified, Emergency Drugs available, Suction available and Patient being monitored ?Patient Re-evaluated:Patient Re-evaluated prior to induction ?Oxygen Delivery Method: Circle system utilized ?Preoxygenation: Pre-oxygenation with 100% oxygen ?Induction Type: IV induction, Rapid sequence and Cricoid Pressure applied ?Laryngoscope Size: Glidescope and 3 ?Grade View: Grade I ?Tube type: Oral ?Tube size: 7.5 mm ?Number of attempts: 1 ?Airway Equipment and Method: Rigid stylet and Video-laryngoscopy ?Placement Confirmation: ETT inserted through vocal cords under direct vision, positive ETCO2 and breath sounds checked- equal and bilateral ?Secured at: 22 cm ?Tube secured with: Tape ?Dental Injury: Teeth and Oropharynx as per pre-operative assessment  ? ? ? ? ?

## 2021-12-23 NOTE — Anesthesia Preprocedure Evaluation (Addendum)
Anesthesia Evaluation  ?Patient identified by MRN, date of birth, ID band ?Patient awake ? ? ? ?Reviewed: ?Allergy & Precautions, H&P , NPO status , Patient's Chart, lab work & pertinent test results, reviewed documented beta blocker date and time  ? ?Airway ?Mallampati: II ? ?TM Distance: >3 FB ?Neck ROM: full ? ? ? Dental ?no notable dental hx. ? ?  ?Pulmonary ?sleep apnea ,  ?  ?Pulmonary exam normal ?breath sounds clear to auscultation ? ? ? ? ? ? Cardiovascular ?Exercise Tolerance: Good ?hypertension, negative cardio ROS ? ? ?Rhythm:regular Rate:Normal ? ? ?  ?Neuro/Psych ?negative neurological ROS ? negative psych ROS  ? GI/Hepatic ?Neg liver ROS, PUD, GERD  Medicated,  ?Endo/Other  ?diabetes, Type 2Hypothyroidism  ? Renal/GU ?negative Renal ROS  ?negative genitourinary ?  ?Musculoskeletal ? ? Abdominal ?  ?Peds ? Hematology ?negative hematology ROS ?(+)   ?Anesthesia Other Findings ? ? Reproductive/Obstetrics ?negative OB ROS ? ?  ? ? ? ? ? ? ? ? ? ? ? ? ? ?  ?  ? ? ? ? ? ? ? ? ?Anesthesia Physical ?Anesthesia Plan ? ?ASA: 3 and emergent ? ?Anesthesia Plan: General and General ETT  ? ?Post-op Pain Management:   ? ?Induction:  ? ?PONV Risk Score and Plan: Ondansetron ? ?Airway Management Planned:  ? ?Additional Equipment:  ? ?Intra-op Plan:  ? ?Post-operative Plan:  ? ?Informed Consent: I have reviewed the patients History and Physical, chart, labs and discussed the procedure including the risks, benefits and alternatives for the proposed anesthesia with the patient or authorized representative who has indicated his/her understanding and acceptance.  ? ? ? ?Dental Advisory Given ? ?Plan Discussed with: CRNA ? ?Anesthesia Plan Comments:   ? ? ? ? ? ?Anesthesia Quick Evaluation ? ?

## 2021-12-23 NOTE — Progress Notes (Signed)
Spoke with pt's son, Tawanna Cooler, for the second time since pt arrived in phase 2.  States it will be another before he can pick pt up.  Informed pt.  ?

## 2021-12-23 NOTE — H&P (Signed)
?Consulting  Provider: Dr. Dayna Barker ?Primary Care Physician:  Redmond School, MD ?Primary Gastroenterologist: None  ? ?Reason for Consultation: esophageal food impaction ? ?HPI:  ?Christina Frey is a 81 y.o. female with a past medical history of CHF, GERD, who presented to [facility], ER early this morning with chief complaint of esophageal food impaction.  Patient states she was eating chicken last night when she felt like it got stuck in her substernal region.  Unable to relieve obstruction with fluids.  Unable to tolerate any liquids without regurgitation.  Unable to tolerate secretions.  Some mild epigastric discomfort.  No shortness of breath.  Given glucagon in the ER without relief of her symptoms. ? ?Past Medical History:  ?Diagnosis Date  ? ASD (atrial septal defect)   ? Chronic diastolic CHF (congestive heart failure) ([place])   ? Chronic pain   ? Essential hypertension   ? GERD (gastroesophageal reflux disease)   ? Hyperlipidemia   ? Hypothyroidism   ? Mitral regurgitation   ? Obesity   ? OSA (obstructive sleep apnea)   ? Unable to wear CPAP  ? Osteoarthritis   ? Persistent atrial fibrillation ([place])   ? PUD (peptic ulcer disease)   ? Pulmonary hypertension ([place])   ? Tricuspid regurgitation   ? Type 2 diabetes mellitus ([place])   ? ? ?Past Surgical History:  ?Procedure Laterality Date  ? TOTAL KNEE ARTHROPLASTY    ? TUBAL LIGATION    ? ? ?Prior to Admission medications   ?Medication Sig Start Date End Date Taking? Authorizing Provider  ?citalopram (CELEXA) 20 MG tablet Take 10 mg by mouth daily.   Yes [provider]  ?DULoxetine (CYMBALTA) 30 MG capsule Take 30 mg by mouth 2 (two) times daily. 05/03/21  Yes [provider]  ?ELIQUIS 5 MG TABS tablet TAKE 1 TABLET BY MOUTH TWICE A DAY 11/06/21  Yes Satira Sark, MD  ?gabapentin (NEURONTIN) 300 MG capsule Take 1 capsule by mouth 3 (three) times daily.  10/22/16  Yes [provider]  ?latanoprost (XALATAN) 0.005 % ophthalmic solution  Place 1 drop into the left eye at bedtime. 04/23/21  Yes [provider]  ?latanoprost (XALATAN) 0.005 % ophthalmic solution PLACE 1 DROP INTO THE LEFT EYE NIGHTLY. 06/25/21  Yes [provider]  ?levothyroxine (SYNTHROID) 100 MCG tablet Take 100 mcg by mouth daily before breakfast.   Yes [provider]  ?lisinopril (ZESTRIL) 5 MG tablet Take 5 mg by mouth daily.   Yes [provider]  ?mirtazapine (REMERON) 15 MG tablet Take 15 mg by mouth at bedtime.   Yes [provider]  ?oxybutynin (DITROPAN XL) 15 MG 24 hr tablet Take 15 mg by mouth at bedtime.   Yes [provider]  ?pantoprazole (PROTONIX) 40 MG tablet Take 40 mg by mouth daily.   Yes [provider]  ?potassium chloride SA (KLOR-CON M20) 20 MEQ tablet Take 1 tablet (20 mEq total) by mouth daily. 10/31/21 01/29/22 Yes Strader, Fransisco Hertz, PA-C  ?traMADol (ULTRAM) 50 MG tablet Take 50 mg by mouth as needed. 05/10/21  Yes [provider]  ?carvedilol (COREG) 3.125 MG tablet Take 1 tablet (3.125 mg total) by mouth 2 (two) times daily. 05/20/21 11/22/21  Satira Sark, MD  ?torsemide (DEMADEX) 20 MG tablet Take 1 tablet (20 mg total) by mouth 2 (two) times daily. 06/26/21 11/22/21  Erma Heritage, PA-C  ? ? ?Current Facility-Administered Medications  ?Medication Dose Route Frequency Provider Last Rate Last  Admin  ? 0.9 %  sodium chloride infusion   Intravenous Continuous Hurshel Keys K, DO      ? lactated ringers infusion   Intravenous Continuous Louann Sjogren, MD 10 mL/hr at 12/23/21 0717 New Bag at 12/23/21 0717  ? ? ?Allergies as of 12/22/2021 - Review Complete 12/22/2021  ?Allergen Reaction Noted  ? Other  10/03/2015  ? Zetia [ezetimibe] Other (See Comments) 10/03/2015  ? ? ?Family History  ?Problem Relation Age of Onset  ? ALS Brother   ? Heart disease Brother   ? Hypertension Brother   ? Arthritis Brother   ? Diabetes Sister   ? ? ?Social History  ? ?Socioeconomic History  ?  Marital status: Widowed  ?  Spouse name: Not on file  ? Number of children: Not on file  ? Years of education: Not on file  ? Highest education level: Not on file  ?Occupational History  ? Not on file  ?Tobacco Use  ? Smoking status: Never  ? Smokeless tobacco: Never  ?Vaping Use  ? Vaping Use: Never used  ?Substance and Sexual Activity  ? Alcohol use: No  ? Drug use: No  ? Sexual activity: Not on file  ?Other Topics Concern  ? Not on file  ?Social History Narrative  ? Not on file  ? ?Social Determinants of Health  ? ?Financial Resource Strain: Not on file  ?Food Insecurity: Not on file  ?Transportation Needs: Not on file  ?Physical Activity: Not on file  ?Stress: Not on file  ?Social Connections: Not on file  ?Intimate Partner Violence: Not on file  ? ? ?Review of Systems: ?General: Negative for anorexia, weight loss, fever, chills, fatigue, weakness. ?Eyes: Negative for vision changes.  ?ENT: Negative for hoarseness, difficulty swallowing , nasal congestion. ?CV: Negative for chest pain, angina, palpitations, dyspnea on exertion, peripheral edema.  ?Respiratory: Negative for dyspnea at rest, dyspnea on exertion, cough, sputum, wheezing.  ?GI: See history of present illness. ?GU:  Negative for dysuria, hematuria, urinary incontinence, urinary frequency, nocturnal urination.  ?MS: Negative for joint pain, low back pain.  ?Derm: Negative for rash or itching.  ?Neuro: Negative for weakness, abnormal sensation, seizure, frequent headaches, memory loss, confusion.  ?Psych: Negative for anxiety, depression ?Endo: Negative for unusual weight change.  ?Heme: Negative for bruising or bleeding. ?Allergy: Negative for rash or hives. ? ?Physical Exam: ?Vital signs in last 24 hours: ?Temp:  [97.9 ?F (36.6 ?C)-98.3 ?F (36.8 ?C)] 98.3 ?F (36.8 ?C) (04/17 [id number]) ?Pulse Rate:  [65-86] 75 (04/17 0709) ?Resp:  [16] 16 (04/17 0709) ?BP: (91-139)/(46-92) 109/92 (04/17 0709) ?SpO2:  [83 %-100 %] 93 % (04/17 0709) ?  ?General:   Alert,   Well-developed, well-nourished, pleasant and cooperative in NAD ?Head:  Normocephalic and atraumatic. ?Eyes:  Sclera clear, no icterus.   Conjunctiva pink. ?Ears:  Normal auditory acuity. ?Nose:  No deformity, discharge,  or lesions. ?Mouth:  No deformity or lesions, dentition normal. ?Neck:  Supple; no masses or thyromegaly. ?Lungs:  Clear throughout to auscultation.   No wheezes, crackles, or rhonchi. No acute distress. ?Heart:  Regular rate and rhythm; no murmurs, clicks, rubs,  or gallops. ?Abdomen:  Soft, nontender and nondistended. No masses, hepatosplenomegaly or hernias noted. Normal bowel sounds, without guarding, and without rebound.   ?Msk:  Symmetrical without gross deformities. Normal posture. ?Pulses:  Normal pulses noted. ?Extremities:  Without clubbing or edema. ?Neurologic:  Alert and  oriented x4;  grossly normal neurologically. ?Skin:  Intact without significant  lesions or rashes. ?Cervical Nodes:  No significant cervical adenopathy. ?Psych:  Alert and cooperative. Normal mood and affect. ? ?Intake/Output from previous day: ?No intake/output data recorded. ?Intake/Output this shift: ?No intake/output data recorded. ? ?Lab Results: ?No results for input(s): WBC, HGB, HCT, PLT in the last 72 hours. ?BMET ?No results for input(s): NA, K, CL, CO2, GLUCOSE, BUN, CREATININE, CALCIUM in the last 72 hours. ?LFT ?No results for input(s): PROT, ALBUMIN, AST, ALT, ALKPHOS, BILITOT, BILIDIR, IBILI in the last 72 hours. ?PT/INR ?No results for input(s): LABPROT, INR in the last 72 hours. ?Hepatitis Panel ?No results for input(s): HEPBSAG, HCVAB, HEPAIGM, HEPBIGM in the last 72 hours. ?C-Diff ?No results for input(s): CDIFFTOX in the last 72 hours. ? ?Studies/Results: ?DG Chest Portable 1 View ? ?Result Date: 12/23/2021 ?CLINICAL DATA:  Chest pain EXAM: PORTABLE CHEST 1 VIEW COMPARISON:  12/06/2021 FINDINGS: Lung volumes are small but are symmetric. Cardiomegaly is stable. Central pulmonary vascular congestion  and superimposed interstitial pulmonary infiltrate in keeping with pulmonary edema is stable, most in keeping with mild cardiogenic failure. No pneumothorax or pleural effusion. Osseous structures are age

## 2021-12-26 ENCOUNTER — Encounter (HOSPITAL_COMMUNITY): Payer: Self-pay | Admitting: Internal Medicine

## 2021-12-26 DIAGNOSIS — Z20822 Contact with and (suspected) exposure to covid-19: Secondary | ICD-10-CM | POA: Diagnosis not present

## 2021-12-30 ENCOUNTER — Emergency Department (HOSPITAL_COMMUNITY)
Admission: EM | Admit: 2021-12-30 | Discharge: 2021-12-30 | Disposition: A | Discharge status: Home / Self Care | Payer: Medicare Other | Attending: Emergency Medicine | Admitting: Emergency Medicine

## 2021-12-30 ENCOUNTER — Other Ambulatory Visit: Payer: Self-pay

## 2021-12-30 ENCOUNTER — Emergency Department (EMERGENCY_DEPARTMENT_HOSPITAL): Payer: Medicare Other | Admitting: Anesthesiology

## 2021-12-30 ENCOUNTER — Emergency Department (HOSPITAL_COMMUNITY): Payer: Medicare Other | Admitting: Anesthesiology

## 2021-12-30 ENCOUNTER — Encounter (HOSPITAL_COMMUNITY)
Admission: EM | Disposition: A | Discharge status: Home / Self Care | Payer: Self-pay | Source: Home / Self Care | Attending: Emergency Medicine

## 2021-12-30 ENCOUNTER — Encounter (HOSPITAL_COMMUNITY): Payer: Self-pay | Admitting: *Deleted

## 2021-12-30 DIAGNOSIS — I110 Hypertensive heart disease with heart failure: Secondary | ICD-10-CM | POA: Insufficient documentation

## 2021-12-30 DIAGNOSIS — I509 Heart failure, unspecified: Secondary | ICD-10-CM | POA: Insufficient documentation

## 2021-12-30 DIAGNOSIS — Z7902 Long term (current) use of antithrombotics/antiplatelets: Secondary | ICD-10-CM | POA: Diagnosis not present

## 2021-12-30 DIAGNOSIS — K222 Esophageal obstruction: Secondary | ICD-10-CM

## 2021-12-30 DIAGNOSIS — Z7984 Long term (current) use of oral hypoglycemic drugs: Secondary | ICD-10-CM

## 2021-12-30 DIAGNOSIS — W44F3XA Food entering into or through a natural orifice, initial encounter: Secondary | ICD-10-CM | POA: Insufficient documentation

## 2021-12-30 DIAGNOSIS — Z79899 Other long term (current) drug therapy: Secondary | ICD-10-CM | POA: Insufficient documentation

## 2021-12-30 DIAGNOSIS — T18128A Food in esophagus causing other injury, initial encounter: Secondary | ICD-10-CM

## 2021-12-30 DIAGNOSIS — I4891 Unspecified atrial fibrillation: Secondary | ICD-10-CM | POA: Diagnosis not present

## 2021-12-30 DIAGNOSIS — E119 Type 2 diabetes mellitus without complications: Secondary | ICD-10-CM

## 2021-12-30 DIAGNOSIS — E039 Hypothyroidism, unspecified: Secondary | ICD-10-CM | POA: Diagnosis not present

## 2021-12-30 DIAGNOSIS — X58XXXA Exposure to other specified factors, initial encounter: Secondary | ICD-10-CM | POA: Insufficient documentation

## 2021-12-30 DIAGNOSIS — Q394 Esophageal web: Secondary | ICD-10-CM | POA: Diagnosis not present

## 2021-12-30 HISTORY — PX: ESOPHAGOGASTRODUODENOSCOPY (EGD) WITH PROPOFOL: SHX5813

## 2021-12-30 SURGERY — ESOPHAGOGASTRODUODENOSCOPY (EGD) WITH PROPOFOL
Anesthesia: General

## 2021-12-30 MED ORDER — STERILE WATER FOR INJECTION IJ SOLN
INTRAMUSCULAR | Status: AC
  Filled 2021-12-30: qty 10

## 2021-12-30 MED ORDER — FENTANYL CITRATE (PF) 100 MCG/2ML IJ SOLN
INTRAMUSCULAR | Status: AC
  Filled 2021-12-30: qty 2

## 2021-12-30 MED ORDER — SUCCINYLCHOLINE CHLORIDE 200 MG/10ML IV SOSY
PREFILLED_SYRINGE | INTRAVENOUS | Status: AC
  Filled 2021-12-30: qty 10

## 2021-12-30 MED ORDER — IPRATROPIUM-ALBUTEROL 0.5-2.5 (3) MG/3ML IN SOLN
RESPIRATORY_TRACT | Status: AC
  Filled 2021-12-30: qty 3

## 2021-12-30 MED ORDER — LACTATED RINGERS IV SOLN: INTRAVENOUS | Status: DC

## 2021-12-30 MED ORDER — LIDOCAINE HCL (PF) 2 % IJ SOLN
INTRAMUSCULAR | Status: AC
  Filled 2021-12-30: qty 5

## 2021-12-30 MED ORDER — GLUCAGON HCL RDNA (DIAGNOSTIC) 1 MG IJ SOLR
1.0000 mg | Freq: Once | INTRAMUSCULAR | Status: AC
Start: 2021-12-30 — End: 2021-12-30
  Administered 2021-12-30: 1 mg via INTRAVENOUS
  Filled 2021-12-30: qty 1

## 2021-12-30 MED ORDER — ONDANSETRON HCL 4 MG/2ML IJ SOLN
INTRAMUSCULAR | Status: DC | PRN
  Administered 2021-12-30: 4 mg via INTRAVENOUS

## 2021-12-30 MED ORDER — IPRATROPIUM-ALBUTEROL 0.5-2.5 (3) MG/3ML IN SOLN
3.0000 mL | Freq: Once | RESPIRATORY_TRACT | Status: AC
Start: 2021-12-30 — End: 2021-12-30
  Administered 2021-12-30: 3 mL via RESPIRATORY_TRACT

## 2021-12-30 MED ORDER — FENTANYL CITRATE (PF) 100 MCG/2ML IJ SOLN
INTRAMUSCULAR | Status: DC | PRN
  Administered 2021-12-30: 25 ug via INTRAVENOUS

## 2021-12-30 MED ORDER — SODIUM CHLORIDE 0.9 % IV SOLN: Freq: Once | INTRAVENOUS | Status: AC

## 2021-12-30 MED ORDER — SODIUM CHLORIDE 0.9 % IV SOLN: INTRAVENOUS | Status: DC

## 2021-12-30 MED ORDER — DEXAMETHASONE SODIUM PHOSPHATE 4 MG/ML IJ SOLN
INTRAMUSCULAR | Status: AC
  Filled 2021-12-30: qty 1

## 2021-12-30 MED ORDER — SUCCINYLCHOLINE CHLORIDE 200 MG/10ML IV SOSY
PREFILLED_SYRINGE | INTRAVENOUS | Status: DC | PRN
Start: 2021-12-30 — End: 2021-12-30
  Administered 2021-12-30: 120 mg via INTRAVENOUS

## 2021-12-30 MED ORDER — ONDANSETRON HCL 4 MG/2ML IJ SOLN
INTRAMUSCULAR | Status: AC
  Filled 2021-12-30: qty 2

## 2021-12-30 MED ORDER — PROPOFOL 10 MG/ML IV BOLUS
INTRAVENOUS | Status: DC | PRN
  Administered 2021-12-30: 100 mg via INTRAVENOUS

## 2021-12-30 MED ORDER — DEXAMETHASONE SODIUM PHOSPHATE 10 MG/ML IJ SOLN
INTRAMUSCULAR | Status: DC | PRN
  Administered 2021-12-30: 4 mg via INTRAVENOUS

## 2021-12-30 MED ORDER — LIDOCAINE HCL (CARDIAC) PF 100 MG/5ML IV SOSY
PREFILLED_SYRINGE | INTRAVENOUS | Status: DC | PRN
  Administered 2021-12-30: 50 mg via INTRATRACHEAL

## 2021-12-30 NOTE — Transfer of Care (Signed)
Immediate Anesthesia Transfer of Care Note ? ?Patient: Christina Frey ? ?Procedure(s) Performed: ESOPHAGOGASTRODUODENOSCOPY (EGD) WITH PROPOFOL ? ?Patient Location: PACU ? ?Anesthesia Type:General ? ?Level of Consciousness: awake and alert  ? ?Airway & Oxygen Therapy: Patient Spontanous Breathing and Patient connected to nasal cannula oxygen ? ?Post-op Assessment: Report given to RN and Post -op Vital signs reviewed and stable ? ?Post vital signs: Reviewed and stable ? ?Last Vitals:  ?Vitals Value Taken Time  ?BP 101/83   ?Temp    ?Pulse 77   ?Resp 20   ?SpO2 94%   ? ? ?Last Pain:  ?Vitals:  ? 12/30/21 1333  ?TempSrc:   ?PainSc: 0-No pain  ?   ? ?  ? ?Complications: No notable events documented. ?

## 2021-12-30 NOTE — ED Triage Notes (Signed)
Pt states she took her medications last night and states it feels like a pill is still stuck in her esophagus;  pt states she is unable to keep anything down including water; pt was seen here last week for same complaint; pt went to endo last week and had a foreign body removed ?

## 2021-12-30 NOTE — ED Notes (Signed)
Endo nurse leaving with patient at this time.  ?

## 2021-12-30 NOTE — Op Note (Signed)
[facility] ?Patient Name: Christina Frey ?Procedure Date: 12/30/2021 1:14 PM ?MRN: [id number] ?Date of Birth: 10-15-1940 ?Attending MD: Elon Alas. Abbey Chatters , DO ?CSN: [id number] ?Age: 81 ?Admit Type: Inpatient ?Procedure:                Upper GI endoscopy ?Indications:              Foreign body in the esophagus ?Providers:                Elon Alas. Abbey Chatters, DO, Tammy Vaught, RN, Nelma Rothman,  ?                          Technician ?Referring MD:              ?Medicines:                See the Anesthesia note for documentation of the  ?                          administered medications ?Complications:            No immediate complications. ?Estimated Blood Loss:     Estimated blood loss was minimal. ?Procedure:                Pre-Anesthesia Assessment: ?                          - The anesthesia plan was to use monitored  ?                          anesthesia care (MAC). ?                          After obtaining informed consent, the endoscope was  ?                          passed under direct vision. Throughout the  ?                          procedure, the patient's blood pressure, pulse, and  ?                          oxygen saturations were monitored continuously. The  ?                          GIF-[id number] ([id number]) scope was introduced through the  ?                          mouth, and advanced to the second part of duodenum.  ?                          The upper GI endoscopy was accomplished without  ?                          difficulty. The patient tolerated the procedure  ?                          well. ?Scope In:  1:41:25 PM ?Scope Out: 1:45:33 PM ?Total Procedure Duration: 0 hours 4 minutes 8 seconds  ?Findings: ?     Food was found in the upper third of the esophagus. Removal of food  ?     obstruction was accomplished by pushing food into stomach gently with  ?     endoscope. ?     The entire examined stomach was normal. ?     The duodenal bulb, first portion of the duodenum and second portion of  ?      the duodenum were normal. ?     A web was found in the upper third of the esophagus. ?Impression:               - Food in the upper third of the esophagus. Removal  ?                          was successful. ?                          - Normal stomach. ?                          - Normal duodenal bulb, first portion of the  ?                          duodenum and second portion of the duodenum. ?                          - Web in the upper third of the esophagus. ?Moderate Sedation: ?     Per Anesthesia Care ?Recommendation:           - Patient has a contact number available for  ?                          emergencies. The signs and symptoms of potential  ?                          delayed complications were discussed with the  ?                          patient. Return to normal activities tomorrow.  ?                          Written discharge instructions were provided to the  ?                          patient. ?                          - Mechanical soft diet. ?                          - Use Protonix (pantoprazole) 40 mg PO BID. ?                          - Return to GI clinic as previously scheduled. ?Procedure Code(s):        --- Professional --- ?  [id number], Esophagogastroduodenoscopy, flexible,  ?                          transoral; with removal of foreign body(s) ?Diagnosis Code(s):        --- Professional --- ?                          D89.784R, Food in esophagus causing other injury,  ?                          initial encounter ?                          K22.2, Esophageal obstruction ?                          T18.108A, Unspecified foreign body in esophagus  ?                          causing other injury, initial encounter ?CPT copyright 2019 American Medical Association. All rights reserved. ?The codes documented in this report are preliminary and upon coder review may  ?be revised to meet current compliance requirements. ?Elon Alas. Abbey Chatters, DO ?Elon Alas. Abbey Chatters, DO ?12/30/2021  1:58:58 PM ?This report has been signed electronically. ?Number of Addenda: 0 ?

## 2021-12-30 NOTE — Anesthesia Postprocedure Evaluation (Signed)
Anesthesia Post Note ? ?Patient: Christina Frey ? ?Procedure(s) Performed: ESOPHAGOGASTRODUODENOSCOPY (EGD) WITH PROPOFOL ? ?Patient location during evaluation: Phase II ?Anesthesia Type: General ?Level of consciousness: awake and alert and oriented ?Pain management: pain level controlled ?Vital Signs Assessment: post-procedure vital signs reviewed and stable ?Respiratory status: spontaneous breathing, nonlabored ventilation and respiratory function stable ?Cardiovascular status: blood pressure returned to baseline and stable ?Postop Assessment: no apparent nausea or vomiting ?Anesthetic complications: no ? ? ?No notable events documented. ? ? ?Last Vitals:  ?Vitals:  ? 12/30/21 1515 12/30/21 1521  ?BP:  130/66  ?Pulse:  80  ?Resp:  18  ?Temp:  36.6 ?C  ?SpO2: 91% 92%  ?  ?Last Pain:  ?Vitals:  ? 12/30/21 1521  ?TempSrc: Oral  ?PainSc: 0-No pain  ? ? ?  ?  ?  ?  ?  ?  ? ?[name] C Rolf Fells ? ? ? ? ?

## 2021-12-30 NOTE — Interval H&P Note (Signed)
History and Physical Interval Note: ? ?12/30/2021 ?12:51 PM ? ?Christina Frey  has presented today for surgery, with the diagnosis of food impaction.  The various methods of treatment have been discussed with the patient and family. After consideration of risks, benefits and other options for treatment, the patient has consented to  Procedure(s): ?ESOPHAGOGASTRODUODENOSCOPY (EGD) WITH PROPOFOL (N/A) as a surgical intervention.  The patient's history has been reviewed, patient examined, no change in status, stable for surgery.  I have reviewed the patient's chart and labs.  Questions were answered to the patient's satisfaction.   ? ? ?[name] ? ? ?

## 2021-12-30 NOTE — ED Provider Notes (Signed)
?[facility] ?Provider Note ? ? ?CSN: [id number] ?Arrival date & time: 12/30/21  1045 ? ?  ? ?History ? ?Chief Complaint  ?Patient presents with  ? Foreign Body  ?  In throat  ? ? ?Christina Frey is a 81 y.o. female. ? ?Pt is an 81 yo female with pmhx significant for hyperlipidemia, sleep apnea, GERD, PUD, hypothyroidism, htn, dm2, afib (on Eliquis), arthritis, chf, and pulmonary htn.  Pt was here in the ED on 4/17 for a food impaction.  Dr. Marletta Lor (GI) did an endoscopy which showed a food impaction at the level of the cricopharyngeus.  He was able to remove it and pt was d/c home.  Pt had been eating soft foods until last night when she ate either chicken or pork.  She said it became stuck and she has been unable to swallow anything since then.  She said she tries and it comes back up.  ? ? ?  ? ?Home Medications ?Prior to Admission medications   ?Medication Sig Start Date End Date Taking? Authorizing Provider  ?citalopram (CELEXA) 20 MG tablet Take 10 mg by mouth daily.   Yes [provider]  ?ELIQUIS 5 MG TABS tablet TAKE 1 TABLET BY MOUTH TWICE A DAY 11/06/21  Yes Jonelle Sidle, MD  ?gabapentin (NEURONTIN) 300 MG capsule Take 1 capsule by mouth 3 (three) times daily.  10/22/16  Yes [provider]  ?mirtazapine (REMERON) 15 MG tablet Take 15 mg by mouth at bedtime.   Yes [provider]  ?pantoprazole (PROTONIX) 40 MG tablet Take 1 tablet (40 mg total) by mouth 2 (two) times daily. 12/23/21 06/21/22 Yes [name], DO  ?traMADol (ULTRAM) 50 MG tablet Take 50 mg by mouth as needed. 05/10/21  Yes [provider]  ?carvedilol (COREG) 3.125 MG tablet Take 1 tablet (3.125 mg total) by mouth 2 (two) times daily. 05/20/21 11/22/21  Jonelle Sidle, MD  ?DULoxetine (CYMBALTA) 30 MG capsule Take 30 mg by mouth 2 (two) times daily. 05/03/21   [provider]  ?latanoprost (XALATAN) 0.005 % ophthalmic solution PLACE 1 DROP INTO THE LEFT EYE NIGHTLY.  06/25/21   [provider]  ?levothyroxine (SYNTHROID) 100 MCG tablet Take 100 mcg by mouth daily before breakfast.    [provider]  ?lisinopril (ZESTRIL) 5 MG tablet Take 5 mg by mouth daily.    [provider]  ?oxybutynin (DITROPAN XL) 15 MG 24 hr tablet Take 15 mg by mouth at bedtime.    [provider]  ?potassium chloride SA (KLOR-CON M20) 20 MEQ tablet Take 1 tablet (20 mEq total) by mouth daily. 10/31/21 01/29/22  Strader, Lennart Pall, PA-C  ?torsemide (DEMADEX) 20 MG tablet Take 1 tablet (20 mg total) by mouth 2 (two) times daily. 06/26/21 11/22/21  Ellsworth Lennox, PA-C  ?   ? ?Allergies    ?Other and Zetia [ezetimibe]   ? ?Review of Systems   ?Review of Systems  ?Gastrointestinal:   ?     Food impaction  ?All other systems reviewed and are negative. ? ?Physical Exam ?Updated Vital Signs ?BP 131/69   Pulse 81   Temp 98.2 ?F (36.8 ?C) (Oral)   Resp 20   Ht 5\' 2"  (1.575 m)   Wt 90.3 kg   SpO2 97%   BMI 36.41 kg/m?  ?Physical Exam ?Vitals and nursing note reviewed.  ?Constitutional:   ?   Appearance: Normal appearance.  ?HENT:  ?   Head:  Normocephalic and atraumatic.  ?   Right Ear: External ear normal.  ?   Left Ear: External ear normal.  ?   Nose: Nose normal.  ?   Mouth/Throat:  ?   Mouth: Mucous membranes are moist.  ?   Pharynx: Oropharynx is clear.  ?Eyes:  ?   Extraocular Movements: Extraocular movements intact.  ?   Conjunctiva/sclera: Conjunctivae normal.  ?   Pupils: Pupils are equal, round, and reactive to light.  ?Cardiovascular:  ?   Rate and Rhythm: Normal rate. Rhythm irregular.  ?   Pulses: Normal pulses.  ?   Heart sounds: Normal heart sounds.  ?Pulmonary:  ?   Effort: Pulmonary effort is normal.  ?   Breath sounds: Normal breath sounds.  ?Abdominal:  ?   General: Abdomen is flat. Bowel sounds are normal.  ?   Palpations: Abdomen is soft.  ?Musculoskeletal:     ?   General: Normal range of motion.  ?   Cervical back: Normal range of motion and  neck supple.  ?Skin: ?   General: Skin is warm.  ?   Capillary Refill: Capillary refill takes less than 2 seconds.  ?Neurological:  ?   General: No focal deficit present.  ?   Mental Status: She is alert and oriented to person, place, and time.  ?Psychiatric:     ?   Mood and Affect: Mood normal.     ?   Behavior: Behavior normal.  ? ? ?ED Results / Procedures / Treatments   ?Labs ?(all labs ordered are listed, but only abnormal results are displayed) ?Labs Reviewed - No data to display ? ?EKG ?None ? ?Radiology ?No results found. ? ?Procedures ?Procedures  ? ? ?Medications Ordered in ED ?Medications  ?sterile water (preservative free) injection (  Not Given 12/30/21 1158)  ?0.9 %  sodium chloride infusion (has no administration in time range)  ?lactated ringers infusion ( Intravenous Restarted 12/30/21 1333)  ?0.9 %  sodium chloride infusion ( Intravenous New Bag/Given 12/30/21 1148)  ?glucagon (human recombinant) (GLUCAGEN) injection 1 mg (1 mg Intravenous Given 12/30/21 1149)  ? ? ?ED Course/ Medical Decision Making/ A&P ?  ?                        ?Medical Decision Making ?Risk ?Prescription drug management. ? ? ?This patient presents to the ED for concern of food impaction, this involves an extensive number of treatment options, and is a complaint that carries with it a high risk of complications and morbidity.  The differential diagnosis includes impaction ? ? ?Co morbidities that complicate the patient evaluation ? ? hyperlipidemia, sleep apnea, GERD, PUD, hypothyroidism, htn, dm2, afib (on Eliquis), arthritis, chf, and pulmonary htn ? ? ?Additional history obtained: ? ?Additional history obtained from epic chart review ?External records from outside source obtained and reviewed including son ? ? ? ?Cardiac Monitoring: ? ?The patient was maintained on a cardiac monitor.  I personally viewed and interpreted the cardiac monitored which showed an underlying rhythm of: afib ? ? ?Medicines ordered and prescription  drug management: ? ?I ordered medication including glucagon  for impaction  ?Reevaluation of the patient after these medicines showed that the patient stayed the same ?I have reviewed the patients home medicines and have made adjustments as needed ? ? ?Critical Interventions: ? ?GI consult ? ? ?Consultations Obtained: ? ?I requested consultation with the gastroenterologist (Dr. Marletta Lor),  and discussed lab and imaging findings  as well as pertinent plan - he took pt to the endoscopy suite. ? ?Problem List / ED Course: ? ?Food impaction:  GI will remove ? ? ?Reevaluation: ? ?After the interventions noted above, I reevaluated the patient and found that they have :stayed the same ? ? ?Social Determinants of Health: ? ?Lives at home ? ? ?Dispostion: ? ?After consideration of the diagnostic results and the patients response to treatment, I feel that the patent would benefit from endoscopy.   ? ? ? ? ? ? ? ?Final Clinical Impression(s) / ED Diagnoses ?Final diagnoses:  ?Food impaction of esophagus, initial encounter  ? ? ?Rx / DC Orders ?ED Discharge Orders   ? ? None  ? ?  ? ? ?  ?Jacalyn Lefevre, MD ?12/30/21 1346 ? ?

## 2021-12-30 NOTE — ED Notes (Signed)
Upon assessment patient O2 sats 84% on RA. Patient placed on 2.5 L [place]. O2 92%. ?

## 2021-12-30 NOTE — Anesthesia Preprocedure Evaluation (Addendum)
Anesthesia Evaluation  ?Patient identified by MRN, date of birth, ID band ?Patient awake ? ? ? ?Reviewed: ?Allergy & Precautions, NPO status , Patient's Chart, lab work & pertinent test results ? ?Airway ?Mallampati: II ? ?TM Distance: >3 FB ?Neck ROM: Full ? ? ? Dental ? ?(+) Edentulous Upper, Edentulous Lower ?  ?Pulmonary ?sleep apnea ,  ?  ?Pulmonary exam normal ?breath sounds clear to auscultation ? ? ? ? ? ? Cardiovascular ?Exercise Tolerance: Poor ?hypertension, Pt. on medications ?+CHF  ?Normal cardiovascular exam ?Rhythm:Regular Rate:Normal ? ??1. Left ventricular ejection fraction, by estimation, is 65 to 70%. The left ventricle has normal function. The left ventricle has no regional wall motion abnormalities. Left ventricular diastolic parameters are indeterminate. There is the interventricular septum is flattened in systole and diastole, consistent with right ventricular pressure and volume overload.  ??2. Right ventricular systolic function is mildly reduced. The right ventricular size is moderately enlarged. There is moderately elevated pulmonary artery systolic pressure. The estimated right ventricular  ?systolic pressure is [id number] mmHg.  ??3. Left atrial size was severely dilated.  ??4. Right atrial size was severely dilated.  ??5. A small pericardial effusion is present. The pericardial effusion is posterior to the left ventricle.  ??6. The mitral valve is abnormal. Mild mitral valve regurgitation.  ?Moderate mitral annular calcification.  ??7. Tricuspid valve regurgitation is mild to moderate.  ??8. The aortic valve is tricuspid. Aortic valve regurgitation is not visualized. Mild to moderate aortic valve sclerosis/calcification is present, without any evidence of aortic stenosis. Aortic valve mean  ?gradient measures 3.0 mmHg.  ??9. The inferior vena cava is dilated in size with >50% respiratory variability, suggesting right atrial pressure of 8 mmHg.  ?10. Evidence  of atrial level shunting detected by color flow Doppler. There is a moderately sized secundum atrial septal defect with predominantly left to right shunting across the atrial septum.  ? ?Comparison(s): Prior images unable to be directly viewed.  ?  ?Neuro/Psych ?negative neurological ROS ? negative psych ROS  ? GI/Hepatic ?Neg liver ROS, PUD, GERD  Medicated,  ?Endo/Other  ?diabetes, Well Controlled, Type 2, Oral Hypoglycemic AgentsHypothyroidism  ? Renal/GU ?negative Renal ROS  ?negative genitourinary ?  ?Musculoskeletal ? ?(+) Arthritis , Osteoarthritis,   ? Abdominal ?  ?Peds ?negative pediatric ROS ?(+)  Hematology ?negative hematology ROS ?(+)   ?Anesthesia Other Findings ? ? Reproductive/Obstetrics ?negative OB ROS ? ?  ? ? ? ? ? ? ? ? ? ? ? ? ? ?  ?  ? ? ? ? ? ? ? ?Anesthesia Physical ?Anesthesia Plan ? ?ASA: 3 and emergent ? ?Anesthesia Plan: General  ? ?Post-op Pain Management: Minimal or no pain anticipated  ? ?Induction: Intravenous, Rapid sequence and Cricoid pressure planned ? ?PONV Risk Score and Plan: Ondansetron and Dexamethasone ? ?Airway Management Planned: Oral ETT ? ?Additional Equipment:  ? ?Intra-op Plan:  ? ?Post-operative Plan: Extubation in OR and Possible Post-op intubation/ventilation ? ?Informed Consent: I have reviewed the patients History and Physical, chart, labs and discussed the procedure including the risks, benefits and alternatives for the proposed anesthesia with the patient or authorized representative who has indicated his/her understanding and acceptance.  ? ? ? ? ? ?Plan Discussed with: CRNA and Surgeon ? ?Anesthesia Plan Comments:   ? ? ? ? ? ?Anesthesia Quick Evaluation ? ?

## 2021-12-30 NOTE — Discharge Instructions (Addendum)
EGD ?Discharge instructions ?Please read the instructions outlined below and refer to this sheet in the next few weeks. These discharge instructions provide you with general information on caring for yourself after you leave the hospital. Your doctor may also give you specific instructions. While your treatment has been planned according to the most current medical practices available, unavoidable complications occasionally occur. If you have any problems or questions after discharge, please call your doctor. ?ACTIVITY ?You may resume your regular activity but move at a slower pace for the next 24 hours.  ?Take frequent rest periods for the next 24 hours.  ?Walking will help expel (get rid of) the air and reduce the bloated feeling in your abdomen.  ?No driving for 24 hours (because of the anesthesia (medicine) used during the test).  ?You may shower.  ?Do not sign any important legal documents or operate any machinery for 24 hours (because of the anesthesia used during the test).  ?NUTRITION ?Drink plenty of fluids.  ?You may resume your normal diet.  ?Begin with a light meal and progress to your normal diet.  ?Avoid alcoholic beverages for 24 hours or as instructed by your caregiver.  ?MEDICATIONS ?You may resume your normal medications unless your caregiver tells you otherwise.  ?WHAT YOU CAN EXPECT TODAY ?You may experience abdominal discomfort such as a feeling of fullness or ?gas? pains.  ?FOLLOW-UP ?Your doctor will discuss the results of your test with you.  ?SEEK IMMEDIATE MEDICAL ATTENTION IF ANY OF THE FOLLOWING OCCUR: ?Excessive nausea (feeling sick to your stomach) and/or vomiting.  ?Severe abdominal pain and distention (swelling).  ?Trouble swallowing.  ?Temperature over 101? F (37.8? C).  ?Rectal bleeding or vomiting of blood.  ? ? ?I successfully removed the food impaction.  Continue on pantoprazole twice daily.  Would recommend soft diet.  We cannot safely perform esophageal dilation while you are  taking Eliquis due to risk of bleeding.  This medication will need to be held for 48 hrs. prior to potential dilation in the future which we can arrange.  Follow-up with GI as already scheduled. ? ?I hope you have a great rest of your week! ? ?Elon Alas. Abbey Chatters, D.O. ?Gastroenterology and Hepatology ?[facility] ? ?

## 2021-12-30 NOTE — Anesthesia Procedure Notes (Signed)
Procedure Name: Intubation ?Date/Time: 12/30/2021 1:37 PM ?Performed by: Karna Dupes, CRNA ?Pre-anesthesia Checklist: Patient identified, Emergency Drugs available, Suction available and Patient being monitored ?Patient Re-evaluated:Patient Re-evaluated prior to induction ?Oxygen Delivery Method: Circle system utilized ?Preoxygenation: Pre-oxygenation with 100% oxygen ?Induction Type: IV induction, Rapid sequence and Cricoid Pressure applied ?Laryngoscope Size: Glidescope and 3 ?Grade View: Grade I ?Tube type: Oral ?Tube size: 7.0 mm ?Number of attempts: 1 ?Airway Equipment and Method: Stylet and Oral airway ?Placement Confirmation: ETT inserted through vocal cords under direct vision, positive ETCO2 and breath sounds checked- equal and bilateral ?Secured at: 20 cm ?Tube secured with: Tape ?Dental Injury: Teeth and Oropharynx as per pre-operative assessment  ? ? ? ? ?

## 2021-12-30 NOTE — H&P (Signed)
?Consulting  Provider: Dr. Dayna Barker ?Primary Care Physician:  Redmond School, MD ?Primary Gastroenterologist: None  ? ?Reason for Consultation: esophageal food impaction ? ?HPI:  ?Christina Frey is a 81 y.o. female with a past medical history of CHF, GERD, who presented to [facility], ER today with chief complaint of esophageal food impaction.  Patient states she was eating chicken last night when she felt like it got stuck in her substernal region.  Unable to relieve obstruction with fluids.  Unable to tolerate any liquids without regurgitation.  Unable to tolerate secretions.  Some mild epigastric discomfort.  No shortness of breath.   ? ?Patient with similar presentation 1 week ago requiring urgent endoscopy.  She has a proximal esophageal stricture.  States she was doing well on a soft diet until last night when she ate chicken. ? ?Past Medical History:  ?Diagnosis Date  ? ASD (atrial septal defect)   ? Chronic diastolic CHF (congestive heart failure) ([place])   ? Chronic pain   ? Essential hypertension   ? GERD (gastroesophageal reflux disease)   ? Hyperlipidemia   ? Hypothyroidism   ? Mitral regurgitation   ? Obesity   ? OSA (obstructive sleep apnea)   ? Unable to wear CPAP  ? Osteoarthritis   ? Persistent atrial fibrillation ([place])   ? PUD (peptic ulcer disease)   ? Pulmonary hypertension ([place])   ? Tricuspid regurgitation   ? Type 2 diabetes mellitus ([place])   ? ? ?Past Surgical History:  ?Procedure Laterality Date  ? ESOPHAGOGASTRODUODENOSCOPY (EGD) WITH PROPOFOL N/A 12/23/2021  ? Procedure: ESOPHAGOGASTRODUODENOSCOPY (EGD) WITH PROPOFOL;  Surgeon: Eloise Harman, DO;  Location: [facility];  Service: Endoscopy;  Laterality: N/A;  ? TOTAL KNEE ARTHROPLASTY    ? TUBAL LIGATION    ? ? ?Prior to Admission medications   ?Medication Sig Start Date End Date Taking? Authorizing Provider  ?citalopram (CELEXA) 20 MG tablet Take 10 mg by mouth daily.   Yes [provider]  ?DULoxetine (CYMBALTA) 30 MG capsule  Take 30 mg by mouth 2 (two) times daily. 05/03/21  Yes [provider]  ?ELIQUIS 5 MG TABS tablet TAKE 1 TABLET BY MOUTH TWICE A DAY 11/06/21  Yes Satira Sark, MD  ?gabapentin (NEURONTIN) 300 MG capsule Take 1 capsule by mouth 3 (three) times daily.  10/22/16  Yes [provider]  ?latanoprost (XALATAN) 0.005 % ophthalmic solution Place 1 drop into the left eye at bedtime. 04/23/21  Yes [provider]  ?latanoprost (XALATAN) 0.005 % ophthalmic solution PLACE 1 DROP INTO THE LEFT EYE NIGHTLY. 06/25/21  Yes [provider]  ?levothyroxine (SYNTHROID) 100 MCG tablet Take 100 mcg by mouth daily before breakfast.   Yes [provider]  ?lisinopril (ZESTRIL) 5 MG tablet Take 5 mg by mouth daily.   Yes [provider]  ?mirtazapine (REMERON) 15 MG tablet Take 15 mg by mouth at bedtime.   Yes [provider]  ?oxybutynin (DITROPAN XL) 15 MG 24 hr tablet Take 15 mg by mouth at bedtime.   Yes [provider]  ?pantoprazole (PROTONIX) 40 MG tablet Take 40 mg by mouth daily.   Yes [provider]  ?potassium chloride SA (KLOR-CON M20) 20 MEQ tablet Take 1 tablet (20 mEq total) by mouth daily. 10/31/21 01/29/22 Yes Strader, Fransisco Hertz, PA-C  ?traMADol (ULTRAM) 50 MG tablet Take 50 mg by mouth as needed. 05/10/21  Yes [provider]  ?carvedilol (COREG) 3.125 MG tablet Take 1 tablet (  3.125 mg total) by mouth 2 (two) times daily. 05/20/21 11/22/21  Satira Sark, MD  ?torsemide (DEMADEX) 20 MG tablet Take 1 tablet (20 mg total) by mouth 2 (two) times daily. 06/26/21 11/22/21  Erma Heritage, PA-C  ? ? ?Current Facility-Administered Medications  ?Medication Dose Route Frequency Provider Last Rate Last Admin  ? sterile water (preservative free) injection           ? ?Current Outpatient Medications  ?Medication Sig Dispense Refill  ? carvedilol (COREG) 3.125 MG tablet Take 1 tablet (3.125 mg total) by mouth 2 (two) times daily. 180 tablet  3  ? citalopram (CELEXA) 20 MG tablet Take 10 mg by mouth daily.    ? DULoxetine (CYMBALTA) 30 MG capsule Take 30 mg by mouth 2 (two) times daily.    ? ELIQUIS 5 MG TABS tablet TAKE 1 TABLET BY MOUTH TWICE A DAY 60 tablet 6  ? gabapentin (NEURONTIN) 300 MG capsule Take 1 capsule by mouth 3 (three) times daily.     ? latanoprost (XALATAN) 0.005 % ophthalmic solution PLACE 1 DROP INTO THE LEFT EYE NIGHTLY.    ? levothyroxine (SYNTHROID) 100 MCG tablet Take 100 mcg by mouth daily before breakfast.    ? lisinopril (ZESTRIL) 5 MG tablet Take 5 mg by mouth daily.    ? mirtazapine (REMERON) 15 MG tablet Take 15 mg by mouth at bedtime.    ? oxybutynin (DITROPAN XL) 15 MG 24 hr tablet Take 15 mg by mouth at bedtime.    ? pantoprazole (PROTONIX) 40 MG tablet Take 1 tablet (40 mg total) by mouth 2 (two) times daily. 60 tablet 5  ? potassium chloride SA (KLOR-CON M20) 20 MEQ tablet Take 1 tablet (20 mEq total) by mouth daily. 90 tablet 3  ? torsemide (DEMADEX) 20 MG tablet Take 1 tablet (20 mg total) by mouth 2 (two) times daily. 180 tablet 3  ? traMADol (ULTRAM) 50 MG tablet Take 50 mg by mouth as needed.    ? ? ?Allergies as of 12/30/2021 - Review Complete 12/30/2021  ?Allergen Reaction Noted  ? Other  10/03/2015  ? Zetia [ezetimibe] Other (See Comments) 10/03/2015  ? ? ?Family History  ?Problem Relation Age of Onset  ? ALS Brother   ? Heart disease Brother   ? Hypertension Brother   ? Arthritis Brother   ? Diabetes Sister   ? ? ?Social History  ? ?Socioeconomic History  ? Marital status: Widowed  ?  Spouse name: Not on file  ? Number of children: Not on file  ? Years of education: Not on file  ? Highest education level: Not on file  ?Occupational History  ? Not on file  ?Tobacco Use  ? Smoking status: Never  ? Smokeless tobacco: Never  ?Vaping Use  ? Vaping Use: Never used  ?Substance and Sexual Activity  ? Alcohol use: No  ? Drug use: No  ? Sexual activity: Not on file  ?Other Topics Concern  ? Not on file  ?Social History  Narrative  ? Not on file  ? ?Social Determinants of Health  ? ?Financial Resource Strain: Not on file  ?Food Insecurity: Not on file  ?Transportation Needs: Not on file  ?Physical Activity: Not on file  ?Stress: Not on file  ?Social Connections: Not on file  ?Intimate Partner Violence: Not on file  ? ? ?Review of Systems: ?General: Negative for anorexia, weight loss, fever, chills, fatigue, weakness. ?Eyes: Negative for vision changes.  ?ENT: Negative for  hoarseness, difficulty swallowing , nasal congestion. ?CV: Negative for chest pain, angina, palpitations, dyspnea on exertion, peripheral edema.  ?Respiratory: Negative for dyspnea at rest, dyspnea on exertion, cough, sputum, wheezing.  ?GI: See history of present illness. ?GU:  Negative for dysuria, hematuria, urinary incontinence, urinary frequency, nocturnal urination.  ?MS: Negative for joint pain, low back pain.  ?Derm: Negative for rash or itching.  ?Neuro: Negative for weakness, abnormal sensation, seizure, frequent headaches, memory loss, confusion.  ?Psych: Negative for anxiety, depression ?Endo: Negative for unusual weight change.  ?Heme: Negative for bruising or bleeding. ?Allergy: Negative for rash or hives. ? ?Physical Exam: ?Vital signs in last 24 hours: ?Temp:  [98.2 ?F (36.8 ?C)] 98.2 ?F (36.8 ?C) (04/24 1102) ?Pulse Rate:  [67-79] 67 (04/24 1230) ?Resp:  [17-21] 17 (04/24 1230) ?BP: (119-147)/(69-86) 119/72 (04/24 1230) ?SpO2:  [90 %-94 %] 90 % (04/24 1230) ?Weight:  [90.3 kg] 90.3 kg (04/24 1104) ?  ?General:   Alert,  Well-developed, well-nourished, pleasant and cooperative in NAD ?Head:  Normocephalic and atraumatic. ?Eyes:  Sclera clear, no icterus.   Conjunctiva pink. ?Ears:  Normal auditory acuity. ?Nose:  No deformity, discharge,  or lesions. ?Mouth:  No deformity or lesions, dentition normal. ?Neck:  Supple; no masses or thyromegaly. ?Lungs:  Clear throughout to auscultation.   No wheezes, crackles, or rhonchi. No acute distress. ?Heart:   Regular rate and rhythm; no murmurs, clicks, rubs,  or gallops. ?Abdomen:  Soft, nontender and nondistended. No masses, hepatosplenomegaly or hernias noted. Normal bowel sounds, without guarding, and

## 2022-01-04 DIAGNOSIS — Z20822 Contact with and (suspected) exposure to covid-19: Secondary | ICD-10-CM | POA: Diagnosis not present

## 2022-01-07 DIAGNOSIS — Z20822 Contact with and (suspected) exposure to covid-19: Secondary | ICD-10-CM | POA: Diagnosis not present

## 2022-01-11 DIAGNOSIS — Z20822 Contact with and (suspected) exposure to covid-19: Secondary | ICD-10-CM | POA: Diagnosis not present

## 2022-01-13 DIAGNOSIS — Z20822 Contact with and (suspected) exposure to covid-19: Secondary | ICD-10-CM | POA: Diagnosis not present

## 2022-01-14 DIAGNOSIS — Z20828 Contact with and (suspected) exposure to other viral communicable diseases: Secondary | ICD-10-CM | POA: Diagnosis not present

## 2022-01-22 ENCOUNTER — Encounter: Payer: Medicare Other | Admitting: Pulmonary Disease

## 2022-02-04 ENCOUNTER — Ambulatory Visit: Payer: Medicare Other | Attending: Pulmonary Disease | Admitting: Pulmonary Disease

## 2022-02-04 DIAGNOSIS — G4733 Obstructive sleep apnea (adult) (pediatric): Secondary | ICD-10-CM | POA: Diagnosis not present

## 2022-02-07 DIAGNOSIS — G4733 Obstructive sleep apnea (adult) (pediatric): Secondary | ICD-10-CM

## 2022-02-07 NOTE — Procedures (Signed)
Patient Name: Christina Frey, Christina Frey Date: 02/04/2022 Gender: Female D.O.B: July 27, 1941 Age (years): 6 Referring Provider: Kara Mead MD, ABSM Height (inches): 62 Interpreting Physician: Kara Mead MD, ABSM Weight (lbs): 200 RPSGT: Rosebud Poles BMI: 37 MRN: [id number] Neck Size: 15.50 <br> <br> CLINICAL INFORMATION The patient is referred for a CPAP titration to treat sleep apnea.    Date of  HST: 12/2021  mild  OSA with AHI 8/ hr with lowest desaturation 55% & nocturnal hypoxia  SLEEP STUDY TECHNIQUE As per the AASM Manual for the Scoring of Sleep and Associated Events v2.3 (April 2016) with a hypopnea requiring 4% desaturations.  The channels recorded and monitored were frontal, central and occipital EEG, electrooculogram (EOG), submentalis EMG (chin), nasal and oral airflow, thoracic and abdominal wall motion, anterior tibialis EMG, snore microphone, electrocardiogram, and pulse oximetry. Continuous positive airway pressure (CPAP) was initiated at the beginning of the study and titrated to treat sleep-disordered breathing.  MEDICATIONS Medications self-administered by patient taken the night of the study : N/A  TECHNICIAN COMMENTS Comments added by technician: CPAP therapy started at 4 CWP. Patient tolerated CPAP fairly well. Patient was restless all through the night. Patient had difficulty maintaining sleep. Coughing episodes noted frequently. Titration increased to 7 CWP, with an EPR of 3. EKG = Bradycardia at times. PLMS noticed through out the study. Sleep talking noticed at times. Suboptimal pressure obtainded to due patients' inability to maintain sleep Comments added by scorer: N/A   RESPIRATORY PARAMETERS Optimal PAP Pressure (cm):  AHI at Optimal Pressure (/hr): N/A Overall Minimal O2 (%): 81.00 Supine % at Optimal Pressure (%): N/A Minimal O2 at Optimal Pressure (%): 81.00   SLEEP ARCHITECTURE The study was initiated at 10:55:28 PM and ended at 5:24:55  AM.  Sleep onset time was 36.8 minutes and the sleep efficiency was 26.7%. The total sleep time was 104 minutes.  The patient spent 6.73% of the night in stage N1 sleep, 76.44% in stage N2 sleep, 16.83% in stage N3 and 0% in REM.Stage REM latency was N/A minutes  Wake after sleep onset was 248.7. Alpha intrusion was absent. Supine sleep was 64.84%.  CARDIAC DATA The 2 lead EKG demonstrated atrial fibrillation. The mean heart rate was 55.81 beats per minute. Other EKG findings include: PVCs.  LEG MOVEMENT DATA The total Periodic Limb Movements of Sleep (PLMS) were 175. The PLMS index was 100.96. A PLMS index of <15 is considered normal in adults.  IMPRESSIONS - An optimal PAP pressure could not be selected for this patient based on the available study data. Only 104 minutes of sleep was noted. Events & desaturations persisted on final CPAP 7 cm - Moderate oxygen desaturations were observed during this titration (min O2 = 81.00%). - The patient snored with moderate snoring volume during this titration study. - 2-lead EKG demonstrated: PVCs - Severe periodic limb movements were observed during this study. Arousals associated with PLMs were significant.   DIAGNOSIS - Obstructive Sleep Apnea (G47.33) - Periodic Limb movments   RECOMMENDATIONS - Recommend a trial of Auto-CPAP 5-12 cm H2O. Reassess need for oxygen on CPAP - Avoid alcohol, sedatives and other CNS depressants that may worsen sleep apnea and disrupt normal sleep architecture. - Sleep hygiene should be reviewed to assess factors that may improve sleep quality. - Weight management and regular exercise should be initiated or continued. - Return to [facility] Center for re-evaluation after 4 weeks of therapy   Kara Mead MD Board Certified in [place]

## 2022-02-13 ENCOUNTER — Telehealth: Payer: Self-pay | Admitting: Pulmonary Disease

## 2022-02-13 DIAGNOSIS — G4733 Obstructive sleep apnea (adult) (pediatric): Secondary | ICD-10-CM

## 2022-02-14 NOTE — Telephone Encounter (Signed)
Called and spoke to Christina Frey (ok per dpr) and gave her results and recommendations. She voiced understanding. Order placed. She asks that we try to send the order to [facility] first. Will place that in order note. Nothing further needed

## 2022-02-27 ENCOUNTER — Ambulatory Visit (INDEPENDENT_AMBULATORY_CARE_PROVIDER_SITE_OTHER): Payer: Medicare Other | Admitting: Gastroenterology

## 2022-02-27 ENCOUNTER — Telehealth: Payer: Self-pay | Admitting: *Deleted

## 2022-02-27 ENCOUNTER — Encounter: Payer: Self-pay | Admitting: *Deleted

## 2022-02-27 ENCOUNTER — Encounter: Payer: Self-pay | Admitting: Gastroenterology

## 2022-02-27 VITALS — BP 120/82 | HR 67 | Temp 97.3°F | Ht 62.0 in | Wt 192.8 lb

## 2022-02-27 DIAGNOSIS — T18128D Food in esophagus causing other injury, subsequent encounter: Secondary | ICD-10-CM | POA: Diagnosis not present

## 2022-02-27 NOTE — Progress Notes (Signed)
Gastroenterology Office Note     Primary Care Physician:  Elfredia Nevins, MD  Primary Gastroenterologist: Dr. Marletta Lor   Chief Complaint   Chief Complaint  Patient presents with   Follow-up    3 month follow up     History of Present Illness   Christina Frey is an 81 y.o. female presenting today in follow-up with a history of food impaction X 2 in April 2023 requiring disimpaction via EGD.  She returns today to discuss elective EGD/dilation. She has a known esophageal web. She notes solid food dysphagia so has been only eating soft foods. No GERD. No overt GI bleeding. No abdominal pain. She is on Eliquis for afib.  Son, Tawanna Cooler, is present with her today.        Past Medical History:  Diagnosis Date   ASD (atrial septal defect)    Chronic diastolic CHF (congestive heart failure) (HCC)    Chronic pain    Essential hypertension    GERD (gastroesophageal reflux disease)    Hyperlipidemia    Hypothyroidism    Mitral regurgitation    Obesity    OSA (obstructive sleep apnea)    Unable to wear CPAP   Osteoarthritis    Persistent atrial fibrillation (HCC)    PUD (peptic ulcer disease)    Pulmonary hypertension (HCC)    Tricuspid regurgitation    Type 2 diabetes mellitus (HCC)     Past Surgical History:  Procedure Laterality Date   ESOPHAGOGASTRODUODENOSCOPY (EGD) WITH PROPOFOL N/A 12/23/2021   Procedure: ESOPHAGOGASTRODUODENOSCOPY (EGD) WITH PROPOFOL;  Surgeon: [name], DO;  Location: [facility];  Service: Endoscopy;  Laterality: N/A;   ESOPHAGOGASTRODUODENOSCOPY (EGD) WITH PROPOFOL N/A 12/30/2021   Procedure: ESOPHAGOGASTRODUODENOSCOPY (EGD) WITH PROPOFOL;  Surgeon: [name], DO;  Location: [facility];  Service: Endoscopy;  Laterality: N/A;   TOTAL KNEE ARTHROPLASTY     TUBAL LIGATION      Current Outpatient Medications  Medication Sig Dispense Refill   citalopram (CELEXA) 20 MG tablet Take 10 mg by mouth daily.     ELIQUIS 5 MG  TABS tablet TAKE 1 TABLET BY MOUTH TWICE A DAY 60 tablet 6   gabapentin (NEURONTIN) 300 MG capsule Take 1 capsule by mouth 3 (three) times daily.      latanoprost (XALATAN) 0.005 % ophthalmic solution PLACE 1 DROP INTO THE LEFT EYE NIGHTLY.     levothyroxine (SYNTHROID) 100 MCG tablet Take 100 mcg by mouth daily before breakfast.     lisinopril (ZESTRIL) 5 MG tablet Take 5 mg by mouth daily.     mirtazapine (REMERON) 15 MG tablet Take 15 mg by mouth at bedtime.     oxybutynin (DITROPAN XL) 15 MG 24 hr tablet Take 15 mg by mouth at bedtime.     pantoprazole (PROTONIX) 40 MG tablet Take 1 tablet (40 mg total) by mouth 2 (two) times daily. 60 tablet 5   traMADol (ULTRAM) 50 MG tablet Take 50 mg by mouth as needed.     carvedilol (COREG) 3.125 MG tablet Take 1 tablet (3.125 mg total) by mouth 2 (two) times daily. 180 tablet 3   DULoxetine (CYMBALTA) 30 MG capsule Take 30 mg by mouth 2 (two) times daily. (Patient not taking: Reported on 02/27/2022)     potassium chloride SA (KLOR-CON M20) 20 MEQ tablet Take 1 tablet (20 mEq total) by mouth daily. 90 tablet 3   torsemide (DEMADEX) 20 MG tablet Take 1 tablet (20 mg  total) by mouth 2 (two) times daily. 180 tablet 3   No current facility-administered medications for this visit.    Allergies as of 02/27/2022 - Review Complete 02/27/2022  Allergen Reaction Noted   Other  10/03/2015   Zetia [ezetimibe] Other (See Comments) 10/03/2015    Family History  Problem Relation Age of Onset   ALS Brother    Heart disease Brother    Hypertension Brother    Arthritis Brother    Diabetes Sister     Social History   Socioeconomic History   Marital status: Widowed    Spouse name: Not on file   Number of children: Not on file   Years of education: Not on file   Highest education level: Not on file  Occupational History   Not on file  Tobacco Use   Smoking status: Never   Smokeless tobacco: Never  Vaping Use   Vaping Use: Never used  Substance and  Sexual Activity   Alcohol use: No   Drug use: No   Sexual activity: Not on file  Other Topics Concern   Not on file  Social History Narrative   Not on file   Social Determinants of Health   Financial Resource Strain: Not on file  Food Insecurity: Not on file  Transportation Needs: Not on file  Physical Activity: Not on file  Stress: Not on file  Social Connections: Not on file  Intimate Partner Violence: Not on file     Review of Systems   Gen: Denies any fever, chills, fatigue, weight loss, lack of appetite.  CV: Denies chest pain, heart palpitations, peripheral edema, syncope.  Resp: Denies shortness of breath at rest or with exertion. Denies wheezing or cough.  GI: see HPI GU : Denies urinary burning, urinary frequency, urinary hesitancy MS: Denies joint pain, muscle weakness, cramps, or limitation of movement.  Derm: Denies rash, itching, dry skin Psych: Denies depression, anxiety, memory loss, and confusion Heme: Denies bruising, bleeding, and enlarged lymph nodes.   Physical Exam   BP 120/82   Pulse 67   Temp (!) 97.3 F (36.3 C)   Ht 5\' 2"  (1.575 m)   Wt 192 lb 12.8 oz (87.5 kg)   BMI 35.26 kg/m  General:   Alert and oriented. Pleasant and cooperative. Well-nourished and well-developed.  Head:  Normocephalic and atraumatic. Eyes:  Without icterus Abdomen:  +BS, soft, non-tender and non-distended. No HSM noted. No guarding or rebound. No masses appreciated.  Rectal:  Deferred  Msk:  Symmetrical without gross deformities. Normal posture. Extremities:  Without edema. Neurologic:  Alert and  oriented x4;  grossly normal neurologically. Skin:  Intact without significant lesions or rashes. Psych:  Alert and cooperative. Normal mood and affect.   Assessment   Christina Frey is an 81 y.o. female presenting today in follow-up with a history of esophageal web on EGD and requiring 2 emergent EGDs for food disimpaction in April 2023.  She presents today to  discuss elective dilation. She is adhering to soft foods at this time. Still with intermittent dysphagia.    PLAN    Hold Eliquis 48 hours prior Proceed with upper endoscopy/dilation by Dr. May 2023 in near future: the risks, benefits, and alternatives have been discussed with the patient in detail. The patient states understanding and desires to proceed.  Continue PPI BID Continue soft diet    Marletta Lor, PhD, [facility] Gastroenterology

## 2022-02-27 NOTE — Patient Instructions (Signed)
I am glad you are doing better!  We are arranging an endoscopy with dilation by Dr. Marletta Lor.  Please stop Eliquis 2 days prior!  It was a pleasure to see you today. I want to create trusting relationships with patients to provide genuine, compassionate, and quality care. I value your feedback. If you receive a survey regarding your visit,  I greatly appreciate you taking time to fill this out.   Gelene Mink, PhD, ANP-BC [facility] Gastroenter[facility]

## 2022-02-27 NOTE — H&P (View-Only) (Signed)
     Gastroenterology Office Note     Primary Care Physician:  Fusco, Lawrence, MD  Primary Gastroenterologist: Dr. Carver   Chief Complaint   Chief Complaint  Patient presents with   Follow-up    3 month follow up     History of Present Illness   Quaneisha T Rommel is an 81 y.o. female presenting today in follow-up with a history of food impaction X 2 in April 2023 requiring disimpaction via EGD.  She returns today to discuss elective EGD/dilation. She has a known esophageal web. She notes solid food dysphagia so has been only eating soft foods. No GERD. No overt GI bleeding. No abdominal pain. She is on Eliquis for afib.  Son, Todd, is present with her today.        Past Medical History:  Diagnosis Date   ASD (atrial septal defect)    Chronic diastolic CHF (congestive heart failure) (HCC)    Chronic pain    Essential hypertension    GERD (gastroesophageal reflux disease)    Hyperlipidemia    Hypothyroidism    Mitral regurgitation    Obesity    OSA (obstructive sleep apnea)    Unable to wear CPAP   Osteoarthritis    Persistent atrial fibrillation (HCC)    PUD (peptic ulcer disease)    Pulmonary hypertension (HCC)    Tricuspid regurgitation    Type 2 diabetes mellitus (HCC)     Past Surgical History:  Procedure Laterality Date   ESOPHAGOGASTRODUODENOSCOPY (EGD) WITH PROPOFOL N/A 12/23/2021   Procedure: ESOPHAGOGASTRODUODENOSCOPY (EGD) WITH PROPOFOL;  Surgeon: Carver, Charles K, DO;  Location: [facility];  Service: Endoscopy;  Laterality: N/A;   ESOPHAGOGASTRODUODENOSCOPY (EGD) WITH PROPOFOL N/A 12/30/2021   Procedure: ESOPHAGOGASTRODUODENOSCOPY (EGD) WITH PROPOFOL;  Surgeon: Carver, Charles K, DO;  Location: [facility];  Service: Endoscopy;  Laterality: N/A;   TOTAL KNEE ARTHROPLASTY     TUBAL LIGATION      Current Outpatient Medications  Medication Sig Dispense Refill   citalopram (CELEXA) 20 MG tablet Take 10 mg by mouth daily.     ELIQUIS 5 MG  TABS tablet TAKE 1 TABLET BY MOUTH TWICE A DAY 60 tablet 6   gabapentin (NEURONTIN) 300 MG capsule Take 1 capsule by mouth 3 (three) times daily.      latanoprost (XALATAN) 0.005 % ophthalmic solution PLACE 1 DROP INTO THE LEFT EYE NIGHTLY.     levothyroxine (SYNTHROID) 100 MCG tablet Take 100 mcg by mouth daily before breakfast.     lisinopril (ZESTRIL) 5 MG tablet Take 5 mg by mouth daily.     mirtazapine (REMERON) 15 MG tablet Take 15 mg by mouth at bedtime.     oxybutynin (DITROPAN XL) 15 MG 24 hr tablet Take 15 mg by mouth at bedtime.     pantoprazole (PROTONIX) 40 MG tablet Take 1 tablet (40 mg total) by mouth 2 (two) times daily. 60 tablet 5   traMADol (ULTRAM) 50 MG tablet Take 50 mg by mouth as needed.     carvedilol (COREG) 3.125 MG tablet Take 1 tablet (3.125 mg total) by mouth 2 (two) times daily. 180 tablet 3   DULoxetine (CYMBALTA) 30 MG capsule Take 30 mg by mouth 2 (two) times daily. (Patient not taking: Reported on 02/27/2022)     potassium chloride SA (KLOR-CON M20) 20 MEQ tablet Take 1 tablet (20 mEq total) by mouth daily. 90 tablet 3   torsemide (DEMADEX) 20 MG tablet Take 1 tablet (20 mg   total) by mouth 2 (two) times daily. 180 tablet 3   No current facility-administered medications for this visit.    Allergies as of 02/27/2022 - Review Complete 02/27/2022  Allergen Reaction Noted   Other  10/03/2015   Zetia [ezetimibe] Other (See Comments) 10/03/2015    Family History  Problem Relation Age of Onset   ALS Brother    Heart disease Brother    Hypertension Brother    Arthritis Brother    Diabetes Sister     Social History   Socioeconomic History   Marital status: Widowed    Spouse name: Not on file   Number of children: Not on file   Years of education: Not on file   Highest education level: Not on file  Occupational History   Not on file  Tobacco Use   Smoking status: Never   Smokeless tobacco: Never  Vaping Use   Vaping Use: Never used  Substance and  Sexual Activity   Alcohol use: No   Drug use: No   Sexual activity: Not on file  Other Topics Concern   Not on file  Social History Narrative   Not on file   Social Determinants of Health   Financial Resource Strain: Not on file  Food Insecurity: Not on file  Transportation Needs: Not on file  Physical Activity: Not on file  Stress: Not on file  Social Connections: Not on file  Intimate Partner Violence: Not on file     Review of Systems   Gen: Denies any fever, chills, fatigue, weight loss, lack of appetite.  CV: Denies chest pain, heart palpitations, peripheral edema, syncope.  Resp: Denies shortness of breath at rest or with exertion. Denies wheezing or cough.  GI: see HPI GU : Denies urinary burning, urinary frequency, urinary hesitancy MS: Denies joint pain, muscle weakness, cramps, or limitation of movement.  Derm: Denies rash, itching, dry skin Psych: Denies depression, anxiety, memory loss, and confusion Heme: Denies bruising, bleeding, and enlarged lymph nodes.   Physical Exam   BP 120/82   Pulse 67   Temp (!) 97.3 F (36.3 C)   Ht 5\' 2"  (1.575 m)   Wt 192 lb 12.8 oz (87.5 kg)   BMI 35.26 kg/m  General:   Alert and oriented. Pleasant and cooperative. Well-nourished and well-developed.  Head:  Normocephalic and atraumatic. Eyes:  Without icterus Abdomen:  +BS, soft, non-tender and non-distended. No HSM noted. No guarding or rebound. No masses appreciated.  Rectal:  Deferred  Msk:  Symmetrical without gross deformities. Normal posture. Extremities:  Without edema. Neurologic:  Alert and  oriented x4;  grossly normal neurologically. Skin:  Intact without significant lesions or rashes. Psych:  Alert and cooperative. Normal mood and affect.   Assessment   JOLEY UTECHT is an 81 y.o. female presenting today in follow-up with a history of esophageal web on EGD and requiring 2 emergent EGDs for food disimpaction in April 2023.  She presents today to  discuss elective dilation. She is adhering to soft foods at this time. Still with intermittent dysphagia.    PLAN    Hold Eliquis 48 hours prior Proceed with upper endoscopy/dilation by Dr. May 2023 in near future: the risks, benefits, and alternatives have been discussed with the patient in detail. The patient states understanding and desires to proceed.  Continue PPI BID Continue soft diet    Marletta Lor, PhD, [facility] Gastroenterology

## 2022-02-27 NOTE — Telephone Encounter (Signed)
LMOVM to call back to schedule EGD/DIL with Dr. Carver, ASA 3 °

## 2022-03-13 NOTE — Patient Instructions (Signed)
Christina Frey  03/13/2022     @PREFPERIOPPHARMACY @   Your procedure is scheduled on  03/20/2022.   Report to 03/22/2022 at  1130  A.M.   Call this number if you have problems the morning of surgery:  [phone]   Remember:  Follow the diet instructions given to you by the office.    Your last dose of eliquis should be on 03/17/2022.     Take these medicines the morning of surgery with A SIP OF WATER            coreg, neurontin, synthroid, prilosec, ultram (if needed).     Do not wear jewelry, make-up or nail polish.  Do not wear lotions, powders, or perfumes, or deodorant.  Do not shave 48 hours prior to surgery.  Men may shave face and neck.  Do not bring valuables to the hospital.  [facility] is not responsible for any belongings or valuables.  Contacts, dentures or bridgework may not be worn into surgery.  Leave your suitcase in the car.  After surgery it may be brought to your room.  For patients admitted to the hospital, discharge time will be determined by your treatment team.  Patients discharged the day of surgery will not be allowed to drive home and must have someone with them for 24 hours.    Special instructions:   DO NOT smoke tobacco or vape for 24  hours before your procedure.  Please read over the following fact sheets that you were given. Anesthesia Post-op Instructions and Care and Recovery After Surgery      Upper Endoscopy, Adult, Care After This sheet gives you information about how to care for yourself after your procedure. Your health care provider may also give you more specific instructions. If you have problems or questions, contact your health care provider. What can I expect after the procedure? After the procedure, it is common to have: A sore throat. Mild stomach pain or discomfort. Bloating. Nausea. Follow these instructions at home:  Follow instructions from your health care provider about what to eat or drink  after your procedure. Return to your normal activities as told by your health care provider. Ask your health care provider what activities are safe for you. Take over-the-counter and prescription medicines only as told by your health care provider. If you were given a sedative during the procedure, it can affect you for several hours. Do not drive or operate machinery until your health care provider says that it is safe. Keep all follow-up visits as told by your health care provider. This is important. Contact a health care provider if you have: A sore throat that lasts longer than one day. Trouble swallowing. Get help right away if: You vomit blood or your vomit looks like coffee grounds. You have: A fever. Bloody, black, or tarry stools. A severe sore throat or you cannot swallow. Difficulty breathing. Severe pain in your chest or abdomen. Summary After the procedure, it is common to have a sore throat, mild stomach discomfort, bloating, and nausea. If you were given a sedative during the procedure, it can affect you for several hours. Do not drive or operate machinery until your health care provider says that it is safe. Follow instructions from your health care provider about what to eat or drink after your procedure. Return to your normal activities as told by your health care provider. This information is not intended to replace advice  given to you by your health care provider. Make sure you discuss any questions you have with your health care provider. Document Revised: 07/01/2019 Document Reviewed: 01/25/2018 Elsevier Patient Education  2023 Elsevier Inc. Esophageal Dilatation Esophageal dilatation, also called esophageal dilation, is a procedure to widen or open a blocked or narrowed part of the esophagus. The esophagus is the part of the body that moves food and liquid from the mouth to the stomach. You may need this procedure if: You have a buildup of scar tissue in your  esophagus that makes it difficult, painful, or impossible to swallow. This can be caused by gastroesophageal reflux disease (GERD). You have cancer of the esophagus. There is a problem with how food moves through your esophagus. In some cases, you may need this procedure repeated at a later time to dilate the esophagus gradually. Tell a health care provider about: Any allergies you have. All medicines you are taking, including vitamins, herbs, eye drops, creams, and over-the-counter medicines. Any problems you or family members have had with anesthetic medicines. Any blood disorders you have. Any surgeries you have had. Any medical conditions you have. Any antibiotic medicines you are required to take before dental procedures. Whether you are pregnant or may be pregnant. What are the risks? Generally, this is a safe procedure. However, problems may occur, including: Bleeding due to a tear in the lining of the esophagus. A hole, or perforation, in the esophagus. What happens before the procedure? Ask your health care provider about: Changing or stopping your regular medicines. This is especially important if you are taking diabetes medicines or blood thinners. Taking medicines such as aspirin and ibuprofen. These medicines can thin your blood. Do not take these medicines unless your health care provider tells you to take them. Taking over-the-counter medicines, vitamins, herbs, and supplements. Follow instructions from your health care provider about eating or drinking restrictions. Plan to have a responsible adult take you home from the hospital or clinic. Plan to have a responsible adult care for you for the time you are told after you leave the hospital or clinic. This is important. What happens during the procedure? You may be given a medicine to help you relax (sedative). A numbing medicine may be sprayed into the back of your throat, or you may gargle the medicine. Your health care  provider may perform the dilatation using various surgical instruments, such as: Simple dilators. This instrument is carefully placed in the esophagus to stretch it. Guided wire bougies. This involves using an endoscope to insert a wire into the esophagus. A dilator is passed over this wire to enlarge the esophagus. Then the wire is removed. Balloon dilators. An endoscope with a small balloon is inserted into the esophagus. The balloon is inflated to stretch the esophagus and open it up. The procedure may vary among health care providers and hospitals. What can I expect after the procedure? Your blood pressure, heart rate, breathing rate, and blood oxygen level will be monitored until you leave the hospital or clinic. Your throat may feel slightly sore and numb. This will get better over time. You will not be allowed to eat or drink until your throat is no longer numb. When you are able to drink, urinate, and sit on the edge of the bed without nausea or dizziness, you may be able to return home. Follow these instructions at home: Take over-the-counter and prescription medicines only as told by your health care provider. If you were given a sedative  during the procedure, it can affect you for several hours. Do not drive or operate machinery until your health care provider says that it is safe. Plan to have a responsible adult care for you for the time you are told. This is important. Follow instructions from your health care provider about any eating or drinking restrictions. Do not use any products that contain nicotine or tobacco, such as cigarettes, e-cigarettes, and chewing tobacco. If you need help quitting, ask your health care provider. Keep all follow-up visits. This is important. Contact a health care provider if: You have a fever. You have pain that is not relieved by medicine. Get help right away if: You have chest pain. You have trouble breathing. You have trouble swallowing. You  vomit blood. You have black, tarry, or bloody stools. These symptoms may represent a serious problem that is an emergency. Do not wait to see if the symptoms will go away. Get medical help right away. Call your local emergency services (911 in the [place] Do not drive yourself to the hospital. Summary Esophageal dilatation, also called esophageal dilation, is a procedure to widen or open a blocked or narrowed part of the esophagus. Plan to have a responsible adult take you home from the hospital or clinic. For this procedure, a numbing medicine may be sprayed into the back of your throat, or you may gargle the medicine. Do not drive or operate machinery until your health care provider says that it is safe. This information is not intended to replace advice given to you by your health care provider. Make sure you discuss any questions you have with your health care provider. Document Revised: 01/11/2020 Document Reviewed: 01/11/2020 Elsevier Patient Education  2023 Elsevier Inc. Monitored Anesthesia Care, Care After This sheet gives you information about how to care for yourself after your procedure. Your health care provider may also give you more specific instructions. If you have problems or questions, contact your health care provider. What can I expect after the procedure? After the procedure, it is common to have: Tiredness. Forgetfulness about what happened after the procedure. Impaired judgment for important decisions. Nausea or vomiting. Some difficulty with balance. Follow these instructions at home: For the time period you were told by your health care provider:     Rest as needed. Do not participate in activities where you could fall or become injured. Do not drive or use machinery. Do not drink alcohol. Do not take sleeping pills or medicines that cause drowsiness. Do not make important decisions or sign legal documents. Do not take care of children on your own. Eating and  drinking Follow the diet that is recommended by your health care provider. Drink enough fluid to keep your urine pale yellow. If you vomit: Drink water, juice, or soup when you can drink without vomiting. Make sure you have little or no nausea before eating solid foods. General instructions Have a responsible adult stay with you for the time you are told. It is important to have someone help care for you until you are awake and alert. Take over-the-counter and prescription medicines only as told by your health care provider. If you have sleep apnea, surgery and certain medicines can increase your risk for breathing problems. Follow instructions from your health care provider about wearing your sleep device: Anytime you are sleeping, including during daytime naps. While taking prescription pain medicines, sleeping medicines, or medicines that make you drowsy. Avoid smoking. Keep all follow-up visits as told by your health  care provider. This is important. Contact a health care provider if: You keep feeling nauseous or you keep vomiting. You feel light-headed. You are still sleepy or having trouble with balance after 24 hours. You develop a rash. You have a fever. You have redness or swelling around the IV site. Get help right away if: You have trouble breathing. You have new-onset confusion at home. Summary For several hours after your procedure, you may feel tired. You may also be forgetful and have poor judgment. Have a responsible adult stay with you for the time you are told. It is important to have someone help care for you until you are awake and alert. Rest as told. Do not drive or operate machinery. Do not drink alcohol or take sleeping pills. Get help right away if you have trouble breathing, or if you suddenly become confused. This information is not intended to replace advice given to you by your health care provider. Make sure you discuss any questions you have with your  health care provider. Document Revised: 07/30/2021 Document Reviewed: 07/28/2019 Elsevier Patient Education  [place].

## 2022-03-17 ENCOUNTER — Encounter (HOSPITAL_COMMUNITY)
Admission: RE | Admit: 2022-03-17 | Discharge: 2022-03-17 | Disposition: A | Discharge status: Home / Self Care | Payer: Medicare Other | Source: Ambulatory Visit | Attending: Internal Medicine | Admitting: Internal Medicine

## 2022-03-17 DIAGNOSIS — Z01812 Encounter for preprocedural laboratory examination: Secondary | ICD-10-CM | POA: Diagnosis not present

## 2022-03-17 DIAGNOSIS — E119 Type 2 diabetes mellitus without complications: Secondary | ICD-10-CM

## 2022-03-17 LAB — BASIC METABOLIC PANEL
Anion gap: 7 (ref 5–15)
BUN: 13 mg/dL (ref 8–23)
CO2: 29 mmol/L (ref 22–32)
Calcium: 8.9 mg/dL (ref 8.9–10.3)
Chloride: 101 mmol/L (ref 98–111)
Creatinine, Ser: 1.12 mg/dL — ABNORMAL HIGH (ref 0.44–1.00)
GFR, Estimated: 49 mL/min — ABNORMAL LOW (ref >60)
Glucose, Bld: 123 mg/dL — ABNORMAL HIGH (ref 70–99)
Potassium: 3.1 mmol/L — ABNORMAL LOW (ref 3.5–5.1)
Sodium: 137 mmol/L (ref 135–145)

## 2022-03-20 ENCOUNTER — Encounter (HOSPITAL_COMMUNITY): Payer: Self-pay

## 2022-03-20 ENCOUNTER — Encounter (HOSPITAL_COMMUNITY)
Admission: RE | Disposition: A | Discharge status: Home / Self Care | Payer: Self-pay | Source: Home / Self Care | Attending: Internal Medicine

## 2022-03-20 ENCOUNTER — Ambulatory Visit (HOSPITAL_BASED_OUTPATIENT_CLINIC_OR_DEPARTMENT_OTHER): Payer: Medicare Other | Admitting: Certified Registered Nurse Anesthetist

## 2022-03-20 ENCOUNTER — Ambulatory Visit (HOSPITAL_COMMUNITY): Payer: Medicare Other | Admitting: Certified Registered Nurse Anesthetist

## 2022-03-20 ENCOUNTER — Ambulatory Visit (HOSPITAL_COMMUNITY)
Admission: RE | Admit: 2022-03-20 | Discharge: 2022-03-20 | Disposition: A | Discharge status: Home / Self Care | Payer: Medicare Other | Attending: Internal Medicine | Admitting: Internal Medicine

## 2022-03-20 DIAGNOSIS — E039 Hypothyroidism, unspecified: Secondary | ICD-10-CM

## 2022-03-20 DIAGNOSIS — Z7984 Long term (current) use of oral hypoglycemic drugs: Secondary | ICD-10-CM | POA: Insufficient documentation

## 2022-03-20 DIAGNOSIS — G4733 Obstructive sleep apnea (adult) (pediatric): Secondary | ICD-10-CM | POA: Diagnosis not present

## 2022-03-20 DIAGNOSIS — R1310 Dysphagia, unspecified: Secondary | ICD-10-CM

## 2022-03-20 DIAGNOSIS — I4819 Other persistent atrial fibrillation: Secondary | ICD-10-CM | POA: Diagnosis not present

## 2022-03-20 DIAGNOSIS — I5032 Chronic diastolic (congestive) heart failure: Secondary | ICD-10-CM | POA: Insufficient documentation

## 2022-03-20 DIAGNOSIS — Z79899 Other long term (current) drug therapy: Secondary | ICD-10-CM | POA: Diagnosis not present

## 2022-03-20 DIAGNOSIS — I5030 Unspecified diastolic (congestive) heart failure: Secondary | ICD-10-CM | POA: Diagnosis not present

## 2022-03-20 DIAGNOSIS — K219 Gastro-esophageal reflux disease without esophagitis: Secondary | ICD-10-CM | POA: Insufficient documentation

## 2022-03-20 DIAGNOSIS — M1990 Unspecified osteoarthritis, unspecified site: Secondary | ICD-10-CM | POA: Diagnosis not present

## 2022-03-20 DIAGNOSIS — I110 Hypertensive heart disease with heart failure: Secondary | ICD-10-CM | POA: Diagnosis not present

## 2022-03-20 DIAGNOSIS — Z7901 Long term (current) use of anticoagulants: Secondary | ICD-10-CM | POA: Diagnosis not present

## 2022-03-20 DIAGNOSIS — Z8711 Personal history of peptic ulcer disease: Secondary | ICD-10-CM | POA: Diagnosis not present

## 2022-03-20 DIAGNOSIS — Z8719 Personal history of other diseases of the digestive system: Secondary | ICD-10-CM | POA: Insufficient documentation

## 2022-03-20 DIAGNOSIS — E119 Type 2 diabetes mellitus without complications: Secondary | ICD-10-CM | POA: Diagnosis not present

## 2022-03-20 HISTORY — PX: ESOPHAGOGASTRODUODENOSCOPY (EGD) WITH PROPOFOL: SHX5813

## 2022-03-20 SURGERY — ESOPHAGOGASTRODUODENOSCOPY (EGD) WITH PROPOFOL
Anesthesia: General

## 2022-03-20 MED ORDER — LACTATED RINGERS IV SOLN
INTRAVENOUS | Status: DC
  Administered 2022-03-20: 1000 mL via INTRAVENOUS

## 2022-03-20 MED ORDER — PROPOFOL 10 MG/ML IV BOLUS
INTRAVENOUS | Status: DC | PRN
  Administered 2022-03-20: 80 mg via INTRAVENOUS

## 2022-03-20 MED ORDER — LIDOCAINE HCL (CARDIAC) PF 100 MG/5ML IV SOSY
PREFILLED_SYRINGE | INTRAVENOUS | Status: DC | PRN
  Administered 2022-03-20: 60 mg via INTRATRACHEAL

## 2022-03-20 MED ORDER — LACTATED RINGERS IV SOLN: INTRAVENOUS | Status: DC | PRN

## 2022-03-20 NOTE — Transfer of Care (Signed)
Immediate Anesthesia Transfer of Care Note  Patient: Christina Frey  Procedure(s) Performed: ESOPHAGOGASTRODUODENOSCOPY (EGD) WITH PROPOFOL  Patient Location: Short Stay  Anesthesia Type:General  Level of Consciousness: sedated  Airway & Oxygen Therapy: Patient Spontanous Breathing  Post-op Assessment: Report given to RN and Post -op Vital signs reviewed and stable  Post vital signs: Reviewed and stable  Last Vitals:  Vitals Value Taken Time  BP    Temp 36.5 C 03/20/22 1200  Pulse 64 03/20/22 1200  Resp 23 03/20/22 1200  SpO2 90 % 03/20/22 1215    Last Pain:  Vitals:   03/20/22 1200  TempSrc:   PainSc: 0-No pain      Patients Stated Pain Goal: 7 (03/20/22 1126)  Complications: No notable events documented.

## 2022-03-20 NOTE — Discharge Instructions (Addendum)
EGD Discharge instructions Please read the instructions outlined below and refer to this sheet in the next few weeks. These discharge instructions provide you with general information on caring for yourself after you leave the hospital. Your doctor may also give you specific instructions. While your treatment has been planned according to the most current medical practices available, unavoidable complications occasionally occur. If you have any problems or questions after discharge, please call your doctor. ACTIVITY You may resume your regular activity but move at a slower pace for the next 24 hours.  Take frequent rest periods for the next 24 hours.  Walking will help expel (get rid of) the air and reduce the bloated feeling in your abdomen.  No driving for 24 hours (because of the anesthesia (medicine) used during the test).  You may shower.  Do not sign any important legal documents or operate any machinery for 24 hours (because of the anesthesia used during the test).  NUTRITION Drink plenty of fluids.  You may resume your normal diet.  Begin with a light meal and progress to your normal diet.  Avoid alcoholic beverages for 24 hours or as instructed by your caregiver.  MEDICATIONS You may resume your normal medications unless your caregiver tells you otherwise.  WHAT YOU CAN EXPECT TODAY You may experience abdominal discomfort such as a feeling of fullness or "gas" pains.  FOLLOW-UP Your doctor will discuss the results of your test with you.  SEEK IMMEDIATE MEDICAL ATTENTION IF ANY OF THE FOLLOWING OCCUR: Excessive nausea (feeling sick to your stomach) and/or vomiting.  Severe abdominal pain and distention (swelling).  Trouble swallowing.  Temperature over 101 F (37.8 C).  Rectal bleeding or vomiting of blood.   Overall your exam today looked good.  The previously noted esophageal web was not seen today.  I believe the pantoprazole twice daily is helping.  We will continue.  Your  stomach and small bowel appeared normal.  Follow-up with GI in 6 months.   I hope you have a great rest of your week!  Hennie Duos. Marletta Lor, D.O. Gastroenterology and Hepatology [facility]

## 2022-03-20 NOTE — Anesthesia Preprocedure Evaluation (Signed)
Anesthesia Evaluation  Patient identified by MRN, date of birth, ID band Patient awake    Reviewed: Allergy & Precautions, NPO status , Patient's Chart, lab work & pertinent test results  Airway Mallampati: II  TM Distance: >3 FB Neck ROM: Full    Dental  (+) Edentulous Upper, Edentulous Lower   Pulmonary sleep apnea ,    Pulmonary exam normal breath sounds clear to auscultation       Cardiovascular Exercise Tolerance: Poor hypertension, Pt. on medications +CHF (hx of)  Normal cardiovascular exam Rhythm:Regular Rate:Normal  1. Left ventricular ejection fraction, by estimation, is 65 to 70%. The left ventricle has normal function. The left ventricle has no regional wall motion abnormalities. Left ventricular diastolic parameters are indeterminate. There is the interventricular septum is flattened in systole and diastole, consistent with right ventricular pressure and volume overload.  2. Right ventricular systolic function is mildly reduced. The right ventricular size is moderately enlarged. There is moderately elevated pulmonary artery systolic pressure. The estimated right ventricular  systolic pressure is 49.7 mmHg.  3. Left atrial size was severely dilated.  4. Right atrial size was severely dilated.  5. A small pericardial effusion is present. The pericardial effusion is posterior to the left ventricle.  6. The mitral valve is abnormal. Mild mitral valve regurgitation.  Moderate mitral annular calcification.  7. Tricuspid valve regurgitation is mild to moderate.  8. The aortic valve is tricuspid. Aortic valve regurgitation is not visualized. Mild to moderate aortic valve sclerosis/calcification is present, without any evidence of aortic stenosis. Aortic valve mean  gradient measures 3.0 mmHg.  9. The inferior vena cava is dilated in size with >50% respiratory variability, suggesting right atrial pressure of 8 mmHg.  10.  Evidence of atrial level shunting detected by color flow Doppler. There is a moderately sized secundum atrial septal defect with predominantly left to right shunting across the atrial septum.   Comparison(s): Prior images unable to be directly viewed.    Neuro/Psych negative neurological ROS  negative psych ROS   GI/Hepatic Neg liver ROS, PUD, GERD  Medicated,  Endo/Other  diabetes, Well Controlled, Type 2, Oral Hypoglycemic AgentsHypothyroidism   Renal/GU negative Renal ROS  negative genitourinary   Musculoskeletal  (+) Arthritis , Osteoarthritis,    Abdominal   Peds negative pediatric ROS (+)  Hematology negative hematology ROS (+)   Anesthesia Other Findings   Reproductive/Obstetrics negative OB ROS                             Anesthesia Physical  Anesthesia Plan  ASA: 3  Anesthesia Plan: General   Post-op Pain Management:    Induction: Intravenous  PONV Risk Score and Plan: 2 and Propofol infusion and TIVA  Airway Management Planned: Nasal Cannula and Natural Airway  Additional Equipment:   Intra-op Plan:   Post-operative Plan:   Informed Consent: I have reviewed the patients History and Physical, chart, labs and discussed the procedure including the risks, benefits and alternatives for the proposed anesthesia with the patient or authorized representative who has indicated his/her understanding and acceptance.     Dental advisory given  Plan Discussed with: CRNA  Anesthesia Plan Comments:         Anesthesia Quick Evaluation

## 2022-03-20 NOTE — Interval H&P Note (Signed)
History and Physical Interval Note:  03/20/2022 11:34 AM  Christina Frey  has presented today for surgery, with the diagnosis of dysphagia.  The various methods of treatment have been discussed with the patient and family. After consideration of risks, benefits and other options for treatment, the patient has consented to  Procedure(s) with comments: ESOPHAGOGASTRODUODENOSCOPY (EGD) WITH PROPOFOL (N/A) - 1:30pm, LM to try to move pt up BALLOON DILATION (N/A) as a surgical intervention.  The patient's history has been reviewed, patient examined, no change in status, stable for surgery.  I have reviewed the patient's chart and labs.  Questions were answered to the patient's satisfaction.     [name]

## 2022-03-20 NOTE — Op Note (Signed)
[facility] Patient Name: Christina Frey Procedure Date: 03/20/2022 11:24 AM MRN: [id number] Date of Birth: 1941-02-09 Attending MD: Elon Alas. Abbey Chatters DO CSN: [id number] Age: 81 Admit Type: Outpatient Procedure:                Upper GI endoscopy Indications:              Dysphagia Providers:                Elon Alas. Abbey Chatters, DO, Crystal Page, Randa Spike, Technician, Everardo Pacific Referring MD:              Medicines:                See the Anesthesia note for documentation of the                            administered medications Complications:            No immediate complications. Estimated Blood Loss:     Estimated blood loss: none. Procedure:                Pre-Anesthesia Assessment:                           - The anesthesia plan was to use monitored                            anesthesia care (MAC).                           After obtaining informed consent, the endoscope was                            passed under direct vision. Throughout the                            procedure, the patient's blood pressure, pulse, and                            oxygen saturations were monitored continuously. The                            [id number]-[id number] ([id number]) scope was introduced through the                            mouth, and advanced to the second part of duodenum.                            The upper GI endoscopy was accomplished without                            difficulty. The patient tolerated the procedure                            well. Scope In: 11:51:50 AM Scope Out:  11:54:29 AM Total Procedure Duration: 0 hours 2 minutes 39 seconds  Findings:      There is no endoscopic evidence of bleeding, areas of erosion,       esophagitis, ulcerations or varices in the entire esophagus. Previously       noted proximal esophageal web not seen on today's exam.      The entire examined stomach was normal.      The duodenal bulb, first portion of the  duodenum and second portion of       the duodenum were normal. Impression:               - Normal stomach.                           - Normal duodenal bulb, first portion of the                            duodenum and second portion of the duodenum.                           - No specimens collected. Moderate Sedation:      Per Anesthesia Care Recommendation:           - Patient has a contact number available for                            emergencies. The signs and symptoms of potential                            delayed complications were discussed with the                            patient. Return to normal activities tomorrow.                            Written discharge instructions were provided to the                            patient.                           - Resume previous diet.                           - Continue present medications.                           - Use Protonix (pantoprazole) 40 mg PO BID.                           - Return to GI clinic in 6 months. Procedure Code(s):        --- Professional ---                           [phone], Esophagogastroduodenoscopy, flexible,  transoral; diagnostic, including collection of                            specimen(s) by brushing or washing, when performed                            (separate procedure) Diagnosis Code(s):        --- Professional ---                           R13.10, Dysphagia, unspecified CPT copyright 2019 American Medical Association. All rights reserved. The codes documented in this report are preliminary and upon coder review may  be revised to meet current compliance requirements. Elon Alas. Abbey Chatters, DO [place] Abbey Chatters, DO 03/20/2022 11:56:55 AM This report has been signed electronically. Number of Addenda: 0

## 2022-03-20 NOTE — Interval H&P Note (Signed)
History and Physical Interval Note:  03/20/2022 11:37 AM  Christina Frey  has presented today for surgery, with the diagnosis of dysphagia.  The various methods of treatment have been discussed with the patient and family. After consideration of risks, benefits and other options for treatment, the patient has consented to  Procedure(s) with comments: ESOPHAGOGASTRODUODENOSCOPY (EGD) WITH PROPOFOL (N/A) - 1:30pm, LM to try to move pt up BALLOON DILATION (N/A) as a surgical intervention.  The patient's history has been reviewed, patient examined, no change in status, stable for surgery.  I have reviewed the patient's chart and labs.  Questions were answered to the patient's satisfaction.     [name]

## 2022-03-20 NOTE — Anesthesia Postprocedure Evaluation (Signed)
Anesthesia Post Note  Patient: Christina Frey  Procedure(s) Performed: ESOPHAGOGASTRODUODENOSCOPY (EGD) WITH PROPOFOL  Patient location during evaluation: Phase II Anesthesia Type: General Level of consciousness: awake and alert Pain management: pain level controlled Vital Signs Assessment: post-procedure vital signs reviewed and stable Respiratory status: spontaneous breathing, nonlabored ventilation, respiratory function stable and patient connected to nasal cannula oxygen Cardiovascular status: blood pressure returned to baseline and stable Postop Assessment: no apparent nausea or vomiting Anesthetic complications: no   There were no known notable events for this encounter.   Last Vitals:  Vitals:   03/20/22 1230 03/20/22 1238  BP: (!) 92/45 103/68  Pulse:    Resp:    Temp:    SpO2:      Last Pain:  Vitals:   03/20/22 1200  TempSrc:   PainSc: 0-No pain                 Glynis Smiles

## 2022-03-26 ENCOUNTER — Encounter (HOSPITAL_COMMUNITY): Payer: Self-pay | Admitting: Internal Medicine

## 2022-05-16 ENCOUNTER — Other Ambulatory Visit: Payer: Self-pay | Admitting: Cardiology

## 2022-05-28 DIAGNOSIS — Z23 Encounter for immunization: Secondary | ICD-10-CM | POA: Diagnosis not present

## 2022-05-28 DIAGNOSIS — Z1331 Encounter for screening for depression: Secondary | ICD-10-CM | POA: Diagnosis not present

## 2022-05-28 DIAGNOSIS — N390 Urinary tract infection, site not specified: Secondary | ICD-10-CM | POA: Diagnosis not present

## 2022-05-28 DIAGNOSIS — Z0001 Encounter for general adult medical examination with abnormal findings: Secondary | ICD-10-CM | POA: Diagnosis not present

## 2022-05-28 DIAGNOSIS — E1129 Type 2 diabetes mellitus with other diabetic kidney complication: Secondary | ICD-10-CM | POA: Diagnosis not present

## 2022-05-28 DIAGNOSIS — E6609 Other obesity due to excess calories: Secondary | ICD-10-CM | POA: Diagnosis not present

## 2022-05-28 DIAGNOSIS — Z6834 Body mass index (BMI) 34.0-34.9, adult: Secondary | ICD-10-CM | POA: Diagnosis not present

## 2022-05-28 DIAGNOSIS — G4730 Sleep apnea, unspecified: Secondary | ICD-10-CM | POA: Diagnosis not present

## 2022-05-28 DIAGNOSIS — I4891 Unspecified atrial fibrillation: Secondary | ICD-10-CM | POA: Diagnosis not present

## 2022-05-28 DIAGNOSIS — G894 Chronic pain syndrome: Secondary | ICD-10-CM | POA: Diagnosis not present

## 2022-05-28 DIAGNOSIS — I10 Essential (primary) hypertension: Secondary | ICD-10-CM | POA: Diagnosis not present

## 2022-05-28 DIAGNOSIS — I5030 Unspecified diastolic (congestive) heart failure: Secondary | ICD-10-CM | POA: Diagnosis not present

## 2022-05-31 DIAGNOSIS — I110 Hypertensive heart disease with heart failure: Secondary | ICD-10-CM | POA: Diagnosis not present

## 2022-05-31 DIAGNOSIS — G4730 Sleep apnea, unspecified: Secondary | ICD-10-CM | POA: Diagnosis not present

## 2022-05-31 DIAGNOSIS — N390 Urinary tract infection, site not specified: Secondary | ICD-10-CM | POA: Diagnosis not present

## 2022-05-31 DIAGNOSIS — Z6832 Body mass index (BMI) 32.0-32.9, adult: Secondary | ICD-10-CM | POA: Diagnosis not present

## 2022-05-31 DIAGNOSIS — Z7901 Long term (current) use of anticoagulants: Secondary | ICD-10-CM | POA: Diagnosis not present

## 2022-05-31 DIAGNOSIS — R32 Unspecified urinary incontinence: Secondary | ICD-10-CM | POA: Diagnosis not present

## 2022-05-31 DIAGNOSIS — I5030 Unspecified diastolic (congestive) heart failure: Secondary | ICD-10-CM | POA: Diagnosis not present

## 2022-05-31 DIAGNOSIS — I4891 Unspecified atrial fibrillation: Secondary | ICD-10-CM | POA: Diagnosis not present

## 2022-05-31 DIAGNOSIS — G894 Chronic pain syndrome: Secondary | ICD-10-CM | POA: Diagnosis not present

## 2022-05-31 DIAGNOSIS — E039 Hypothyroidism, unspecified: Secondary | ICD-10-CM | POA: Diagnosis not present

## 2022-05-31 DIAGNOSIS — E1129 Type 2 diabetes mellitus with other diabetic kidney complication: Secondary | ICD-10-CM | POA: Diagnosis not present

## 2022-05-31 DIAGNOSIS — E1140 Type 2 diabetes mellitus with diabetic neuropathy, unspecified: Secondary | ICD-10-CM | POA: Diagnosis not present

## 2022-06-03 DIAGNOSIS — I110 Hypertensive heart disease with heart failure: Secondary | ICD-10-CM | POA: Diagnosis not present

## 2022-06-03 DIAGNOSIS — I5030 Unspecified diastolic (congestive) heart failure: Secondary | ICD-10-CM | POA: Diagnosis not present

## 2022-06-03 DIAGNOSIS — I4891 Unspecified atrial fibrillation: Secondary | ICD-10-CM | POA: Diagnosis not present

## 2022-06-03 DIAGNOSIS — E1129 Type 2 diabetes mellitus with other diabetic kidney complication: Secondary | ICD-10-CM | POA: Diagnosis not present

## 2022-06-03 DIAGNOSIS — E1140 Type 2 diabetes mellitus with diabetic neuropathy, unspecified: Secondary | ICD-10-CM | POA: Diagnosis not present

## 2022-06-03 DIAGNOSIS — N390 Urinary tract infection, site not specified: Secondary | ICD-10-CM | POA: Diagnosis not present

## 2022-06-05 DIAGNOSIS — I110 Hypertensive heart disease with heart failure: Secondary | ICD-10-CM | POA: Diagnosis not present

## 2022-06-05 DIAGNOSIS — E1140 Type 2 diabetes mellitus with diabetic neuropathy, unspecified: Secondary | ICD-10-CM | POA: Diagnosis not present

## 2022-06-05 DIAGNOSIS — N390 Urinary tract infection, site not specified: Secondary | ICD-10-CM | POA: Diagnosis not present

## 2022-06-05 DIAGNOSIS — E1129 Type 2 diabetes mellitus with other diabetic kidney complication: Secondary | ICD-10-CM | POA: Diagnosis not present

## 2022-06-05 DIAGNOSIS — I5030 Unspecified diastolic (congestive) heart failure: Secondary | ICD-10-CM | POA: Diagnosis not present

## 2022-06-05 DIAGNOSIS — I4891 Unspecified atrial fibrillation: Secondary | ICD-10-CM | POA: Diagnosis not present

## 2022-06-10 ENCOUNTER — Other Ambulatory Visit: Payer: Self-pay | Admitting: Internal Medicine

## 2022-06-11 DIAGNOSIS — N390 Urinary tract infection, site not specified: Secondary | ICD-10-CM | POA: Diagnosis not present

## 2022-06-11 DIAGNOSIS — E1129 Type 2 diabetes mellitus with other diabetic kidney complication: Secondary | ICD-10-CM | POA: Diagnosis not present

## 2022-06-11 DIAGNOSIS — I4891 Unspecified atrial fibrillation: Secondary | ICD-10-CM | POA: Diagnosis not present

## 2022-06-11 DIAGNOSIS — E1140 Type 2 diabetes mellitus with diabetic neuropathy, unspecified: Secondary | ICD-10-CM | POA: Diagnosis not present

## 2022-06-11 DIAGNOSIS — I110 Hypertensive heart disease with heart failure: Secondary | ICD-10-CM | POA: Diagnosis not present

## 2022-06-11 DIAGNOSIS — I5030 Unspecified diastolic (congestive) heart failure: Secondary | ICD-10-CM | POA: Diagnosis not present

## 2022-06-13 DIAGNOSIS — N390 Urinary tract infection, site not specified: Secondary | ICD-10-CM | POA: Diagnosis not present

## 2022-06-13 DIAGNOSIS — E1129 Type 2 diabetes mellitus with other diabetic kidney complication: Secondary | ICD-10-CM | POA: Diagnosis not present

## 2022-06-13 DIAGNOSIS — I110 Hypertensive heart disease with heart failure: Secondary | ICD-10-CM | POA: Diagnosis not present

## 2022-06-13 DIAGNOSIS — I5030 Unspecified diastolic (congestive) heart failure: Secondary | ICD-10-CM | POA: Diagnosis not present

## 2022-06-13 DIAGNOSIS — E1140 Type 2 diabetes mellitus with diabetic neuropathy, unspecified: Secondary | ICD-10-CM | POA: Diagnosis not present

## 2022-06-13 DIAGNOSIS — I4891 Unspecified atrial fibrillation: Secondary | ICD-10-CM | POA: Diagnosis not present

## 2022-06-17 ENCOUNTER — Other Ambulatory Visit: Payer: Self-pay | Admitting: Gastroenterology

## 2022-06-17 DIAGNOSIS — E1140 Type 2 diabetes mellitus with diabetic neuropathy, unspecified: Secondary | ICD-10-CM | POA: Diagnosis not present

## 2022-06-17 DIAGNOSIS — I4891 Unspecified atrial fibrillation: Secondary | ICD-10-CM | POA: Diagnosis not present

## 2022-06-17 DIAGNOSIS — I110 Hypertensive heart disease with heart failure: Secondary | ICD-10-CM | POA: Diagnosis not present

## 2022-06-17 DIAGNOSIS — E1129 Type 2 diabetes mellitus with other diabetic kidney complication: Secondary | ICD-10-CM | POA: Diagnosis not present

## 2022-06-17 DIAGNOSIS — I5030 Unspecified diastolic (congestive) heart failure: Secondary | ICD-10-CM | POA: Diagnosis not present

## 2022-06-17 DIAGNOSIS — N390 Urinary tract infection, site not specified: Secondary | ICD-10-CM | POA: Diagnosis not present

## 2022-06-19 ENCOUNTER — Other Ambulatory Visit: Payer: Self-pay | Admitting: Student

## 2022-06-30 DIAGNOSIS — E039 Hypothyroidism, unspecified: Secondary | ICD-10-CM | POA: Diagnosis not present

## 2022-06-30 DIAGNOSIS — Z6832 Body mass index (BMI) 32.0-32.9, adult: Secondary | ICD-10-CM | POA: Diagnosis not present

## 2022-06-30 DIAGNOSIS — G894 Chronic pain syndrome: Secondary | ICD-10-CM | POA: Diagnosis not present

## 2022-06-30 DIAGNOSIS — I110 Hypertensive heart disease with heart failure: Secondary | ICD-10-CM | POA: Diagnosis not present

## 2022-06-30 DIAGNOSIS — N390 Urinary tract infection, site not specified: Secondary | ICD-10-CM | POA: Diagnosis not present

## 2022-06-30 DIAGNOSIS — I4891 Unspecified atrial fibrillation: Secondary | ICD-10-CM | POA: Diagnosis not present

## 2022-06-30 DIAGNOSIS — E1140 Type 2 diabetes mellitus with diabetic neuropathy, unspecified: Secondary | ICD-10-CM | POA: Diagnosis not present

## 2022-06-30 DIAGNOSIS — I5030 Unspecified diastolic (congestive) heart failure: Secondary | ICD-10-CM | POA: Diagnosis not present

## 2022-06-30 DIAGNOSIS — G4730 Sleep apnea, unspecified: Secondary | ICD-10-CM | POA: Diagnosis not present

## 2022-06-30 DIAGNOSIS — R32 Unspecified urinary incontinence: Secondary | ICD-10-CM | POA: Diagnosis not present

## 2022-06-30 DIAGNOSIS — Z7901 Long term (current) use of anticoagulants: Secondary | ICD-10-CM | POA: Diagnosis not present

## 2022-06-30 DIAGNOSIS — E1129 Type 2 diabetes mellitus with other diabetic kidney complication: Secondary | ICD-10-CM | POA: Diagnosis not present

## 2022-07-01 ENCOUNTER — Ambulatory Visit: Payer: Medicare Other | Admitting: Pulmonary Disease

## 2022-07-02 ENCOUNTER — Other Ambulatory Visit: Payer: Self-pay | Admitting: Cardiology

## 2022-07-02 NOTE — Telephone Encounter (Signed)
Prescription refill request for Eliquis received. Indication: AF Last office visit: 11/22/21 Bernerd Pho PA-C Scr: 1.12 on 03/17/22 Age: 81 Weight: 90.7kg  Based on above findings Eliquis 5mg  twice daily is the appropriate dose.  Refill approved.

## 2022-07-07 ENCOUNTER — Ambulatory Visit: Payer: Medicare Other | Admitting: Primary Care

## 2022-07-08 ENCOUNTER — Telehealth: Payer: Self-pay | Admitting: Pulmonary Disease

## 2022-07-08 DIAGNOSIS — N390 Urinary tract infection, site not specified: Secondary | ICD-10-CM | POA: Diagnosis not present

## 2022-07-08 DIAGNOSIS — I110 Hypertensive heart disease with heart failure: Secondary | ICD-10-CM | POA: Diagnosis not present

## 2022-07-08 DIAGNOSIS — E1140 Type 2 diabetes mellitus with diabetic neuropathy, unspecified: Secondary | ICD-10-CM | POA: Diagnosis not present

## 2022-07-08 DIAGNOSIS — I4891 Unspecified atrial fibrillation: Secondary | ICD-10-CM | POA: Diagnosis not present

## 2022-07-08 DIAGNOSIS — E1129 Type 2 diabetes mellitus with other diabetic kidney complication: Secondary | ICD-10-CM | POA: Diagnosis not present

## 2022-07-08 DIAGNOSIS — I5030 Unspecified diastolic (congestive) heart failure: Secondary | ICD-10-CM | POA: Diagnosis not present

## 2022-07-15 ENCOUNTER — Other Ambulatory Visit: Payer: Self-pay | Admitting: Student

## 2022-07-15 DIAGNOSIS — I5030 Unspecified diastolic (congestive) heart failure: Secondary | ICD-10-CM | POA: Diagnosis not present

## 2022-07-15 DIAGNOSIS — N390 Urinary tract infection, site not specified: Secondary | ICD-10-CM | POA: Diagnosis not present

## 2022-07-15 DIAGNOSIS — I4891 Unspecified atrial fibrillation: Secondary | ICD-10-CM | POA: Diagnosis not present

## 2022-07-15 DIAGNOSIS — E1129 Type 2 diabetes mellitus with other diabetic kidney complication: Secondary | ICD-10-CM | POA: Diagnosis not present

## 2022-07-15 DIAGNOSIS — I110 Hypertensive heart disease with heart failure: Secondary | ICD-10-CM | POA: Diagnosis not present

## 2022-07-15 DIAGNOSIS — E1140 Type 2 diabetes mellitus with diabetic neuropathy, unspecified: Secondary | ICD-10-CM | POA: Diagnosis not present

## 2022-07-18 DIAGNOSIS — N390 Urinary tract infection, site not specified: Secondary | ICD-10-CM | POA: Diagnosis not present

## 2022-07-18 DIAGNOSIS — I4891 Unspecified atrial fibrillation: Secondary | ICD-10-CM | POA: Diagnosis not present

## 2022-07-18 DIAGNOSIS — E1140 Type 2 diabetes mellitus with diabetic neuropathy, unspecified: Secondary | ICD-10-CM | POA: Diagnosis not present

## 2022-07-18 DIAGNOSIS — I110 Hypertensive heart disease with heart failure: Secondary | ICD-10-CM | POA: Diagnosis not present

## 2022-07-18 DIAGNOSIS — I5030 Unspecified diastolic (congestive) heart failure: Secondary | ICD-10-CM | POA: Diagnosis not present

## 2022-07-18 DIAGNOSIS — E1129 Type 2 diabetes mellitus with other diabetic kidney complication: Secondary | ICD-10-CM | POA: Diagnosis not present

## 2022-07-18 NOTE — Telephone Encounter (Signed)
Patient cancelled appt for 10/24. She is scheduled for a visit on 12/19 in [place].   Will close this encounter.

## 2022-07-23 DIAGNOSIS — N390 Urinary tract infection, site not specified: Secondary | ICD-10-CM | POA: Diagnosis not present

## 2022-07-23 DIAGNOSIS — I5030 Unspecified diastolic (congestive) heart failure: Secondary | ICD-10-CM | POA: Diagnosis not present

## 2022-07-23 DIAGNOSIS — E1129 Type 2 diabetes mellitus with other diabetic kidney complication: Secondary | ICD-10-CM | POA: Diagnosis not present

## 2022-07-23 DIAGNOSIS — I4891 Unspecified atrial fibrillation: Secondary | ICD-10-CM | POA: Diagnosis not present

## 2022-07-23 DIAGNOSIS — E1140 Type 2 diabetes mellitus with diabetic neuropathy, unspecified: Secondary | ICD-10-CM | POA: Diagnosis not present

## 2022-07-23 DIAGNOSIS — I110 Hypertensive heart disease with heart failure: Secondary | ICD-10-CM | POA: Diagnosis not present

## 2022-07-24 ENCOUNTER — Other Ambulatory Visit: Payer: Self-pay | Admitting: Student

## 2022-07-27 DIAGNOSIS — I5030 Unspecified diastolic (congestive) heart failure: Secondary | ICD-10-CM | POA: Diagnosis not present

## 2022-07-27 DIAGNOSIS — N390 Urinary tract infection, site not specified: Secondary | ICD-10-CM | POA: Diagnosis not present

## 2022-07-27 DIAGNOSIS — E1140 Type 2 diabetes mellitus with diabetic neuropathy, unspecified: Secondary | ICD-10-CM | POA: Diagnosis not present

## 2022-07-27 DIAGNOSIS — I4891 Unspecified atrial fibrillation: Secondary | ICD-10-CM | POA: Diagnosis not present

## 2022-07-27 DIAGNOSIS — E1129 Type 2 diabetes mellitus with other diabetic kidney complication: Secondary | ICD-10-CM | POA: Diagnosis not present

## 2022-07-27 DIAGNOSIS — I110 Hypertensive heart disease with heart failure: Secondary | ICD-10-CM | POA: Diagnosis not present

## 2022-07-28 ENCOUNTER — Telehealth: Payer: Self-pay | Admitting: Pulmonary Disease

## 2022-07-28 NOTE — Telephone Encounter (Signed)
Printed off last office notes with patient and Dr Vassie Loll. Called Burna Mortimer and verified her fax number and that she wanted face to face notes. Nothing further needed

## 2022-07-29 ENCOUNTER — Other Ambulatory Visit: Payer: Self-pay | Admitting: Student

## 2022-08-26 ENCOUNTER — Ambulatory Visit (INDEPENDENT_AMBULATORY_CARE_PROVIDER_SITE_OTHER): Payer: Medicare Other | Admitting: Pulmonary Disease

## 2022-08-26 ENCOUNTER — Encounter: Payer: Self-pay | Admitting: Pulmonary Disease

## 2022-08-26 VITALS — BP 134/78 | HR 78 | Temp 98.3°F | Ht 62.0 in | Wt 188.2 lb

## 2022-08-26 DIAGNOSIS — I2729 Other secondary pulmonary hypertension: Secondary | ICD-10-CM

## 2022-08-26 DIAGNOSIS — G4734 Idiopathic sleep related nonobstructive alveolar hypoventilation: Secondary | ICD-10-CM

## 2022-08-26 DIAGNOSIS — G4733 Obstructive sleep apnea (adult) (pediatric): Secondary | ICD-10-CM

## 2022-08-26 NOTE — Assessment & Plan Note (Signed)
Likely WHO 2 She has PFO with shunt Continue torsemide.  Will reassess for oxygen. She will follow-up with cardiology

## 2022-08-26 NOTE — Progress Notes (Signed)
   Subjective:    Patient ID: Christina Frey, female    DOB: [birth year], 81 y.o.   MRN: [id number]  HPI  81 yo never smoker for follow-up of OSA, nocturnal hypoxia and moderate pulmonary hypertension   PMH - persistent atrial fibrillation, HFpEF, ASD, pulmonary HTN, HTN, Hypothyroidism Elevated right hemidiaphragm   Chief Complaint  Patient presents with   Follow-up    Cpap going well. Has had increased SOB for the last week    35-month follow-up visit. After her initial office visit in February, we set her up with CPAP machine auto settings 5 to 12 cm.  She is struggled with it but overall states that she is compliant.  We reviewed home sleep test today and subsequent CPAP titration study.  She had significant hypoxia but no oxygen was used during the titration study. We also picked up on esophageal stricture which required dilatation by GI  She is accompanied by her son today, reports shortness of breath and pedal edema, she is compliant with torsemide. She has not seen cardiology since her visit 11/2021.   Significant tests/ events reviewed   01/2022 HST AHI 8/hour, lowest desaturation 55%, sats stayed in the 80% range for most of the study. CPAP titration study 02/2022 7 cm, no oxygen was used 10/2021 esophagram stricture at GE junction Esophagram 2009 smooth narrowing of the distal esophagus   Echo 06/2021 RVSP 49 mm, RV pressure and volume overload, left-to-right shunt  Chest x-ray 10/2016 personally reviewed shows elevated right hemidiaphragm   Review of Systems neg for any significant sore throat, dysphagia, itching, sneezing, nasal congestion or excess/ purulent secretions, fever, chills, sweats, unintended wt loss, pleuritic or exertional cp, hempoptysis, orthopnea pnd or change in chronic leg swelling. Also denies presyncope, palpitations, heartburn, abdominal pain, nausea, vomiting, diarrhea or change in bowel or urinary habits, dysuria,hematuria, rash, arthralgias, visual  complaints, headache, numbness weakness or ataxia.     Objective:   Physical Exam   Gen. Pleasant, obese, in no distress ENT - no lesions, no post nasal drip Neck: No JVD, no thyromegaly, no carotid bruits Lungs: no use of accessory muscles, no dullness to percussion, decreased without rales or rhonchi  Cardiovascular: Rhythm regular, heart sounds  normal, no murmurs or gallops, 2+ peripheral edema Musculoskeletal: No deformities, no cyanosis or clubbing , no tremors        Assessment & Plan:

## 2022-08-26 NOTE — Patient Instructions (Signed)
  CPAP is working well  X check ONO on CPAP/ RA  Cardiology appt

## 2022-08-26 NOTE — Assessment & Plan Note (Signed)
CPAP download was reviewed which shows good control of events on average CPAP pressure of 10 cm on auto settings 5 to 12 cm.  She is reasonably compliant and average of 6 hours per night with a couple of missed nights.  She has a leak but this is acceptable. Overall CPAP is only helped improve her daytime somnolence and fatigue.  Weight loss encouraged, compliance with goal of at least 4-6 hrs every night is the expectation. Advised against medications with sedative side effects Cautioned against driving when sleepy - understanding that sleepiness will vary on a day to day basis

## 2022-08-26 NOTE — Assessment & Plan Note (Signed)
Significant nocturnal hypoxia on initial study and also on titration study but no oxygen was used.  This may be related to pulm hypertension Will recheck nocturnal oximetry on CPAP/room air and started on oxygen if needed

## 2022-08-28 ENCOUNTER — Telehealth: Payer: Self-pay | Admitting: Pulmonary Disease

## 2022-08-28 NOTE — Telephone Encounter (Signed)
[facility], Burna Mortimer,  calling for face to face notes in order to complete RX. Their number is [phone]  Fax [phone]

## 2022-08-28 NOTE — Telephone Encounter (Signed)
Pt's OV notes have been faxed to provided fax number for [facility]. Nothing further needed.

## 2022-09-15 ENCOUNTER — Telehealth: Payer: Self-pay | Admitting: Pulmonary Disease

## 2022-09-15 DIAGNOSIS — G4733 Obstructive sleep apnea (adult) (pediatric): Secondary | ICD-10-CM

## 2022-09-15 NOTE — Telephone Encounter (Signed)
ONO on CPAP/ RA showed significant desaturation for almost 4 hours less than 88%. I think she needs oxygen blended into her CPAP machine but unfortunately she will need a repeat titration study (with oxygen] for DME to provide this I noted she just recently had a titration study done in 02/2022 -Okay to order CPAP titration study with oxygen if patient is willing

## 2022-09-15 NOTE — Telephone Encounter (Signed)
ATC patient.  LMTCB. 

## 2022-09-17 IMAGING — DX DG CHEST 2V
2 series · 2 of 2 positions shown · non-contrast
Comparison: Chest x-ray 08/20/2020.

CLINICAL DATA: Dyspnea.

EXAM:
CHEST - 2 VIEW

[chest pa]
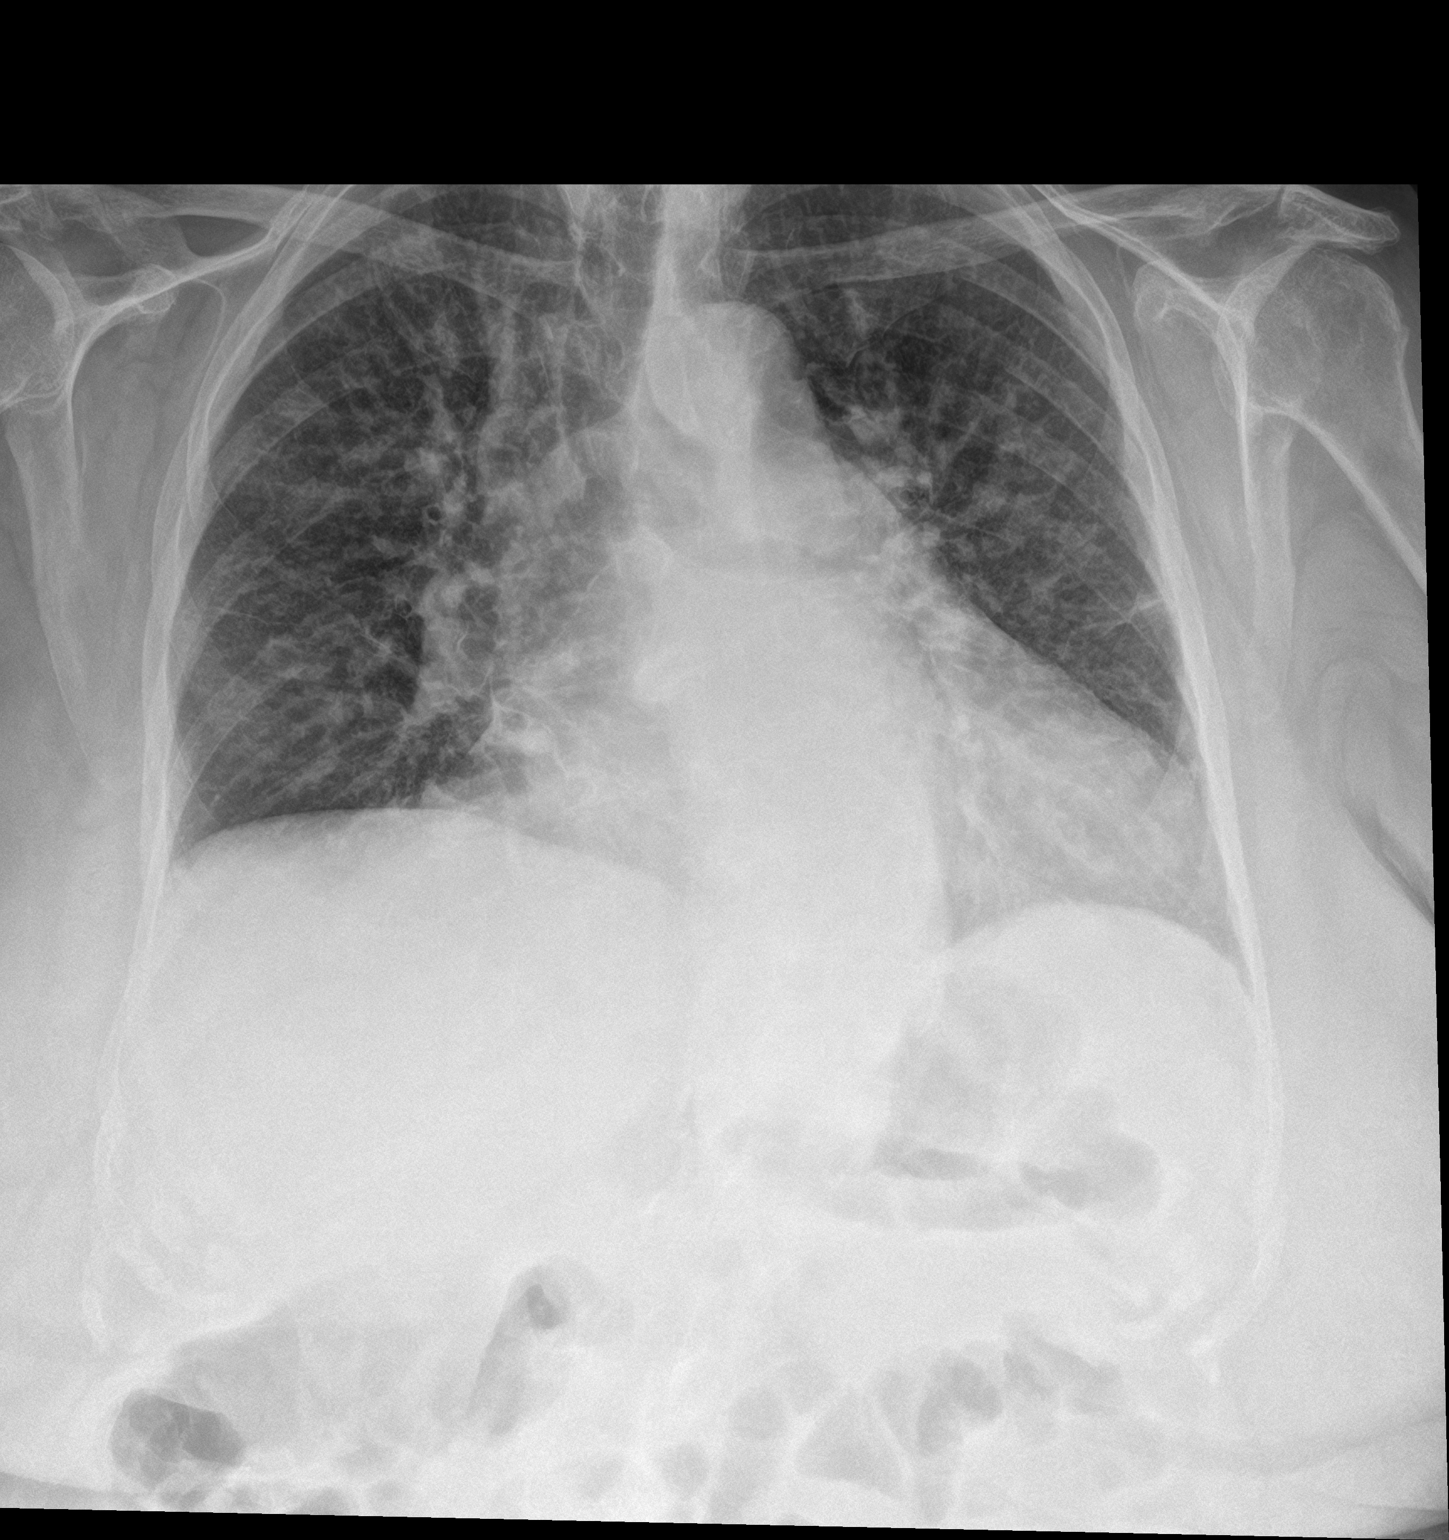

[chest lat]
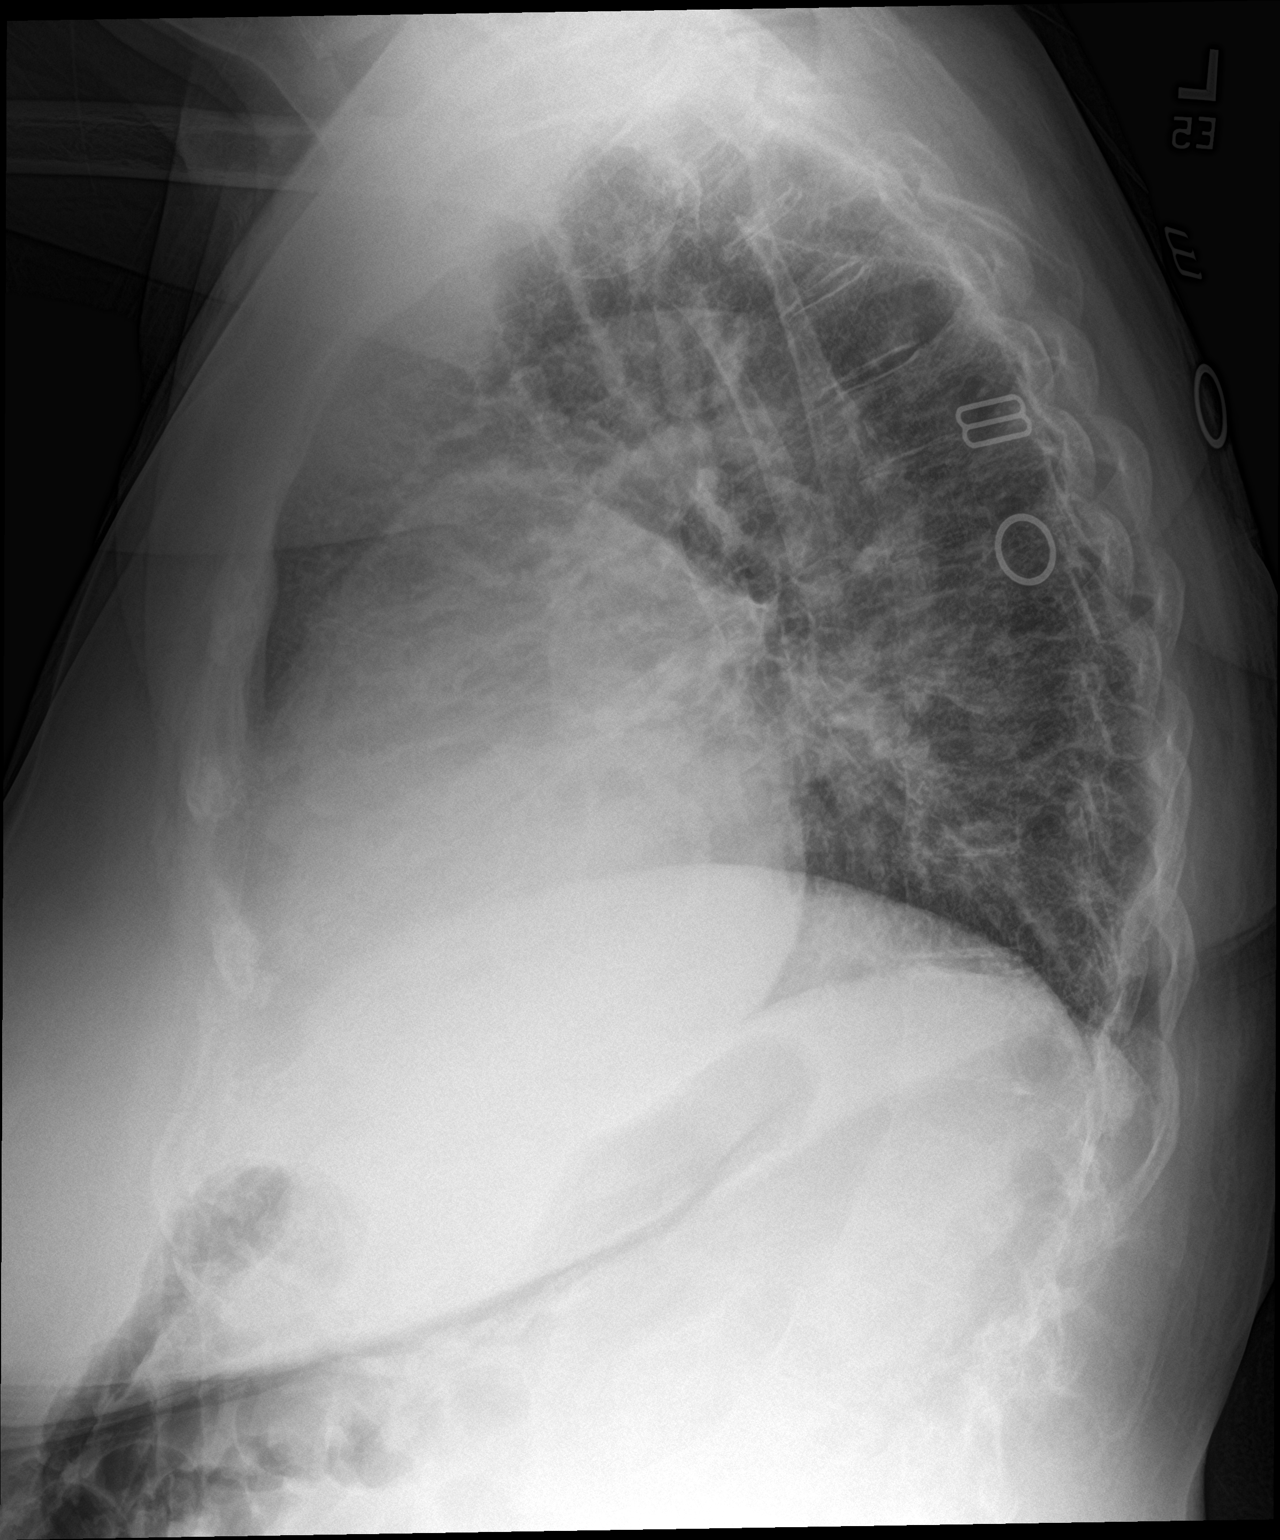

[2 of 2 positions shown; findings below may reference images not displayed]

FINDINGS: Heart is enlarged. There central pulmonary vascular congestion.
There also central interstitial opacities bilaterally. No focal lung
consolidation, pleural effusion or pneumothorax. No acute fractures.
IMPRESSION: 1. Cardiomegaly with pulmonary vascular congestion.
2. Central interstitial opacities may represent mild edema or
infection/inflammation.

## 2022-09-17 NOTE — Telephone Encounter (Signed)
Called and spoke with patients daughter Renea (ok per dpr)  She voiced understanding. CPAP titration order placed.

## 2022-09-17 NOTE — Telephone Encounter (Signed)
PT's daughter is returning call from [place]. Please call back. Thanks.

## 2022-09-22 ENCOUNTER — Encounter: Payer: Self-pay | Admitting: Pulmonary Disease

## 2022-09-23 ENCOUNTER — Ambulatory Visit: Payer: Medicare Other | Admitting: Gastroenterology

## 2022-10-03 IMAGING — DX DG CHEST 1V PORT
1 series · 1 of 1 positions shown · non-contrast
Comparison: 12/06/2021

CLINICAL DATA: Chest pain

EXAM:
PORTABLE CHEST 1 VIEW

[chest ap]
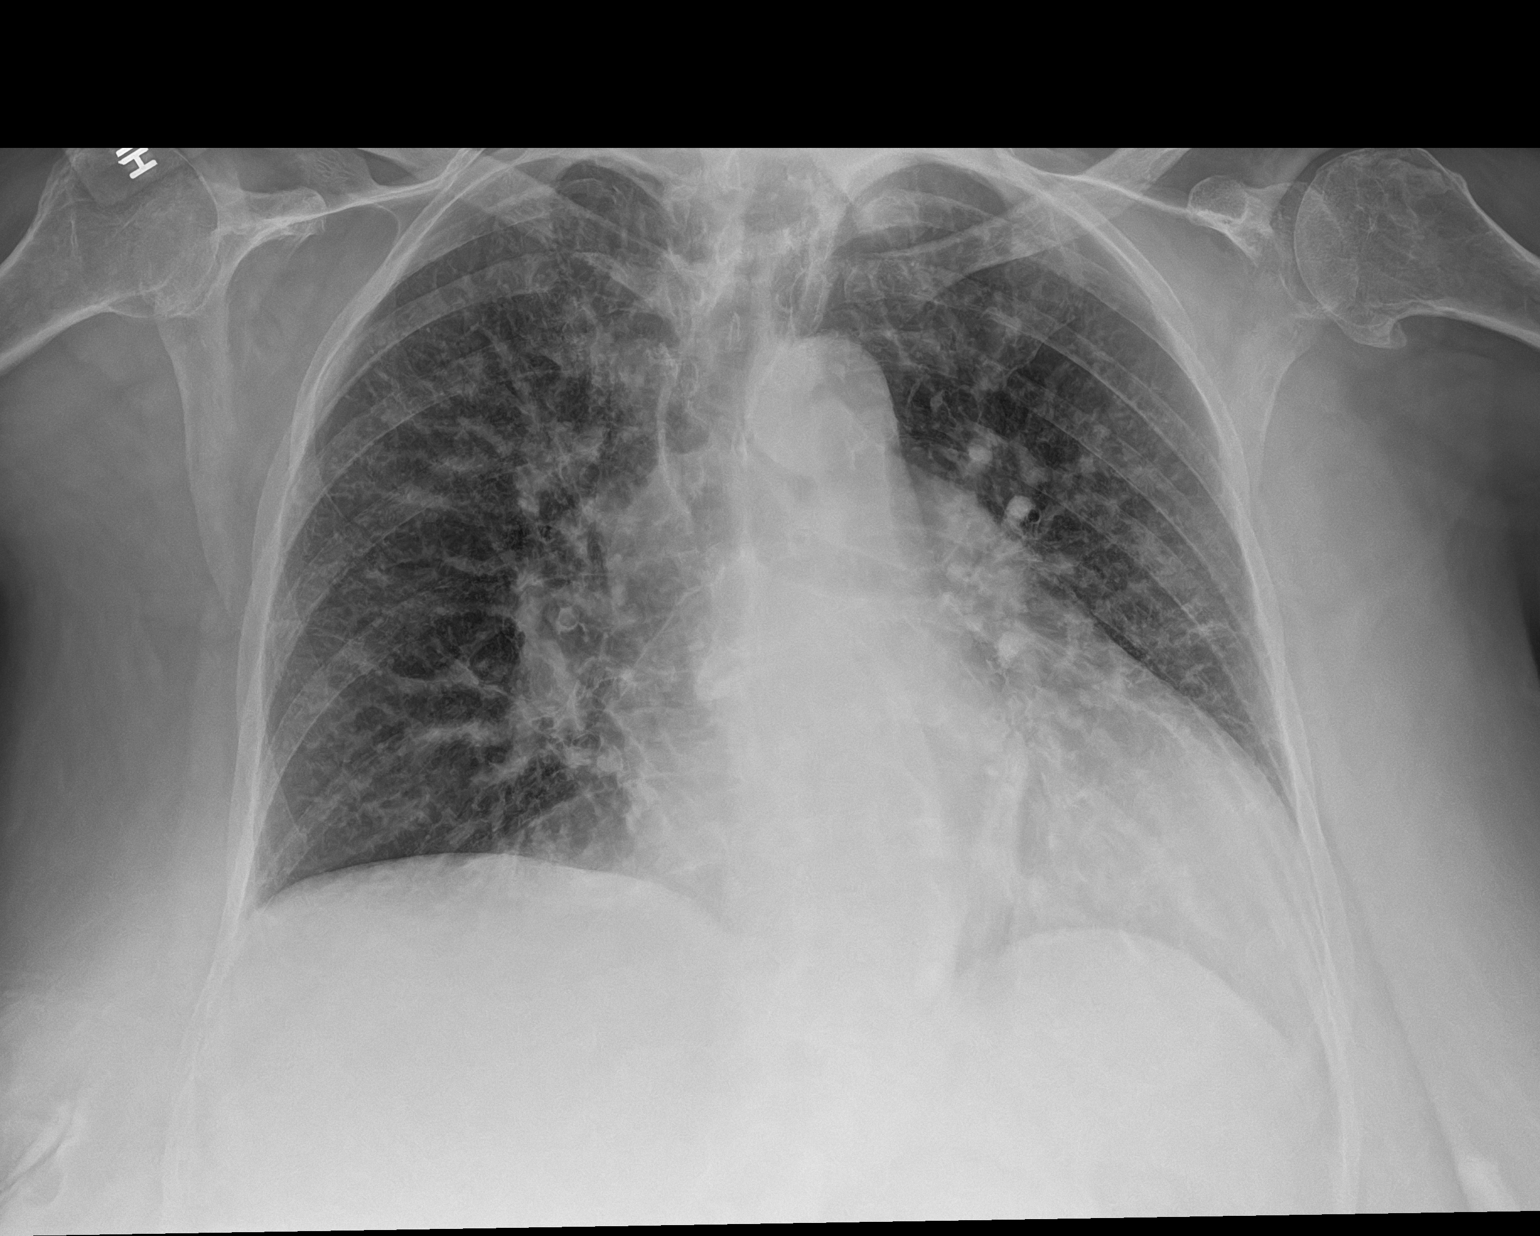

[1 of 1 positions shown; findings below may reference images not displayed]

FINDINGS: Lung volumes are small but are symmetric. Cardiomegaly is stable.
Central pulmonary vascular congestion and superimposed interstitial
pulmonary infiltrate in keeping with pulmonary edema is stable, most
in keeping with mild cardiogenic failure. No pneumothorax or pleural
effusion. Osseous structures are age-appropriate
IMPRESSION: Mild cardiogenic failure.  Stable cardiomegaly.

## 2022-10-14 ENCOUNTER — Other Ambulatory Visit: Payer: Self-pay | Admitting: Student

## 2022-10-21 ENCOUNTER — Ambulatory Visit: Payer: Medicare Other | Attending: Pulmonary Disease | Admitting: Pulmonary Disease

## 2022-10-21 DIAGNOSIS — I493 Ventricular premature depolarization: Secondary | ICD-10-CM | POA: Insufficient documentation

## 2022-10-21 DIAGNOSIS — G4733 Obstructive sleep apnea (adult) (pediatric): Secondary | ICD-10-CM | POA: Diagnosis not present

## 2022-10-27 DIAGNOSIS — G4733 Obstructive sleep apnea (adult) (pediatric): Secondary | ICD-10-CM

## 2022-10-27 NOTE — Procedures (Signed)
Patient Name: Christina Frey, [place] Date: 10/21/2022 Gender: Female D.O.B: 01/23/1941 Age (years): 43 Referring Provider: Kara Mead MD, ABSM Height (inches): 62 Interpreting Physician: Kara Mead MD, ABSM Weight (lbs): 200 RPSGT: Rosebud Poles BMI: 37 MRN: [id number] Neck Size: 16.00 <br> <br> CLINICAL INFORMATION The patient is referred for a CPAP titration to treat sleep apnea. 01/2022 HST AHI 8/hour, lowest desaturation 55%, sats stayed in the 80% range for most of the study. CPAP titration study 02/2022 7 cm, no oxygen was used ONO on CPAP/ RA showed significant desaturation for almost 4 hours less than 88%.    SLEEP STUDY TECHNIQUE As per the AASM Manual for the Scoring of Sleep and Associated Events v2.3 (April 2016) with a hypopnea requiring 4% desaturations.  The channels recorded and monitored were frontal, central and occipital EEG, electrooculogram (EOG), submentalis EMG (chin), nasal and oral airflow, thoracic and abdominal wall motion, anterior tibialis EMG, snore microphone, electrocardiogram, and pulse oximetry. Continuous positive airway pressure (CPAP) was initiated at the beginning of the study and titrated to treat sleep-disordered breathing.  MEDICATIONS Medications self-administered by patient taken the night of the study : GABAPENTIN, TYLENOL  TECHNICIAN COMMENTS Comments added by technician: Patient tolerated CPAP titration well. Titration started at 5 CWP with heated humidification. Optimal pressure obtained at 10 CWP, due to events and snoring, with an EPR of 3. Patient had trouble with oral breathing during study. Tech. added a chin strap, which seemed to help patient in keeping her mouth closed. No O2 supplement added due to the sleep labs' protocol. Again, patient had difficulties maintaining sleep, mid to latter part of study. Patient seemed to tolerate CPAP therapy better by using a nasal mask with a chin strap/ vs. a full face mask. Tech. asked patient to  keep her dentures in, during testing Comments added by scorer: N/A   RESPIRATORY PARAMETERS Optimal PAP Pressure (cm): 10 AHI at Optimal Pressure (/hr): 2.8 Overall Minimal O2 (%): 88.00 Supine % at Optimal Pressure (%): 100 Minimal O2 at Optimal Pressure (%): 88.0   SLEEP ARCHITECTURE The study was initiated at 11:06:40 PM and ended at 5:36:35 AM.  Sleep onset time was 36.3 minutes and the sleep efficiency was 44.5%. The total sleep time was 173.6 minutes.  The patient spent 1.73% of the night in stage N1 sleep, 42.12% in stage N2 sleep, 42.62% in stage N3 and 13.5% in REM.Stage REM latency was 195.0 minutes  Wake after sleep onset was 180.0. Alpha intrusion was absent. Supine sleep was 100.00%.  CARDIAC DATA The 2 lead EKG demonstrated sinus rhythm. The mean heart rate was 101.44 beats per minute. Other EKG findings include: Atrial Fibrillation, PVCs.   LEG MOVEMENT DATA The total Periodic Limb Movements of Sleep (PLMS) were 0. The PLMS index was 0.00. A PLMS index of <15 is considered normal in adults.  IMPRESSIONS - The optimal PAP pressure was 10 cm of water. - Central sleep apnea was not noted during this titration (CAI = 2.8/h). - Mild oxygen desaturations were observed during this titration (min O2 = 88.00%). NO oxygen was needed. - The patient snored with soft snoring volume during this titration study. - 2-lead EKG demonstrated: Atrial Fibrillation, PVCs - Clinically significant periodic limb movements were not noted during this study. Arousals associated with PLMs were rare.   DIAGNOSIS - Obstructive Sleep Apnea (G47.33)   RECOMMENDATIONS - Trial of CPAP therapy on 10 cm H2O with a Small size Fisher&Paykel Nasal Eson2 mask and heated humidification. - Avoid alcohol, sedatives  and other CNS depressants that may worsen sleep apnea and disrupt normal sleep architecture. - Sleep hygiene should be reviewed to assess factors that may improve sleep quality. - Weight  management and regular exercise should be initiated or continued. - Return to [facility] Center for re-evaluation after 4 weeks of therapy    Kara Mead MD Board Certified in [place]

## 2022-10-28 ENCOUNTER — Telehealth: Payer: Self-pay | Admitting: *Deleted

## 2022-10-28 NOTE — Patient Outreach (Signed)
  Care Coordination   Initial Visit Note   10/28/2022 Name: Christina Frey MRN: [id number] DOB: 1941-05-26  Christina Frey is a 82 y.o. year old female who sees Redmond School, MD for primary care. I spoke with  Christina Frey's daughter in law, Christina Frey  by phone today.  What matters to the patients health and wellness today?  Have a home visit for an assessment    Goals Addressed   None     SDOH assessments and interventions completed:  No     Care Coordination Interventions:  No, not indicated   Follow up plan:  Initial home visit scheduled for 11/05/22.    Encounter Outcome:  Pt. Visit Completed   [name] C. Myrtie Neither, MSN, [facility] Gerontological Nurse Practitioner [facility] Care Management [phone]

## 2022-11-05 ENCOUNTER — Other Ambulatory Visit: Payer: Medicare Other | Admitting: *Deleted

## 2022-11-05 ENCOUNTER — Ambulatory Visit: Payer: Self-pay | Admitting: *Deleted

## 2022-11-05 ENCOUNTER — Encounter: Payer: Self-pay | Admitting: *Deleted

## 2022-11-05 NOTE — Patient Outreach (Signed)
Care Coordination   Initial Home Visit Note   11/05/2022 Name: Christina Frey MRN: [id number] DOB: Feb 14, 1941  Christina Frey is a 82 y.o. year old female who sees Redmond School, MD for primary care. I visited  Christina Frey in their home today.  Patient Active Problem List   Diagnosis Date Noted   Nocturnal hypoxia 08/26/2022   Other secondary pulmonary hypertension ([place]) 08/26/2022   Food impaction of esophagus    Esophageal obstruction due to food impaction    Dysphagia 10/18/2021   Orthostasis 10/26/2016   UTI (urinary tract infection) 10/26/2016   Fall 10/26/2016   Postural dizziness with presyncope 10/26/2016   Dehydration [id number]   Lichen sclerosus [id number]   Morbid obesity ([place]) 07/11/2014   Abnormal EKG 07/11/2014   Chest pain at rest 07/08/2014   PUD (peptic ulcer disease)    Essential hypertension 05/02/2013   Hyperlipidemia 05/02/2013   Obstructive sleep apnea 05/02/2013   Current Outpatient Medications on File Prior to Visit  Medication Sig Dispense Refill   apixaban (ELIQUIS) 5 MG TABS tablet TAKE 1 TABLET BY MOUTH TWICE A DAY 60 tablet 5   carvedilol (COREG) 3.125 MG tablet TAKE 1 TABLET BY MOUTH 2 TIMES DAILY. 180 tablet 3   gabapentin (NEURONTIN) 300 MG capsule Take 300 mg by mouth 3 (three) times daily.     latanoprost (XALATAN) 0.005 % ophthalmic solution Place 1 drop into the left eye at bedtime.     levothyroxine (SYNTHROID) 100 MCG tablet Take 100 mcg by mouth daily before breakfast.     lisinopril (ZESTRIL) 5 MG tablet Take 5 mg by mouth daily.     oxybutynin (DITROPAN XL) 15 MG 24 hr tablet Take 15 mg by mouth at bedtime.     pantoprazole (PROTONIX) 40 MG tablet TAKE 1 TABLET BY MOUTH TWICE A DAY 180 tablet 1   potassium chloride SA (KLOR-CON M20) 20 MEQ tablet Take 1 tablet (20 mEq total) by mouth daily. 90 tablet 3   torsemide (DEMADEX) 20 MG tablet TAKE 1 TABLET BY MOUTH TWICE A DAY 7 tablet 0   mirtazapine (REMERON) 15 MG tablet Take 15  mg by mouth at bedtime. (Patient not taking: Reported on 11/05/2022)     traMADol (ULTRAM) 50 MG tablet Take 50 mg by mouth every 6 (six) hours as needed for moderate pain.     No current facility-administered medications on file prior to visit.   What matters to the patients health and wellness today?  Take a more active role in keeping myself healthy and prevent complications.    PT OUT OF TORSEMIDE, WT UP TO 180, 2-3+ EDEMA, LUNGS CLEAR.  BP 130/80 (BP Location: Left Arm, Cuff Size: Normal)   Pulse 70   Resp 18   Wt 180 lb (81.6 kg)   SpO2 93%   BMI 32.92 kg/m  Neuro: Alert, oriented and appropriate              Poor to absent sensation in bilateral feet Heart: AFIB with murmur Lungs: clear Extremities: 3+ edema R foot, color rubra, cool to touch, unable to palpate pedal or post popliteal pulses. 2+ edema L foot, pink, cool to touch, 2+ pulses. No open lesions or deformities.     11/05/2022   11:11 AM  Depression screen PHQ 2/9  Decreased Interest 1  Down, Depressed, Hopeless 1  PHQ - 2 Score 2  Altered sleeping 3  Tired, decreased energy 3  Change in appetite 2  Feeling  bad or failure about yourself  0  Trouble concentrating 2  Moving slowly or fidgety/restless 0  Suicidal thoughts 0  PHQ-9 Score 12  Difficult doing work/chores Not difficult at all   HAD MIRTAZAPINE BUT NOT TAKING IT, IF NO UNTOWARD EFFECTS WOULD DEFINITELY ENCOURAGE TO RESUME. SHE HAS ABOUT 3 MONTHS WORTH!  WILL INQUIRE TO SEE IF STATIONARY PEDDLE EXERCISER COULD BENEFIT PT.   Goals Addressed             This Visit's Progress    I will eat 3 small nutritious meals a day over the next 90 days.       Interventions Today    Flowsheet Row Most Recent Value  Chronic Disease   Chronic disease during today's visit Other, Congestive Heart Failure (CHF), Hypertension (HTN), Atrial Fibrillation (AFib)  [Pt needs AWV, labs, med renewalss. Eye exam ([place]).]  General Interventions   General  Interventions Discussed/Reviewed General Interventions Discussed, Annual Eye Exam, Labs, Annual Foot Exam, Lipid Profile, Vaccines, Health Screening, [employer], Doctor Visits  [Pt is reluctant to go to MD visits. Educated that doing so prevents complications. Needs to see MD q 3-4 months for chronic disease mgmt, med mgmt, labs.]  Labs Hgb A1c annually, Kidney Function  [Previously may have been borderline diabetic. Does not monitor glucose. Does not take diabetic meds. Today's FBS was 113.]  Doctor Visits Discussed/Reviewed Doctor Visits Discussed, Annual Wellness Visits, PCP, Specialist  Exercise Interventions   Exercise Discussed/Reviewed Exercise Reviewed, Physical Activity  [Pt is sedentary. She walks from BR to bathroom to her chair. Rarely leaves the house. Severe peripheral neuropathy and pain.]  Physical Activity Discussed/Reviewed Physical Activity Discussed  Education Interventions   Education Provided Provided Education  Provided Verbal Education On When to see the doctor, Community Resources  Nutrition Interventions   Nutrition Discussed/Reviewed Nutrition Discussed, Nutrition Reviewed, Adding fruits and vegetables, Fluid intake, Decreasing sugar intake, Increaing proteins, Decreasing salt  [Very poor nutrition by pt choice. Educated regarding nutrition, balanced diet, decreasing junk foods, eliminating salty snacks.]  Advanced Directive Interventions   Advanced Directives Discussed/Reviewed Advanced Directives Discussed, Provided resource for acquiring and filling out documents  [Gave documents.]           I will make a PCP and opthalmology appt and be seen ASAP.       Interventions Today    Flowsheet Row Most Recent Value  Chronic Disease   Chronic disease during today's visit Other, Congestive Heart Failure (CHF), Hypertension (HTN), Atrial Fibrillation (AFib)  [Pt needs AWV, labs, med renewalss. Eye exam ([place]).]  General Interventions   General Interventions  Discussed/Reviewed General Interventions Discussed, Annual Eye Exam, Labs, Annual Foot Exam, Lipid Profile, Vaccines, Health Screening, [employer], Doctor Visits  [Pt is reluctant to go to MD visits. Educated that doing so prevents complications. Needs to see MD q 3-4 months for chronic disease mgmt, med mgmt, labs.]  Labs Hgb A1c annually, Kidney Function  [Previously may have been borderline diabetic. Does not monitor glucose. Does not take diabetic meds. Today's FBS was 113.]  Doctor Visits Discussed/Reviewed Doctor Visits Discussed, Annual Wellness Visits, PCP, Specialist  Exercise Interventions   Exercise Discussed/Reviewed Exercise Reviewed, Physical Activity  [Pt is sedentary. She walks from BR to bathroom to her chair. Rarely leaves the house. Severe peripheral neuropathy and pain.]  Physical Activity Discussed/Reviewed Physical Activity Discussed  Education Interventions   Education Provided Provided Education  Provided Verbal Education On When to see the doctor, Museum/gallery conservator  Interventions   Advanced Directives Discussed/Reviewed Advanced Directives Discussed, Provided resource for acquiring and filling out documents  [Gave documents.]           I will weigh every day over the next 90 days.       Interventions Today    Flowsheet Row Most Recent Value  Chronic Disease   Chronic disease during today's visit Congestive Heart Failure (CHF)  General Interventions   General Interventions Discussed/Reviewed General Interventions Discussed  [HF Action Plan introduced. Currently pt weighs occasionally and she does not write this down. Request that pt weighs daily and record. Report wt gain 3# overnight, 5# over 3 days for medication adjustment to avoid complications.]  Exercise Interventions   Exercise Discussed/Reviewed Exercise Reviewed, Physical Activity  [Pt is sedentary. She walks from BR to bathroom to her chair. Rarely leaves the house. Severe  peripheral neuropathy and pain.]  Physical Activity Discussed/Reviewed Physical Activity Discussed  Education Interventions   Education Provided Provided Printed Education, Provided Education              SDOH assessments and interventions completed:  Yes  SDOH Interventions Today    Flowsheet Row Most Recent Value  SDOH Interventions   Food Insecurity Interventions Intervention Not Indicated  Housing Interventions Intervention Not Indicated  Transportation Interventions Intervention Not Indicated  Utilities Interventions Intervention Not Indicated  Alcohol Usage Interventions Intervention Not Indicated (Score <7)  Depression Interventions/Treatment  Medication, Counseling  Financial Strain Interventions Intervention Not Indicated  Physical Activity Interventions Other (Comments)  [Counseled pt and son to increase in home daily walking.]  Stress Interventions Intervention Not Indicated  Social Connections Interventions Intervention Not Indicated        Care Coordination Interventions:  Yes, provided   Follow up plan: Follow up call scheduled for 1 month.    Encounter Outcome:  Pt. Visit Completed   [name] C. Myrtie Neither, MSN, [facility] Gerontological Nurse Practitioner [facility] Care Management [phone]

## 2022-11-26 DIAGNOSIS — Z6833 Body mass index (BMI) 33.0-33.9, adult: Secondary | ICD-10-CM | POA: Diagnosis not present

## 2022-11-26 DIAGNOSIS — N390 Urinary tract infection, site not specified: Secondary | ICD-10-CM | POA: Diagnosis not present

## 2022-11-26 DIAGNOSIS — I10 Essential (primary) hypertension: Secondary | ICD-10-CM | POA: Diagnosis not present

## 2022-11-26 DIAGNOSIS — E1129 Type 2 diabetes mellitus with other diabetic kidney complication: Secondary | ICD-10-CM | POA: Diagnosis not present

## 2022-11-26 DIAGNOSIS — E559 Vitamin D deficiency, unspecified: Secondary | ICD-10-CM | POA: Diagnosis not present

## 2022-11-26 DIAGNOSIS — I5033 Acute on chronic diastolic (congestive) heart failure: Secondary | ICD-10-CM | POA: Diagnosis not present

## 2022-11-26 DIAGNOSIS — I4891 Unspecified atrial fibrillation: Secondary | ICD-10-CM | POA: Diagnosis not present

## 2022-11-26 DIAGNOSIS — B3731 Acute candidiasis of vulva and vagina: Secondary | ICD-10-CM | POA: Diagnosis not present

## 2022-12-03 ENCOUNTER — Ambulatory Visit: Payer: Self-pay | Admitting: *Deleted

## 2022-12-03 NOTE — Patient Outreach (Signed)
  Care Coordination   Home Visit Note   12/03/2022 Name: Christina Frey MRN: [id number] DOB: 1941/05/25  Christina Frey is a 82 y.o. year old female who sees Redmond School, MD for primary care. I visited  Christina Frey in their home today.  What matters to the patients health and wellness today?  Keep myself out of the hospital.    Goals Addressed             This Visit's Progress    I will eat 3 small nutritious meals a day over the next 90 days.       Pt reports nutrition has improved somewhat. We discussed the sodium content in some of  of the canned foods she has been eating which are of high sodium content. Discussed serving sizes and looking at food labels.       I will make a PCP and opthalmology appt and be seen ASAP.       Has not made an appt yet. Encouaged pt son to do so.       I will weigh every day over the next 90 days.       Pt weight is 171. She is weighing most days and is conscientious of her breathing status and any peripheral edema.          SDOH assessments and interventions completed:  Yes   Previously addressed.  Care Coordination Interventions:  Yes, provided   Follow up plan: Follow up call scheduled for 1 month.    Encounter Outcome:  Pt. Visit Completed   [name] C. Myrtie Neither, MSN, [facility] Gerontological Nurse Practitioner [facility] Care Management [phone]

## 2022-12-18 ENCOUNTER — Other Ambulatory Visit: Payer: Self-pay | Admitting: Gastroenterology

## 2022-12-22 ENCOUNTER — Telehealth: Payer: Self-pay | Admitting: *Deleted

## 2022-12-22 NOTE — Progress Notes (Signed)
  Care Coordination Note  12/22/2022 Name: [name] MRN: [id number] DOB: 1941/05/28  Christina Frey is a 82 y.o. year old female who is a primary care patient of Elfredia Nevins, MD and is actively engaged with the care management team. I reached out to Hyacinth Meeker by phone today to assist with re-scheduling a follow up visit with the RN Case Manager with [employer].   Follow up plan: Unsuccessful telephone outreach attempt made. A HIPAA compliant phone message was left for the patient providing contact information and requesting a return call.   [facility]  Care Coordination Care Guide  Direct Dial: [phone]

## 2022-12-26 ENCOUNTER — Telehealth: Payer: Self-pay | Admitting: Cardiology

## 2022-12-26 MED ORDER — APIXABAN 5 MG PO TABS: 5.0000 mg | ORAL_TABLET | Freq: Two times a day (BID) | ORAL | 0 refills | Status: AC

## 2022-12-26 NOTE — Telephone Encounter (Signed)
Called daughter to inform her samples for patient were available at the [place] office. Lefta a message for pt's daughter to call back.

## 2022-12-26 NOTE — Telephone Encounter (Signed)
Pt c/o medication issue:  1. Name of Medication: apixaban (ELIQUIS) 5 MG TABS tablet   2. How are you currently taking this medication (dosage and times per day)?  TAKE 1 TABLET BY MOUTH TWICE A DAY    3. Are you having a reaction (difficulty breathing--STAT)? No  4. What is your medication issue? Patient's daughter in law is calling stating that the patient has been out of medication for almost a week. Patient's daughter in law is requesting for samples of the medication. Patient's daughter in law stated that their is a gap in the patient's insurance and the patient will not have insurance until May. Please advise.

## 2022-12-26 NOTE — Telephone Encounter (Signed)
Pt last saw Randall An, [place] on 11/22/21, pt is overdue for follow-up. Pt was supposed to follow-up in 6 months, recall in Epic.  Msg sent to schedulers to contact pt for follow-up appt.  Last labs 03/17/22 Creat 1.12, age 82, weight 77.6kg, based on specified criteria pt is on appropriate dosage of Eliquis  BID for afib.   Pt's daughter is requesting samples, verified dosage will forward message to [place] office to check to see if samples available.

## 2022-12-26 NOTE — Progress Notes (Signed)
  [employer] Care Coordination  Note  12/26/2022 Name: Christina Frey MRN: [id number] DOB: 11/25/1940  [name] is a 82 y.o. year old female who is a primary care patient of Elfredia Nevins, MD and is actively engaged with the care management team. I reached out to Hyacinth Meeker by phone today to assist with re-scheduling a follow up visit with the RN Case Manager  Follow up plan: Patient and family declines further follow up and engagement by the care management team for [employer]. Appropriate care team members and provider have been notified via electronic communication.   [facility]  Care Coordination Care Guide  Direct Dial: [phone]

## 2023-01-02 ENCOUNTER — Encounter: Payer: Medicare Other | Admitting: *Deleted

## 2023-02-18 ENCOUNTER — Telehealth: Payer: Self-pay | Admitting: Pulmonary Disease

## 2023-02-18 NOTE — Telephone Encounter (Signed)
Pt calling in for elliquis refill   [facility] [address]

## 2023-02-19 ENCOUNTER — Other Ambulatory Visit: Payer: Self-pay | Admitting: Cardiology

## 2023-02-19 DIAGNOSIS — I4819 Other persistent atrial fibrillation: Secondary | ICD-10-CM

## 2023-02-20 NOTE — Telephone Encounter (Signed)
ATC X1 LVM for patient. If needing eliquis refill she will need to contact Dr. Ival Bible office her Cardiologist. He prescribed the medication for her

## 2023-02-20 NOTE — Telephone Encounter (Signed)
Prescription refill request for Eliquis received. Indication: Afib  Last office visit: 11/22/21 [place] Ouch)  Scr: 1.12 (03/17/22)  Age: 82 Weight: 77.6kg   Appropriate dose. Refill sent.

## 2023-02-24 NOTE — Telephone Encounter (Signed)
Called the pt and there was no answer-left her a detailed msg on machine ok per DPR to call cards for refill

## 2023-02-24 NOTE — Telephone Encounter (Signed)
Atc pt no answer, lvm for pt.

## 2023-03-03 DIAGNOSIS — G894 Chronic pain syndrome: Secondary | ICD-10-CM | POA: Diagnosis not present

## 2023-03-03 DIAGNOSIS — N390 Urinary tract infection, site not specified: Secondary | ICD-10-CM | POA: Diagnosis not present

## 2023-03-03 DIAGNOSIS — M545 Low back pain, unspecified: Secondary | ICD-10-CM | POA: Diagnosis not present

## 2023-03-18 NOTE — Progress Notes (Unsigned)
Cardiology Office Note    Date:  03/19/2023  ID:  Christina Frey, DOB Nov 03, 1940, MRN [id number] Cardiologist: Nona Dell, MD    History of Present Illness:    Christina Frey is a 82 y.o. female with past medical history of permanent atrial fibrillation, HFpEF, ASD, pulmonary HTN, HTN, Hypothyroidism and PUD who presents to the office today for overdue follow-up.  She was last examined by myself in 11/2021 and reported still having some lower extremity edema but this had improved since being switched to Torsemide. She was continued on Torsemide 20 mg twice daily and was encouraged to review medications with her PCP as she had gained 20 pounds since being started on Remeron. No changes were made to her cardiac medications at that time.  In talking with the patient and two of her family members today, she reports her activity is limited and she is mostly in a lift chair throughout the day. She does not leave the home routinely except for medical appointments. She denies any recent chest pain or palpitations. No recent orthopnea or PND. Uses her CPAP on a nightly basis. She does experience intermittent lower extremity edema and takes Torsemide 20 mg twice daily and does take an extra tablet if needed. Family members report her appetite is minimal at times and she mostly consumes fruit snacks.  Studies Reviewed:   EKG: EKG is ordered today and demonstrates:   EKG Interpretation Date/Time:  Thursday March 19 2023 13:24:17 EDT Ventricular Rate:  49 PR Interval:    QRS Duration:  92 QT Interval:  470 QTC Calculation: 424 R Axis:   123  Text Interpretation: Atrial fibrillation with slow ventricular response Incomplete right bundle branch block Confirmed by Randall An ([id number]) on 03/19/2023 2:04:40 PM        Echocardiogram: 06/2021 IMPRESSIONS     1. Left ventricular ejection fraction, by estimation, is 65 to 70%. The  left ventricle has normal function. The left ventricle has  no regional  wall motion abnormalities. Left ventricular diastolic parameters are  indeterminate. There is the  interventricular septum is flattened in systole and diastole, consistent  with right ventricular pressure and volume overload.   2. Right ventricular systolic function is mildly reduced. The right  ventricular size is moderately enlarged. There is moderately elevated  pulmonary artery systolic pressure. The estimated right ventricular  systolic pressure is 49.7 mmHg.   3. Left atrial size was severely dilated.   4. Right atrial size was severely dilated.   5. A small pericardial effusion is present. The pericardial effusion is  posterior to the left ventricle.   6. The mitral valve is abnormal. Mild mitral valve regurgitation.  Moderate mitral annular calcification.   7. Tricuspid valve regurgitation is mild to moderate.   8. The aortic valve is tricuspid. Aortic valve regurgitation is not  visualized. Mild to moderate aortic valve sclerosis/calcification is  present, without any evidence of aortic stenosis. Aortic valve mean  gradient measures 3.0 mmHg.   9. The inferior vena cava is dilated in size with >50% respiratory  variability, suggesting right atrial pressure of 8 mmHg.  10. Evidence of atrial level shunting detected by color flow Doppler.  There is a moderately sized secundum atrial septal defect with  predominantly left to right shunting across the atrial septum.   Comparison(s): Prior images unable to be directly viewed.   Event Monitor: 06/2021 ZIO XT reviewed. 2 days, 12 hours analyzed. Atrial fibrillation was present throughout with heart rate  ranging from 50 bpm up to 106 bpm and average heart rate 74 bpm. There were occasional PVCs representing 2% total beats with otherwise rare ventricular couplets and limited ventricular bigeminy. No pauses were noted.   Risk Assessment/Calculations:    CHA2DS2-VASc Score = 5   This indicates a 7.2% annual risk of  stroke. The patient's score is based upon: CHF History: 0 HTN History: 1 Diabetes History: 1 Stroke History: 0 Vascular Disease History: 0 Age Score: 2 Gender Score: 1     Physical Exam:   VS:  BP (!) 90/52   Pulse (!) 58   Ht 5\' 2"  (1.575 m)   Wt 176 lb 3.2 oz (79.9 kg)   SpO2 98%   BMI 32.23 kg/m    Wt Readings from Last 3 Encounters:  03/19/23 176 lb 3.2 oz (79.9 kg)  12/03/22 171 lb (77.6 kg)  11/05/22 180 lb (81.6 kg)     GEN: Pleasant, elderly female appearing in no acute distress NECK: No JVD; No carotid bruits CARDIAC: Irregularly irregular, 2/6 SEM along RUSB.  RESPIRATORY:  Clear to auscultation without rales, wheezing or rhonchi  ABDOMEN: Appears non-distended. No obvious abdominal masses. EXTREMITIES: No clubbing or cyanosis. 1+ pitting edema up to mid-shins bilaterally.  Distal pedal pulses are 2+ bilaterally.   Assessment and Plan:   1. HFpEF - Echocardiogram in 06/2021 showed a preserved EF of 65 to 70% but she did have mildly reduced RV function and moderately elevated PASP.  She does have 1+ pitting edema on examination today but her weight has been declining at home given her decreased PO intake. Will check labs today but will continue Torsemide 20 mg twice daily for now. Would not use an SGLT2 inhibitor given her variable PO intake.   2. RV Dysfunction/Pulmonary HTN/ASD - Noted on prior echocardiogram in 2022 as discussed above. Conservative management was recommended given her age. She also established with Pulmonology in the interim and is now using a CPAP. Continue diuretic therapy as outlined above.   3. Permanent Atrial Fibrillation/Use of Long-term Anticoagulation - She denies any recent palpitations and her heart rate is in the 50's during today's visit. She remains on Coreg 3.125 mg twice daily but may need to discontinue as discussed below given her hypotension. - No reports of active bleeding. She remains on Eliquis 5 mg twice daily for  anticoagulation. Will recheck a CBC and BMET. Creatinine was at 1.2 in 11/2022.  4. HTN - Her blood pressure is soft at 90/52 during today's visit with similar values by recheck. She is currently taking Coreg 3.125 mg twice daily and Lisinopril 5 mg daily. I did recommend stopping Lisinopril and they were provided with a BP log with instructions to return this in several weeks. She may ultimately require discontinuation of Coreg pending her BP trend.  Signed, Ellsworth Lennox, PA-C

## 2023-03-19 ENCOUNTER — Other Ambulatory Visit (HOSPITAL_COMMUNITY)
Admission: RE | Admit: 2023-03-19 | Discharge: 2023-03-19 | Disposition: A | Discharge status: Home / Self Care | Payer: Medicare Other | Source: Ambulatory Visit | Attending: Student | Admitting: Student

## 2023-03-19 ENCOUNTER — Encounter: Payer: Self-pay | Admitting: Student

## 2023-03-19 ENCOUNTER — Ambulatory Visit: Payer: Medicare Other | Attending: Student | Admitting: Student

## 2023-03-19 VITALS — BP 90/52 | HR 58 | Ht 62.0 in | Wt 176.2 lb

## 2023-03-19 DIAGNOSIS — Z79899 Other long term (current) drug therapy: Secondary | ICD-10-CM | POA: Diagnosis not present

## 2023-03-19 DIAGNOSIS — I5032 Chronic diastolic (congestive) heart failure: Secondary | ICD-10-CM | POA: Diagnosis not present

## 2023-03-19 DIAGNOSIS — I4821 Permanent atrial fibrillation: Secondary | ICD-10-CM | POA: Diagnosis not present

## 2023-03-19 DIAGNOSIS — I272 Pulmonary hypertension, unspecified: Secondary | ICD-10-CM | POA: Diagnosis not present

## 2023-03-19 DIAGNOSIS — I10 Essential (primary) hypertension: Secondary | ICD-10-CM | POA: Insufficient documentation

## 2023-03-19 LAB — BASIC METABOLIC PANEL
Anion gap: 8 (ref 5–15)
BUN: 27 mg/dL — ABNORMAL HIGH (ref 8–23)
CO2: 29 mmol/L (ref 22–32)
Calcium: 9.4 mg/dL (ref 8.9–10.3)
Chloride: 99 mmol/L (ref 98–111)
Creatinine, Ser: 1.47 mg/dL — ABNORMAL HIGH (ref 0.44–1.00)
GFR, Estimated: 35 mL/min — ABNORMAL LOW (ref >60)
Glucose, Bld: 97 mg/dL (ref 70–99)
Potassium: 4 mmol/L (ref 3.5–5.1)
Sodium: 136 mmol/L (ref 135–145)

## 2023-03-19 LAB — CBC
HCT: 36.3 % (ref 36.0–46.0)
Hemoglobin: 11.4 g/dL — ABNORMAL LOW (ref 12.0–15.0)
MCH: 30.5 pg (ref 26.0–34.0)
MCHC: 31.4 g/dL (ref 30.0–36.0)
MCV: 97.1 fL (ref 80.0–100.0)
Platelets: 143 10*3/uL — ABNORMAL LOW (ref 150–400)
RBC: 3.74 MIL/uL — ABNORMAL LOW (ref 3.87–5.11)
RDW: 14.4 % (ref 11.5–15.5)
WBC: 5.5 10*3/uL (ref 4.0–10.5)
nRBC: 0 % (ref 0.0–0.2)

## 2023-03-19 NOTE — Patient Instructions (Signed)
Medication Instructions:   Stop Lisinopril. Return BP/HR log in 3-4 weeks.   *If you need a refill on your cardiac medications before your next appointment, please call your pharmacy*   Lab Work:  CBC and BMET today.   If you have labs (blood work) drawn today and your tests are completely normal, you will receive your results only by: MyChart Message (if you have MyChart) OR A paper copy in the mail If you have any lab test that is abnormal or we need to change your treatment, we will call you to review the results.  Follow-Up: At [facility], you and your health needs are our priority.  As part of our continuing mission to provide you with exceptional heart care, we have created designated Provider Care Teams.  These Care Teams include your primary Cardiologist (physician) and Advanced Practice Providers (APPs -  Physician Assistants and Nurse Practitioners) who all work together to provide you with the care you need, when you need it.  We recommend signing up for the patient portal called "MyChart".  Sign up information is provided on this After Visit Summary.  MyChart is used to connect with patients for Virtual Visits (Telemedicine).  Patients are able to view lab/test results, encounter notes, upcoming appointments, etc.  Non-urgent messages can be sent to your provider as well.   To learn more about what you can do with MyChart, go to [email or web address].    Your next appointment:   6 month(s)  Provider:   You may see Nona Dell, MD or one of the following Advanced Practice Providers on your designated Care Team:   [place], PA-C  Jacolyn Reedy, [place]

## 2023-03-27 ENCOUNTER — Telehealth: Payer: Self-pay

## 2023-03-27 DIAGNOSIS — Z79899 Other long term (current) drug therapy: Secondary | ICD-10-CM

## 2023-03-27 NOTE — Telephone Encounter (Signed)
-----   Message from Ellsworth Lennox sent at 03/19/2023  4:33 PM EDT ----- Please let the patient and her family know that her hemoglobin has improved as this was previously at 10.8 last year and is now at 11.4. Platelet count slightly low at 143 but no significant abnormalities. Sodium and potassium are within a normal range. Her creatinine has increased from 1.2 to 1.47 which would suggest some dehydration. Given her edema on examination, would continue on Torsemide 20 mg twice daily but would not take an extra tablet as she has been doing intermittently. Continue to avoid NSAIDS and would encourage her to consume approximately 2 liters of fluid daily (preferably water). Would recheck a BMET in 3-4 weeks to make sure this has stabilized. If not, we may need to reduce her Torsemide dose.

## 2023-03-27 NOTE — Telephone Encounter (Signed)
I spoke with daughter in law and discussed lab results. She will encourage Christina Frey to drink 2 liters of water a day, avoid NSAIDS and only take Torsemide 20 mg bid.  They will repeat bmet on 04/17/23 at [facility]

## 2023-04-20 ENCOUNTER — Other Ambulatory Visit (HOSPITAL_COMMUNITY)
Admission: RE | Admit: 2023-04-20 | Discharge: 2023-04-20 | Disposition: A | Discharge status: Home / Self Care | Payer: Medicare Other | Source: Ambulatory Visit | Attending: Student | Admitting: Student

## 2023-04-20 DIAGNOSIS — Z79899 Other long term (current) drug therapy: Secondary | ICD-10-CM | POA: Diagnosis not present

## 2023-04-20 LAB — BASIC METABOLIC PANEL
Anion gap: 10 (ref 5–15)
BUN: 20 mg/dL (ref 8–23)
CO2: 27 mmol/L (ref 22–32)
Calcium: 9.3 mg/dL (ref 8.9–10.3)
Chloride: 98 mmol/L (ref 98–111)
Creatinine, Ser: 1.25 mg/dL — ABNORMAL HIGH (ref 0.44–1.00)
GFR, Estimated: 43 mL/min — ABNORMAL LOW (ref >60)
Glucose, Bld: 96 mg/dL (ref 70–99)
Potassium: 4.5 mmol/L (ref 3.5–5.1)
Sodium: 135 mmol/L (ref 135–145)

## 2023-04-23 ENCOUNTER — Telehealth: Payer: Self-pay | Admitting: Cardiology

## 2023-04-23 NOTE — Telephone Encounter (Signed)
Daughter returned staff call regarding patient's results.

## 2023-04-23 NOTE — Telephone Encounter (Signed)
-----   Message from Ellsworth Lennox sent at 04/20/2023  6:43 PM EDT ----- Please let the patient know her kidney function has improved as creatinine was previously at 1.47 and is now down to 1.25. Electrolytes within a normal range. Continue current medications and try to consume 2 L of fluids daily (mainly water) as previously discussed.

## 2023-04-23 NOTE — Telephone Encounter (Signed)
Pts daughter notified and verbalized understanding. Pts daughter had no questions or concerns at this time. PCP copied.

## 2023-05-07 ENCOUNTER — Other Ambulatory Visit: Payer: Self-pay | Admitting: Cardiology

## 2023-06-25 ENCOUNTER — Other Ambulatory Visit: Payer: Self-pay

## 2023-06-25 DIAGNOSIS — R1319 Other dysphagia: Secondary | ICD-10-CM

## 2023-06-25 DIAGNOSIS — W44F3XA Food entering into or through a natural orifice, initial encounter: Secondary | ICD-10-CM

## 2023-06-25 DIAGNOSIS — T18128D Food in esophagus causing other injury, subsequent encounter: Secondary | ICD-10-CM

## 2023-06-25 MED ORDER — PANTOPRAZOLE SODIUM 40 MG PO TBEC: 40.0000 mg | DELAYED_RELEASE_TABLET | Freq: Two times a day (BID) | ORAL | 0 refills | Status: DC

## 2023-09-19 ENCOUNTER — Other Ambulatory Visit: Payer: Self-pay | Admitting: Gastroenterology

## 2023-09-19 DIAGNOSIS — R1319 Other dysphagia: Secondary | ICD-10-CM

## 2023-09-19 DIAGNOSIS — T18128A Food in esophagus causing other injury, initial encounter: Secondary | ICD-10-CM

## 2023-09-19 DIAGNOSIS — W44F3XD Food entering into or through a natural orifice, subsequent encounter: Secondary | ICD-10-CM

## 2023-09-21 ENCOUNTER — Other Ambulatory Visit: Payer: Self-pay | Admitting: Cardiology

## 2023-09-21 DIAGNOSIS — I4819 Other persistent atrial fibrillation: Secondary | ICD-10-CM

## 2023-09-21 NOTE — Telephone Encounter (Signed)
 Prescription refill request for Eliquis received. Indication: AF Last office visit: 03/19/23  B Strader PA-C Scr: 1.25 on 04/20/23 Age: 83 Weight: 79.9kg  Based on above findings Eliquis 5mg  twice daily is the appropriate dose.  Refill approved.

## 2023-09-29 NOTE — Telephone Encounter (Signed)
Please arrange office visit for refills. Thanks!

## 2023-10-07 ENCOUNTER — Telehealth: Payer: Self-pay | Admitting: Gastroenterology

## 2023-10-07 NOTE — Telephone Encounter (Signed)
Needing refills and left pt a message to call the office to schedule an appt.

## 2023-10-23 ENCOUNTER — Other Ambulatory Visit: Payer: Self-pay | Admitting: Gastroenterology

## 2023-10-23 DIAGNOSIS — T18128A Food in esophagus causing other injury, initial encounter: Secondary | ICD-10-CM

## 2023-10-23 DIAGNOSIS — T18128D Food in esophagus causing other injury, subsequent encounter: Secondary | ICD-10-CM

## 2023-10-23 DIAGNOSIS — R1319 Other dysphagia: Secondary | ICD-10-CM

## 2023-10-27 ENCOUNTER — Other Ambulatory Visit: Payer: Self-pay | Admitting: Gastroenterology

## 2023-10-27 DIAGNOSIS — W44F3XD Food entering into or through a natural orifice, subsequent encounter: Secondary | ICD-10-CM

## 2023-10-27 DIAGNOSIS — T18128A Food in esophagus causing other injury, initial encounter: Secondary | ICD-10-CM

## 2023-10-27 DIAGNOSIS — R1319 Other dysphagia: Secondary | ICD-10-CM

## 2024-01-12 DIAGNOSIS — Z6832 Body mass index (BMI) 32.0-32.9, adult: Secondary | ICD-10-CM | POA: Diagnosis not present

## 2024-01-12 DIAGNOSIS — I4891 Unspecified atrial fibrillation: Secondary | ICD-10-CM | POA: Diagnosis not present

## 2024-01-12 DIAGNOSIS — I110 Hypertensive heart disease with heart failure: Secondary | ICD-10-CM | POA: Diagnosis not present

## 2024-01-12 DIAGNOSIS — G894 Chronic pain syndrome: Secondary | ICD-10-CM | POA: Diagnosis not present

## 2024-01-12 DIAGNOSIS — E039 Hypothyroidism, unspecified: Secondary | ICD-10-CM | POA: Diagnosis not present

## 2024-01-12 DIAGNOSIS — I5030 Unspecified diastolic (congestive) heart failure: Secondary | ICD-10-CM | POA: Diagnosis not present

## 2024-01-12 DIAGNOSIS — I10 Essential (primary) hypertension: Secondary | ICD-10-CM | POA: Diagnosis not present

## 2024-01-12 DIAGNOSIS — G4730 Sleep apnea, unspecified: Secondary | ICD-10-CM | POA: Diagnosis not present

## 2024-01-12 DIAGNOSIS — E6609 Other obesity due to excess calories: Secondary | ICD-10-CM | POA: Diagnosis not present

## 2024-01-12 DIAGNOSIS — F424 Excoriation (skin-picking) disorder: Secondary | ICD-10-CM | POA: Diagnosis not present

## 2024-05-03 DIAGNOSIS — E039 Hypothyroidism, unspecified: Secondary | ICD-10-CM | POA: Diagnosis not present

## 2024-05-03 DIAGNOSIS — I119 Hypertensive heart disease without heart failure: Secondary | ICD-10-CM | POA: Diagnosis not present

## 2024-05-03 DIAGNOSIS — Z6827 Body mass index (BMI) 27.0-27.9, adult: Secondary | ICD-10-CM | POA: Diagnosis not present

## 2024-05-03 DIAGNOSIS — F32A Depression, unspecified: Secondary | ICD-10-CM | POA: Diagnosis not present

## 2024-05-03 DIAGNOSIS — F419 Anxiety disorder, unspecified: Secondary | ICD-10-CM | POA: Diagnosis not present

## 2024-05-03 DIAGNOSIS — Z7689 Persons encountering health services in other specified circumstances: Secondary | ICD-10-CM | POA: Diagnosis not present

## 2024-05-03 DIAGNOSIS — I10 Essential (primary) hypertension: Secondary | ICD-10-CM | POA: Diagnosis not present

## 2024-05-03 DIAGNOSIS — Z79899 Other long term (current) drug therapy: Secondary | ICD-10-CM | POA: Diagnosis not present

## 2024-05-03 DIAGNOSIS — G629 Polyneuropathy, unspecified: Secondary | ICD-10-CM | POA: Diagnosis not present

## 2024-05-03 DIAGNOSIS — Z79890 Hormone replacement therapy (postmenopausal): Secondary | ICD-10-CM | POA: Diagnosis not present

## 2024-05-11 DIAGNOSIS — R7301 Impaired fasting glucose: Secondary | ICD-10-CM | POA: Diagnosis not present

## 2024-05-11 DIAGNOSIS — I10 Essential (primary) hypertension: Secondary | ICD-10-CM | POA: Diagnosis not present

## 2024-05-11 DIAGNOSIS — Z6827 Body mass index (BMI) 27.0-27.9, adult: Secondary | ICD-10-CM | POA: Diagnosis not present

## 2024-05-11 DIAGNOSIS — E039 Hypothyroidism, unspecified: Secondary | ICD-10-CM | POA: Diagnosis not present

## 2024-05-18 DIAGNOSIS — E039 Hypothyroidism, unspecified: Secondary | ICD-10-CM | POA: Diagnosis not present

## 2024-05-18 DIAGNOSIS — Z7182 Exercise counseling: Secondary | ICD-10-CM | POA: Diagnosis not present

## 2024-05-18 DIAGNOSIS — Z2821 Immunization not carried out because of patient refusal: Secondary | ICD-10-CM | POA: Diagnosis not present

## 2024-05-18 DIAGNOSIS — F419 Anxiety disorder, unspecified: Secondary | ICD-10-CM | POA: Diagnosis not present

## 2024-05-18 DIAGNOSIS — I119 Hypertensive heart disease without heart failure: Secondary | ICD-10-CM | POA: Diagnosis not present

## 2024-05-18 DIAGNOSIS — Z9181 History of falling: Secondary | ICD-10-CM | POA: Diagnosis not present

## 2024-05-18 DIAGNOSIS — R7303 Prediabetes: Secondary | ICD-10-CM | POA: Diagnosis not present

## 2024-05-18 DIAGNOSIS — I10 Essential (primary) hypertension: Secondary | ICD-10-CM | POA: Diagnosis not present

## 2024-05-18 DIAGNOSIS — F32A Depression, unspecified: Secondary | ICD-10-CM | POA: Diagnosis not present

## 2024-05-18 DIAGNOSIS — Z6829 Body mass index (BMI) 29.0-29.9, adult: Secondary | ICD-10-CM | POA: Diagnosis not present

## 2024-05-18 DIAGNOSIS — E876 Hypokalemia: Secondary | ICD-10-CM | POA: Diagnosis not present

## 2024-05-18 DIAGNOSIS — Z713 Dietary counseling and surveillance: Secondary | ICD-10-CM | POA: Diagnosis not present

## 2024-05-24 ENCOUNTER — Telehealth: Payer: Self-pay | Admitting: Student

## 2024-05-24 NOTE — Telephone Encounter (Signed)
 Pt called 3xs and letter mailed. No response from pt. Deleting recall.

## 2024-06-04 ENCOUNTER — Other Ambulatory Visit: Payer: Self-pay | Admitting: Cardiology

## 2024-06-04 DIAGNOSIS — I4819 Other persistent atrial fibrillation: Secondary | ICD-10-CM

## 2024-06-06 NOTE — Telephone Encounter (Signed)
 Prescription refill request for Eliquis  received. Indication: AF Last office visit: 03/19/23  B Strader PA-C Scr: 1.25 on 04/20/23  Epic Age: 83 Weight: 79.9kg  Based on above findings Eliquis  5mg  twice daily is the appropriate dose.  Pt is past due for appt and lab work.  Message sent to schedulers to make appt.  Refill approved x 2.

## 2024-06-16 DIAGNOSIS — E876 Hypokalemia: Secondary | ICD-10-CM | POA: Diagnosis not present

## 2024-06-16 DIAGNOSIS — E039 Hypothyroidism, unspecified: Secondary | ICD-10-CM | POA: Diagnosis not present

## 2024-06-23 DIAGNOSIS — E876 Hypokalemia: Secondary | ICD-10-CM | POA: Diagnosis not present

## 2024-07-05 NOTE — Telephone Encounter (Signed)
 Pt was called 3 times and letter mailed. No contact back from patient.

## 2024-07-06 ENCOUNTER — Other Ambulatory Visit: Payer: Self-pay | Admitting: Gastroenterology

## 2024-07-06 DIAGNOSIS — R1319 Other dysphagia: Secondary | ICD-10-CM

## 2024-07-06 DIAGNOSIS — T18128D Food in esophagus causing other injury, subsequent encounter: Secondary | ICD-10-CM

## 2024-07-06 DIAGNOSIS — T18128A Food in esophagus causing other injury, initial encounter: Secondary | ICD-10-CM

## 2024-07-08 DIAGNOSIS — E876 Hypokalemia: Secondary | ICD-10-CM | POA: Diagnosis not present

## 2024-07-20 DIAGNOSIS — E876 Hypokalemia: Secondary | ICD-10-CM | POA: Diagnosis not present

## 2024-07-25 DIAGNOSIS — Z7182 Exercise counseling: Secondary | ICD-10-CM | POA: Diagnosis not present

## 2024-07-25 DIAGNOSIS — R29898 Other symptoms and signs involving the musculoskeletal system: Secondary | ICD-10-CM | POA: Diagnosis not present

## 2024-07-25 DIAGNOSIS — E876 Hypokalemia: Secondary | ICD-10-CM | POA: Diagnosis not present

## 2024-07-25 DIAGNOSIS — Z713 Dietary counseling and surveillance: Secondary | ICD-10-CM | POA: Diagnosis not present

## 2024-07-25 DIAGNOSIS — D631 Anemia in chronic kidney disease: Secondary | ICD-10-CM | POA: Diagnosis not present

## 2024-07-25 DIAGNOSIS — N1832 Chronic kidney disease, stage 3b: Secondary | ICD-10-CM | POA: Diagnosis not present

## 2024-07-25 DIAGNOSIS — E039 Hypothyroidism, unspecified: Secondary | ICD-10-CM | POA: Diagnosis not present

## 2024-07-25 DIAGNOSIS — I129 Hypertensive chronic kidney disease with stage 1 through stage 4 chronic kidney disease, or unspecified chronic kidney disease: Secondary | ICD-10-CM | POA: Diagnosis not present

## 2024-07-25 DIAGNOSIS — R7303 Prediabetes: Secondary | ICD-10-CM | POA: Diagnosis not present

## 2024-07-25 DIAGNOSIS — F32A Depression, unspecified: Secondary | ICD-10-CM | POA: Diagnosis not present

## 2024-07-25 DIAGNOSIS — F419 Anxiety disorder, unspecified: Secondary | ICD-10-CM | POA: Diagnosis not present

## 2024-07-25 DIAGNOSIS — I10 Essential (primary) hypertension: Secondary | ICD-10-CM | POA: Diagnosis not present

## 2024-08-09 ENCOUNTER — Other Ambulatory Visit: Payer: Self-pay | Admitting: Cardiology

## 2024-08-09 DIAGNOSIS — I4819 Other persistent atrial fibrillation: Secondary | ICD-10-CM

## 2024-08-09 NOTE — Telephone Encounter (Signed)
 Prescription refill request for Eliquis  received. Indication:afib Last office visit:needs appt Scr: Age:  Weight:  Prescription refilled

## 2024-08-17 ENCOUNTER — Other Ambulatory Visit: Payer: Self-pay | Admitting: Cardiology

## 2024-08-24 DIAGNOSIS — I129 Hypertensive chronic kidney disease with stage 1 through stage 4 chronic kidney disease, or unspecified chronic kidney disease: Secondary | ICD-10-CM | POA: Diagnosis not present

## 2024-08-24 DIAGNOSIS — R7303 Prediabetes: Secondary | ICD-10-CM | POA: Diagnosis not present

## 2024-08-24 DIAGNOSIS — N1832 Chronic kidney disease, stage 3b: Secondary | ICD-10-CM | POA: Diagnosis not present

## 2024-08-24 DIAGNOSIS — M25562 Pain in left knee: Secondary | ICD-10-CM | POA: Diagnosis not present

## 2024-08-24 DIAGNOSIS — I10 Essential (primary) hypertension: Secondary | ICD-10-CM | POA: Diagnosis not present

## 2024-08-24 DIAGNOSIS — E876 Hypokalemia: Secondary | ICD-10-CM | POA: Diagnosis not present

## 2024-08-24 DIAGNOSIS — G629 Polyneuropathy, unspecified: Secondary | ICD-10-CM | POA: Diagnosis not present

## 2024-08-24 DIAGNOSIS — M25561 Pain in right knee: Secondary | ICD-10-CM | POA: Diagnosis not present

## 2024-08-24 DIAGNOSIS — F32A Depression, unspecified: Secondary | ICD-10-CM | POA: Diagnosis not present

## 2024-08-24 DIAGNOSIS — E039 Hypothyroidism, unspecified: Secondary | ICD-10-CM | POA: Diagnosis not present

## 2024-08-24 DIAGNOSIS — F419 Anxiety disorder, unspecified: Secondary | ICD-10-CM | POA: Diagnosis not present

## 2024-09-15 ENCOUNTER — Other Ambulatory Visit: Payer: Self-pay | Admitting: Cardiology

## 2024-10-14 ENCOUNTER — Other Ambulatory Visit: Payer: Self-pay | Admitting: Cardiology
